# Patient Record
Sex: Female | Born: 1970
Health system: Southern US, Community
[De-identification: ages and names within clinical notes are randomized; demographics above are authoritative.]

## PROBLEM LIST (undated history)

## (undated) DIAGNOSIS — R0602 Shortness of breath: Secondary | ICD-10-CM

## (undated) DIAGNOSIS — Z8614 Personal history of Methicillin resistant Staphylococcus aureus infection: Secondary | ICD-10-CM

## (undated) DIAGNOSIS — T4145XA Adverse effect of unspecified anesthetic, initial encounter: Secondary | ICD-10-CM

## (undated) DIAGNOSIS — A6009 Herpesviral infection of other urogenital tract: Secondary | ICD-10-CM

## (undated) DIAGNOSIS — T8859XA Other complications of anesthesia, initial encounter: Secondary | ICD-10-CM

## (undated) DIAGNOSIS — R112 Nausea with vomiting, unspecified: Secondary | ICD-10-CM

## (undated) DIAGNOSIS — D869 Sarcoidosis, unspecified: Secondary | ICD-10-CM

## (undated) DIAGNOSIS — Z9889 Other specified postprocedural states: Secondary | ICD-10-CM

## (undated) DIAGNOSIS — R011 Cardiac murmur, unspecified: Secondary | ICD-10-CM

## (undated) DIAGNOSIS — M1990 Unspecified osteoarthritis, unspecified site: Secondary | ICD-10-CM

## (undated) DIAGNOSIS — E739 Lactose intolerance, unspecified: Secondary | ICD-10-CM

## (undated) DIAGNOSIS — K219 Gastro-esophageal reflux disease without esophagitis: Secondary | ICD-10-CM

## (undated) DIAGNOSIS — D649 Anemia, unspecified: Secondary | ICD-10-CM

## (undated) HISTORY — DX: Sarcoidosis, unspecified: D86.9

## (undated) HISTORY — PX: COLONOSCOPY: SHX174

## (undated) HISTORY — DX: Anemia, unspecified: D64.9

## (undated) HISTORY — DX: Shortness of breath: R06.02

## (undated) HISTORY — DX: Unspecified osteoarthritis, unspecified site: M19.90

## (undated) HISTORY — PX: TUBAL LIGATION: SHX77

## (undated) HISTORY — DX: Cardiac murmur, unspecified: R01.1

## (undated) HISTORY — DX: Herpesviral infection of other urogenital tract: A60.09

## (undated) HISTORY — DX: Personal history of Methicillin resistant Staphylococcus aureus infection: Z86.14

## (undated) HISTORY — DX: Lactose intolerance, unspecified: E73.9

## (undated) HISTORY — DX: Gastro-esophageal reflux disease without esophagitis: K21.9

---
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
## 1991-03-06 DIAGNOSIS — D869 Sarcoidosis, unspecified: Secondary | ICD-10-CM

## 1991-03-06 HISTORY — PX: LYMPH NODE BIOPSY: SHX201

## 1991-03-06 HISTORY — DX: Sarcoidosis, unspecified: D86.9

## 1997-06-10 ENCOUNTER — Encounter: Admission: RE | Admit: 1997-06-10 | Discharge: 1997-06-10 | Payer: Self-pay | Admitting: Hematology and Oncology

## 1997-07-12 ENCOUNTER — Encounter: Admission: RE | Admit: 1997-07-12 | Discharge: 1997-07-12 | Payer: Self-pay | Admitting: Internal Medicine

## 1997-07-12 ENCOUNTER — Other Ambulatory Visit: Admission: RE | Admit: 1997-07-12 | Discharge: 1997-07-12 | Payer: Self-pay

## 1997-08-30 ENCOUNTER — Encounter: Admission: RE | Admit: 1997-08-30 | Discharge: 1997-08-30 | Payer: Self-pay | Admitting: Internal Medicine

## 1997-11-05 ENCOUNTER — Encounter: Admission: RE | Admit: 1997-11-05 | Discharge: 1997-11-05 | Payer: Self-pay | Admitting: Internal Medicine

## 1998-01-18 ENCOUNTER — Encounter: Admission: RE | Admit: 1998-01-18 | Discharge: 1998-01-18 | Payer: Self-pay | Admitting: Hematology and Oncology

## 1998-04-08 ENCOUNTER — Encounter: Payer: Self-pay | Admitting: Emergency Medicine

## 1998-04-08 ENCOUNTER — Emergency Department (HOSPITAL_COMMUNITY): Admission: EM | Admit: 1998-04-08 | Discharge: 1998-04-08 | Payer: Self-pay | Admitting: Emergency Medicine

## 1998-04-10 ENCOUNTER — Emergency Department (HOSPITAL_COMMUNITY): Admission: EM | Admit: 1998-04-10 | Discharge: 1998-04-10 | Payer: Self-pay | Admitting: Emergency Medicine

## 1998-04-11 ENCOUNTER — Emergency Department (HOSPITAL_COMMUNITY): Admission: EM | Admit: 1998-04-11 | Discharge: 1998-04-11 | Payer: Self-pay | Admitting: Internal Medicine

## 1998-04-12 ENCOUNTER — Inpatient Hospital Stay (HOSPITAL_COMMUNITY): Admission: EM | Admit: 1998-04-12 | Discharge: 1998-04-14 | Payer: Self-pay | Admitting: Emergency Medicine

## 1998-04-15 ENCOUNTER — Encounter (HOSPITAL_COMMUNITY): Admission: RE | Admit: 1998-04-15 | Discharge: 1998-07-14 | Payer: Self-pay

## 1998-04-28 ENCOUNTER — Encounter: Admission: RE | Admit: 1998-04-28 | Discharge: 1998-04-28 | Payer: Self-pay | Admitting: Hematology and Oncology

## 1998-05-04 ENCOUNTER — Encounter: Admission: RE | Admit: 1998-05-04 | Discharge: 1998-05-04 | Payer: Self-pay | Admitting: Internal Medicine

## 1998-05-06 ENCOUNTER — Encounter: Admission: RE | Admit: 1998-05-06 | Discharge: 1998-05-06 | Payer: Self-pay | Admitting: Internal Medicine

## 1998-05-31 ENCOUNTER — Encounter: Admission: RE | Admit: 1998-05-31 | Discharge: 1998-05-31 | Payer: Self-pay | Admitting: Internal Medicine

## 1998-06-09 ENCOUNTER — Encounter: Admission: RE | Admit: 1998-06-09 | Discharge: 1998-06-09 | Payer: Self-pay | Admitting: Internal Medicine

## 1998-07-26 ENCOUNTER — Encounter: Admission: RE | Admit: 1998-07-26 | Discharge: 1998-07-26 | Payer: Self-pay | Admitting: Internal Medicine

## 1998-08-12 ENCOUNTER — Encounter: Admission: RE | Admit: 1998-08-12 | Discharge: 1998-08-12 | Payer: Self-pay | Admitting: Internal Medicine

## 1998-08-30 ENCOUNTER — Encounter: Admission: RE | Admit: 1998-08-30 | Discharge: 1998-11-28 | Payer: Self-pay

## 1998-09-15 ENCOUNTER — Encounter: Admission: RE | Admit: 1998-09-15 | Discharge: 1998-09-15 | Payer: Self-pay | Admitting: Internal Medicine

## 1998-10-06 ENCOUNTER — Encounter: Admission: RE | Admit: 1998-10-06 | Discharge: 1998-10-06 | Payer: Self-pay | Admitting: Hematology and Oncology

## 1998-10-06 ENCOUNTER — Other Ambulatory Visit: Admission: RE | Admit: 1998-10-06 | Discharge: 1998-10-06 | Payer: Self-pay | Admitting: *Deleted

## 1998-11-01 ENCOUNTER — Encounter: Admission: RE | Admit: 1998-11-01 | Discharge: 1998-11-01 | Payer: Self-pay | Admitting: Hematology and Oncology

## 1999-02-09 ENCOUNTER — Encounter: Admission: RE | Admit: 1999-02-09 | Discharge: 1999-02-09 | Payer: Self-pay | Admitting: Hematology and Oncology

## 1999-04-07 ENCOUNTER — Encounter: Admission: RE | Admit: 1999-04-07 | Discharge: 1999-04-07 | Payer: Self-pay | Admitting: Internal Medicine

## 1999-04-20 ENCOUNTER — Encounter: Admission: RE | Admit: 1999-04-20 | Discharge: 1999-04-20 | Payer: Self-pay | Admitting: Internal Medicine

## 1999-05-04 ENCOUNTER — Encounter: Admission: RE | Admit: 1999-05-04 | Discharge: 1999-05-04 | Payer: Self-pay | Admitting: Hematology and Oncology

## 1999-10-06 ENCOUNTER — Encounter: Admission: RE | Admit: 1999-10-06 | Discharge: 1999-10-06 | Payer: Self-pay | Admitting: Internal Medicine

## 1999-11-02 ENCOUNTER — Encounter: Admission: RE | Admit: 1999-11-02 | Discharge: 1999-11-02 | Payer: Self-pay | Admitting: Occupational Medicine

## 1999-11-02 ENCOUNTER — Encounter: Payer: Self-pay | Admitting: Occupational Medicine

## 1999-11-23 ENCOUNTER — Encounter: Admission: RE | Admit: 1999-11-23 | Discharge: 1999-11-23 | Payer: Self-pay | Admitting: Internal Medicine

## 2000-01-12 ENCOUNTER — Other Ambulatory Visit: Admission: RE | Admit: 2000-01-12 | Discharge: 2000-01-12 | Payer: Self-pay | Admitting: *Deleted

## 2000-01-12 ENCOUNTER — Encounter: Admission: RE | Admit: 2000-01-12 | Discharge: 2000-01-12 | Payer: Self-pay

## 2000-03-25 ENCOUNTER — Encounter: Admission: RE | Admit: 2000-03-25 | Discharge: 2000-03-25 | Payer: Self-pay | Admitting: Internal Medicine

## 2000-06-11 ENCOUNTER — Encounter: Admission: RE | Admit: 2000-06-11 | Discharge: 2000-06-11 | Payer: Self-pay | Admitting: Hematology and Oncology

## 2000-06-14 ENCOUNTER — Ambulatory Visit (HOSPITAL_COMMUNITY): Admission: RE | Admit: 2000-06-14 | Discharge: 2000-06-14 | Payer: Self-pay

## 2000-06-14 ENCOUNTER — Encounter: Admission: RE | Admit: 2000-06-14 | Discharge: 2000-06-14 | Payer: Self-pay

## 2000-09-07 ENCOUNTER — Emergency Department (HOSPITAL_COMMUNITY): Admission: EM | Admit: 2000-09-07 | Discharge: 2000-09-07 | Payer: Self-pay | Admitting: Emergency Medicine

## 2000-09-13 ENCOUNTER — Encounter: Admission: RE | Admit: 2000-09-13 | Discharge: 2000-09-13 | Payer: Self-pay | Admitting: Internal Medicine

## 2000-09-30 ENCOUNTER — Encounter: Admission: RE | Admit: 2000-09-30 | Discharge: 2000-09-30 | Payer: Self-pay | Admitting: Internal Medicine

## 2000-10-07 ENCOUNTER — Encounter: Admission: RE | Admit: 2000-10-07 | Discharge: 2000-10-07 | Payer: Self-pay | Admitting: Internal Medicine

## 2000-10-17 ENCOUNTER — Encounter: Admission: RE | Admit: 2000-10-17 | Discharge: 2000-10-17 | Payer: Self-pay | Admitting: Internal Medicine

## 2000-11-12 ENCOUNTER — Encounter: Admission: RE | Admit: 2000-11-12 | Discharge: 2000-11-12 | Payer: Self-pay | Admitting: Pediatrics

## 2001-01-23 ENCOUNTER — Encounter: Admission: RE | Admit: 2001-01-23 | Discharge: 2001-01-23 | Payer: Self-pay | Admitting: Internal Medicine

## 2001-01-23 ENCOUNTER — Other Ambulatory Visit: Admission: RE | Admit: 2001-01-23 | Discharge: 2001-01-23 | Payer: Self-pay

## 2001-02-10 ENCOUNTER — Encounter: Admission: RE | Admit: 2001-02-10 | Discharge: 2001-02-10 | Payer: Self-pay | Admitting: Internal Medicine

## 2001-02-13 ENCOUNTER — Encounter: Admission: RE | Admit: 2001-02-13 | Discharge: 2001-02-13 | Payer: Self-pay | Admitting: Internal Medicine

## 2001-03-19 ENCOUNTER — Encounter: Admission: RE | Admit: 2001-03-19 | Discharge: 2001-03-19 | Payer: Self-pay

## 2001-04-22 ENCOUNTER — Encounter: Admission: RE | Admit: 2001-04-22 | Discharge: 2001-04-22 | Payer: Self-pay | Admitting: Internal Medicine

## 2001-05-01 ENCOUNTER — Encounter: Payer: Self-pay | Admitting: Gastroenterology

## 2001-05-01 ENCOUNTER — Encounter: Admission: RE | Admit: 2001-05-01 | Discharge: 2001-05-01 | Payer: Self-pay | Admitting: Gastroenterology

## 2001-06-03 ENCOUNTER — Emergency Department (HOSPITAL_COMMUNITY): Admission: EM | Admit: 2001-06-03 | Discharge: 2001-06-03 | Payer: Self-pay | Admitting: Emergency Medicine

## 2001-06-03 ENCOUNTER — Encounter: Payer: Self-pay | Admitting: Emergency Medicine

## 2001-06-06 ENCOUNTER — Encounter: Admission: RE | Admit: 2001-06-06 | Discharge: 2001-06-06 | Payer: Self-pay | Admitting: Internal Medicine

## 2001-06-10 ENCOUNTER — Encounter: Admission: RE | Admit: 2001-06-10 | Discharge: 2001-06-10 | Payer: Self-pay

## 2001-07-02 ENCOUNTER — Encounter: Admission: RE | Admit: 2001-07-02 | Discharge: 2001-07-02 | Payer: Self-pay | Admitting: *Deleted

## 2001-07-22 ENCOUNTER — Encounter: Admission: RE | Admit: 2001-07-22 | Discharge: 2001-07-22 | Payer: Self-pay | Admitting: Family Medicine

## 2001-07-22 ENCOUNTER — Encounter: Payer: Self-pay | Admitting: Family Medicine

## 2001-07-24 ENCOUNTER — Encounter: Admission: RE | Admit: 2001-07-24 | Discharge: 2001-07-31 | Payer: Self-pay | Admitting: Occupational Medicine

## 2001-08-18 ENCOUNTER — Ambulatory Visit (HOSPITAL_COMMUNITY): Admission: RE | Admit: 2001-08-18 | Discharge: 2001-08-18 | Payer: Self-pay | Admitting: Gastroenterology

## 2001-08-18 ENCOUNTER — Encounter (INDEPENDENT_AMBULATORY_CARE_PROVIDER_SITE_OTHER): Payer: Self-pay | Admitting: *Deleted

## 2001-08-29 ENCOUNTER — Encounter: Admission: RE | Admit: 2001-08-29 | Discharge: 2001-08-29 | Payer: Self-pay | Admitting: Internal Medicine

## 2002-01-05 ENCOUNTER — Encounter: Admission: RE | Admit: 2002-01-05 | Discharge: 2002-01-05 | Payer: Self-pay | Admitting: Internal Medicine

## 2002-02-10 ENCOUNTER — Encounter: Admission: RE | Admit: 2002-02-10 | Discharge: 2002-02-10 | Payer: Self-pay | Admitting: Internal Medicine

## 2002-04-27 ENCOUNTER — Encounter: Admission: RE | Admit: 2002-04-27 | Discharge: 2002-04-27 | Payer: Self-pay | Admitting: Internal Medicine

## 2002-09-22 ENCOUNTER — Encounter: Admission: RE | Admit: 2002-09-22 | Discharge: 2002-09-22 | Payer: Self-pay | Admitting: Occupational Medicine

## 2002-09-22 ENCOUNTER — Encounter: Payer: Self-pay | Admitting: Occupational Medicine

## 2002-12-11 ENCOUNTER — Encounter: Admission: RE | Admit: 2002-12-11 | Discharge: 2002-12-11 | Payer: Self-pay | Admitting: Internal Medicine

## 2003-04-05 ENCOUNTER — Encounter: Admission: RE | Admit: 2003-04-05 | Discharge: 2003-04-05 | Payer: Self-pay | Admitting: Internal Medicine

## 2003-05-06 ENCOUNTER — Encounter: Admission: RE | Admit: 2003-05-06 | Discharge: 2003-05-06 | Payer: Self-pay | Admitting: Internal Medicine

## 2003-07-22 ENCOUNTER — Encounter: Admission: RE | Admit: 2003-07-22 | Discharge: 2003-07-22 | Payer: Self-pay | Admitting: Gastroenterology

## 2003-09-22 ENCOUNTER — Emergency Department (HOSPITAL_COMMUNITY): Admission: EM | Admit: 2003-09-22 | Discharge: 2003-09-22 | Payer: Self-pay | Admitting: Family Medicine

## 2003-10-12 ENCOUNTER — Ambulatory Visit (HOSPITAL_COMMUNITY): Admission: RE | Admit: 2003-10-12 | Discharge: 2003-10-12 | Payer: Self-pay | Admitting: Gastroenterology

## 2003-10-18 ENCOUNTER — Encounter: Admission: RE | Admit: 2003-10-18 | Discharge: 2003-10-18 | Payer: Self-pay | Admitting: Internal Medicine

## 2003-11-16 ENCOUNTER — Ambulatory Visit: Payer: Self-pay | Admitting: Internal Medicine

## 2003-12-28 ENCOUNTER — Ambulatory Visit: Payer: Self-pay | Admitting: Internal Medicine

## 2004-03-20 ENCOUNTER — Emergency Department (HOSPITAL_COMMUNITY): Admission: EM | Admit: 2004-03-20 | Discharge: 2004-03-20 | Payer: Self-pay | Admitting: Family Medicine

## 2004-04-13 ENCOUNTER — Ambulatory Visit: Payer: Self-pay | Admitting: Internal Medicine

## 2004-04-17 ENCOUNTER — Ambulatory Visit (HOSPITAL_COMMUNITY): Admission: RE | Admit: 2004-04-17 | Discharge: 2004-04-17 | Payer: Self-pay | Admitting: Orthopaedic Surgery

## 2004-08-03 ENCOUNTER — Ambulatory Visit: Payer: Self-pay | Admitting: Internal Medicine

## 2004-08-24 ENCOUNTER — Encounter: Admission: RE | Admit: 2004-08-24 | Discharge: 2004-08-24 | Payer: Self-pay | Admitting: Occupational Medicine

## 2004-09-20 ENCOUNTER — Ambulatory Visit: Payer: Self-pay | Admitting: Internal Medicine

## 2005-03-29 ENCOUNTER — Ambulatory Visit: Payer: Self-pay | Admitting: Internal Medicine

## 2005-03-30 ENCOUNTER — Encounter (INDEPENDENT_AMBULATORY_CARE_PROVIDER_SITE_OTHER): Payer: Self-pay | Admitting: Infectious Diseases

## 2005-04-02 ENCOUNTER — Ambulatory Visit (HOSPITAL_COMMUNITY): Admission: RE | Admit: 2005-04-02 | Discharge: 2005-04-02 | Payer: Self-pay | Admitting: Pulmonary Disease

## 2005-04-04 ENCOUNTER — Ambulatory Visit: Payer: Self-pay | Admitting: Internal Medicine

## 2005-08-15 ENCOUNTER — Ambulatory Visit: Payer: Self-pay | Admitting: Internal Medicine

## 2005-12-16 ENCOUNTER — Emergency Department (HOSPITAL_COMMUNITY): Admission: EM | Admit: 2005-12-16 | Discharge: 2005-12-16 | Payer: Self-pay | Admitting: Emergency Medicine

## 2006-01-11 ENCOUNTER — Encounter (INDEPENDENT_AMBULATORY_CARE_PROVIDER_SITE_OTHER): Payer: Self-pay | Admitting: Unknown Physician Specialty

## 2006-01-11 ENCOUNTER — Ambulatory Visit: Payer: Self-pay | Admitting: Internal Medicine

## 2006-01-11 LAB — CONVERTED CEMR LAB: Candida species: POSITIVE — AB

## 2006-01-20 ENCOUNTER — Emergency Department (HOSPITAL_COMMUNITY): Admission: EM | Admit: 2006-01-20 | Discharge: 2006-01-20 | Payer: Self-pay | Admitting: Emergency Medicine

## 2006-04-03 ENCOUNTER — Ambulatory Visit: Payer: Self-pay | Admitting: Internal Medicine

## 2006-04-03 ENCOUNTER — Encounter (INDEPENDENT_AMBULATORY_CARE_PROVIDER_SITE_OTHER): Payer: Self-pay | Admitting: Infectious Diseases

## 2006-04-03 DIAGNOSIS — M7710 Lateral epicondylitis, unspecified elbow: Secondary | ICD-10-CM | POA: Insufficient documentation

## 2006-04-03 DIAGNOSIS — F3289 Other specified depressive episodes: Secondary | ICD-10-CM | POA: Insufficient documentation

## 2006-04-03 DIAGNOSIS — G4700 Insomnia, unspecified: Secondary | ICD-10-CM | POA: Insufficient documentation

## 2006-04-03 DIAGNOSIS — Z8719 Personal history of other diseases of the digestive system: Secondary | ICD-10-CM | POA: Insufficient documentation

## 2006-04-03 DIAGNOSIS — F17200 Nicotine dependence, unspecified, uncomplicated: Secondary | ICD-10-CM | POA: Insufficient documentation

## 2006-04-03 DIAGNOSIS — D649 Anemia, unspecified: Secondary | ICD-10-CM | POA: Insufficient documentation

## 2006-04-03 DIAGNOSIS — G44209 Tension-type headache, unspecified, not intractable: Secondary | ICD-10-CM | POA: Insufficient documentation

## 2006-04-03 DIAGNOSIS — D869 Sarcoidosis, unspecified: Secondary | ICD-10-CM | POA: Insufficient documentation

## 2006-04-03 DIAGNOSIS — F329 Major depressive disorder, single episode, unspecified: Secondary | ICD-10-CM | POA: Insufficient documentation

## 2006-04-03 DIAGNOSIS — K449 Diaphragmatic hernia without obstruction or gangrene: Secondary | ICD-10-CM | POA: Insufficient documentation

## 2006-04-03 LAB — CONVERTED CEMR LAB
ALT: 13 units/L (ref 0–35)
AST: 26 units/L (ref 0–37)
Alkaline Phosphatase: 40 units/L (ref 39–117)
Creatinine, Ser: 0.89 mg/dL (ref 0.40–1.20)
HCT: 38.6 % (ref 36.0–46.0)
MCHC: 30.6 g/dL (ref 30.0–36.0)
MCV: 83 fL (ref 78.0–100.0)
Platelets: 439 10*3/uL — ABNORMAL HIGH (ref 150–400)
RDW: 15.4 % — ABNORMAL HIGH (ref 11.5–14.0)
Total Bilirubin: 0.3 mg/dL (ref 0.3–1.2)

## 2006-06-03 ENCOUNTER — Encounter: Payer: Self-pay | Admitting: Internal Medicine

## 2006-06-03 ENCOUNTER — Ambulatory Visit (HOSPITAL_COMMUNITY): Admission: RE | Admit: 2006-06-03 | Discharge: 2006-06-03 | Payer: Self-pay | Admitting: Family Medicine

## 2006-08-08 ENCOUNTER — Telehealth (INDEPENDENT_AMBULATORY_CARE_PROVIDER_SITE_OTHER): Payer: Self-pay | Admitting: Pharmacy Technician

## 2006-08-27 ENCOUNTER — Ambulatory Visit: Payer: Self-pay | Admitting: Internal Medicine

## 2006-08-27 DIAGNOSIS — J351 Hypertrophy of tonsils: Secondary | ICD-10-CM | POA: Insufficient documentation

## 2007-01-17 ENCOUNTER — Telehealth (INDEPENDENT_AMBULATORY_CARE_PROVIDER_SITE_OTHER): Payer: Self-pay | Admitting: Infectious Diseases

## 2007-03-06 DIAGNOSIS — Z8614 Personal history of Methicillin resistant Staphylococcus aureus infection: Secondary | ICD-10-CM

## 2007-03-06 HISTORY — DX: Personal history of Methicillin resistant Staphylococcus aureus infection: Z86.14

## 2007-04-10 ENCOUNTER — Telehealth (INDEPENDENT_AMBULATORY_CARE_PROVIDER_SITE_OTHER): Payer: Self-pay | Admitting: Infectious Diseases

## 2007-04-28 ENCOUNTER — Encounter: Payer: Self-pay | Admitting: Internal Medicine

## 2007-04-28 ENCOUNTER — Ambulatory Visit: Payer: Self-pay | Admitting: Internal Medicine

## 2007-04-28 DIAGNOSIS — E049 Nontoxic goiter, unspecified: Secondary | ICD-10-CM | POA: Insufficient documentation

## 2007-04-28 DIAGNOSIS — R42 Dizziness and giddiness: Secondary | ICD-10-CM | POA: Insufficient documentation

## 2007-04-28 LAB — CONVERTED CEMR LAB
Eosinophils Absolute: 0 10*3/uL (ref 0.0–0.7)
Eosinophils Relative: 1 % (ref 0–5)
HCT: 36 % (ref 36.0–46.0)
Lymphs Abs: 1.9 10*3/uL (ref 0.7–4.0)
MCV: 83.9 fL (ref 78.0–100.0)
Platelets: 491 10*3/uL — ABNORMAL HIGH (ref 150–400)
TSH: 1.163 microintl units/mL (ref 0.350–5.50)
WBC: 5.7 10*3/uL (ref 4.0–10.5)

## 2007-05-19 ENCOUNTER — Emergency Department (HOSPITAL_COMMUNITY): Admission: EM | Admit: 2007-05-19 | Discharge: 2007-05-19 | Payer: Self-pay | Admitting: Emergency Medicine

## 2007-05-20 ENCOUNTER — Other Ambulatory Visit: Admission: RE | Admit: 2007-05-20 | Discharge: 2007-05-20 | Payer: Self-pay | Admitting: Obstetrics and Gynecology

## 2007-06-02 ENCOUNTER — Telehealth (INDEPENDENT_AMBULATORY_CARE_PROVIDER_SITE_OTHER): Payer: Self-pay | Admitting: Infectious Diseases

## 2007-06-06 ENCOUNTER — Telehealth (INDEPENDENT_AMBULATORY_CARE_PROVIDER_SITE_OTHER): Payer: Self-pay | Admitting: Infectious Diseases

## 2007-08-12 ENCOUNTER — Telehealth (INDEPENDENT_AMBULATORY_CARE_PROVIDER_SITE_OTHER): Payer: Self-pay | Admitting: Infectious Diseases

## 2007-12-03 ENCOUNTER — Encounter: Payer: Self-pay | Admitting: Internal Medicine

## 2007-12-10 ENCOUNTER — Ambulatory Visit: Payer: Self-pay | Admitting: Infectious Disease

## 2008-01-15 ENCOUNTER — Other Ambulatory Visit: Admission: RE | Admit: 2008-01-15 | Discharge: 2008-01-15 | Payer: Self-pay | Admitting: Obstetrics and Gynecology

## 2008-02-24 ENCOUNTER — Telehealth: Payer: Self-pay | Admitting: Internal Medicine

## 2008-03-05 DIAGNOSIS — A6009 Herpesviral infection of other urogenital tract: Secondary | ICD-10-CM

## 2008-03-05 HISTORY — DX: Herpesviral infection of other urogenital tract: A60.09

## 2008-11-29 ENCOUNTER — Ambulatory Visit (HOSPITAL_COMMUNITY): Admission: RE | Admit: 2008-11-29 | Discharge: 2008-11-29 | Payer: Self-pay | Admitting: Sports Medicine

## 2009-01-28 IMAGING — CT CT ABD-PELV W/O CM
3 of 8 series · 12 of 42 positions shown, 18 images · IV contrast (CONTRAST)
Comparison: NONE

CLINICAL DATA: Right lower quadrant pain times 2 days. 

CT ABDOMEN AND PELVIS WITHOUT AND WITH INTRAVENOUS AND FOLLOWING 
ORAL  CONTRAST
TECHNIQUE: Multiple axial 5.0-mm-thick slices at 5.0-mm 
intervals were obtained from the lung base through the pelvis 
following the intravenous administration of 125 cc of Optiray 350 
at a rate of 3 cc per second.  Oral contrast was administered as 
well.  Arterial and venous phase imaging was obtained in the upper 
abdomen with delayed images obtained through the pelvis.

[Series 4: venous · axial · portal-venous · 0.67mm/px · z∈[+109,+384]mm · 5 of 83 slices shown, 10 images]
[im 14/83  soft-tissue]
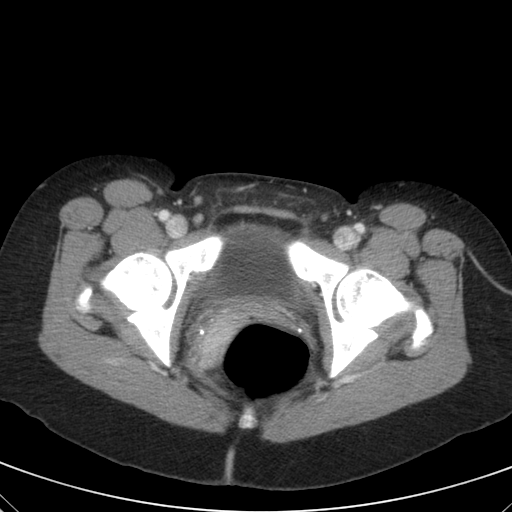
[im 14/83  bone]
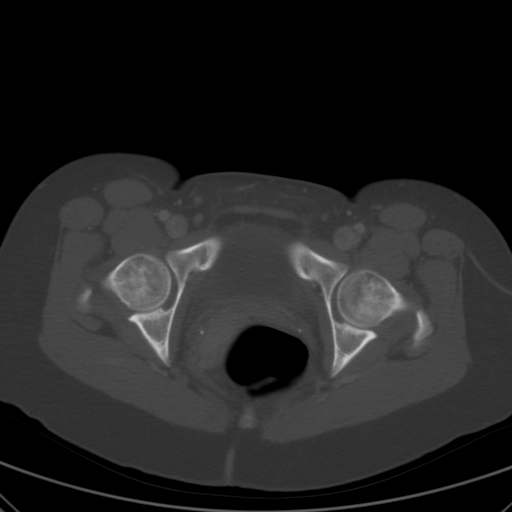
[im 28/83  soft-tissue]
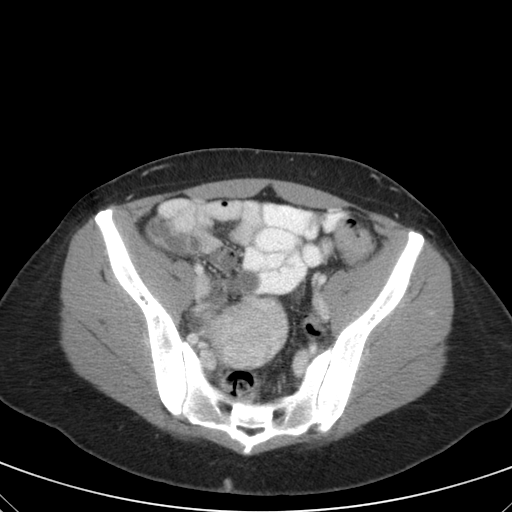
[im 28/83  lung]
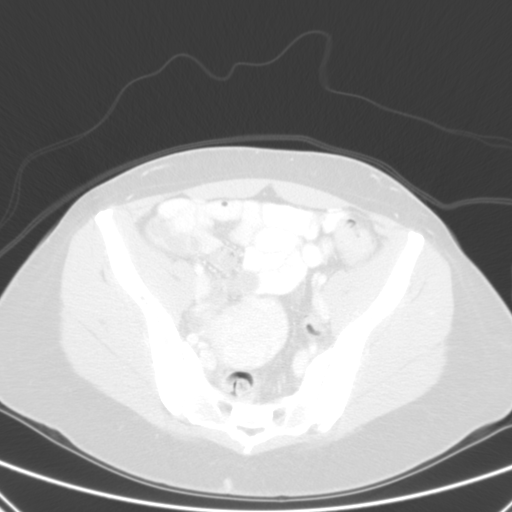
[im 42/83  soft-tissue]
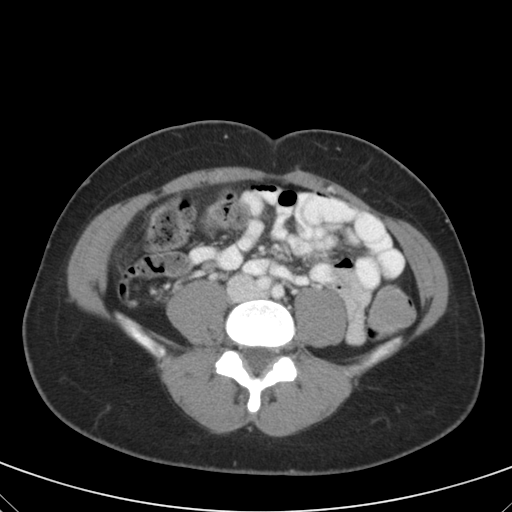
[im 42/83  lung]
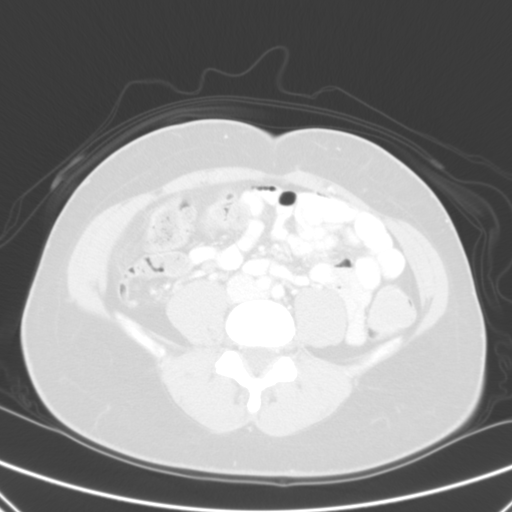
[im 55/83  soft-tissue]
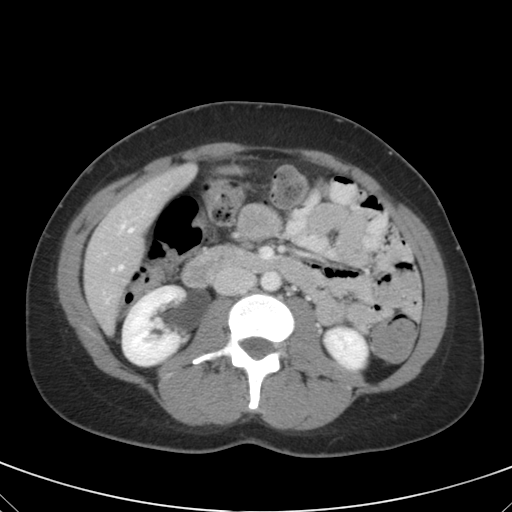
[im 55/83  lung]
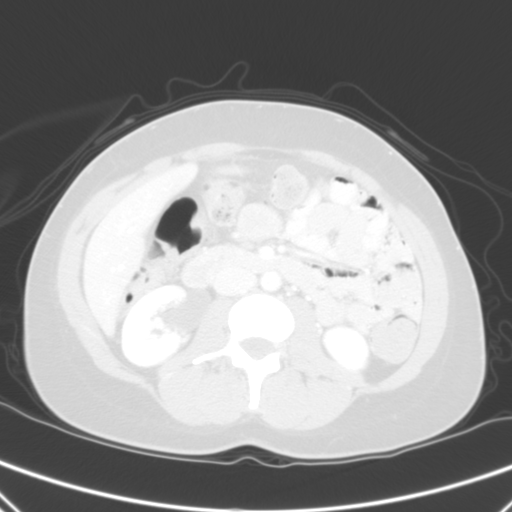
[im 69/83  soft-tissue]
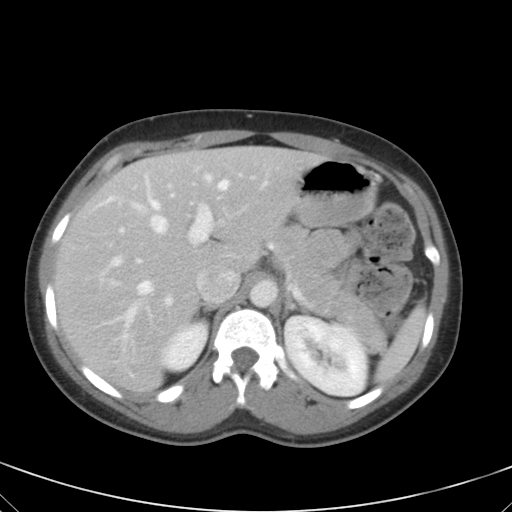
[im 69/83  lung]
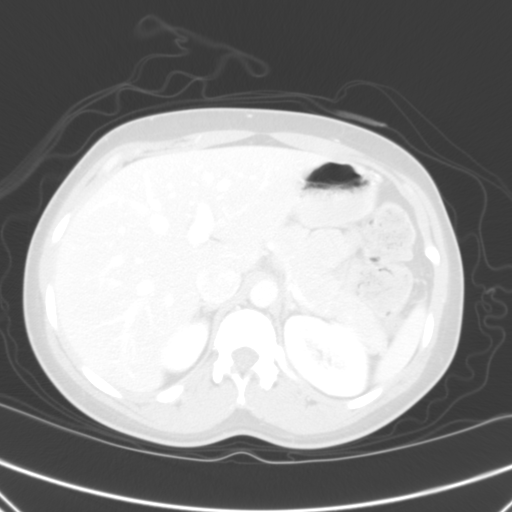

[Series 9: delays · axial · 0.65mm/px · z∈[+145,+340]mm · 4 of 67 slices shown]
[im 14/67  soft-tissue]
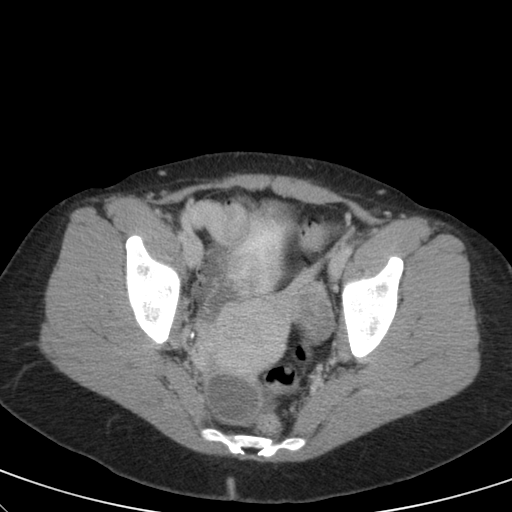
[im 27/67  soft-tissue]
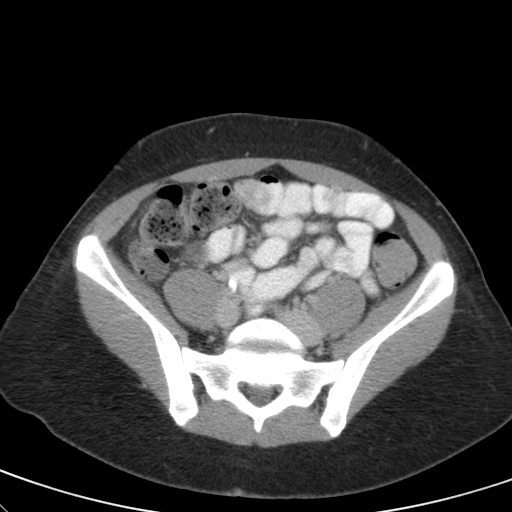
[im 40/67  soft-tissue]
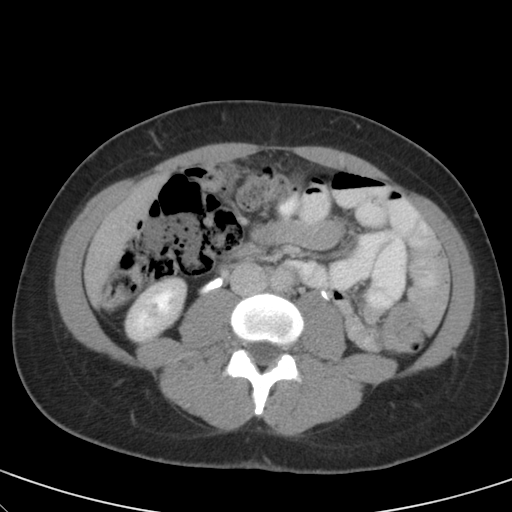
[im 53/67  soft-tissue]
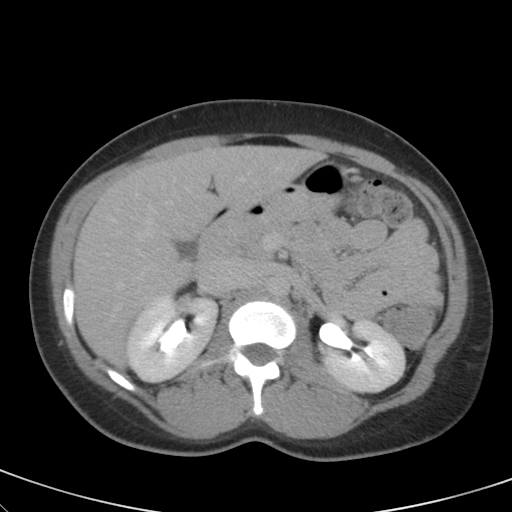

[coronals · coronal · 0.80mm/px · 3 of 70 slices shown, 4 images]
[im 24/70  soft-tissue]
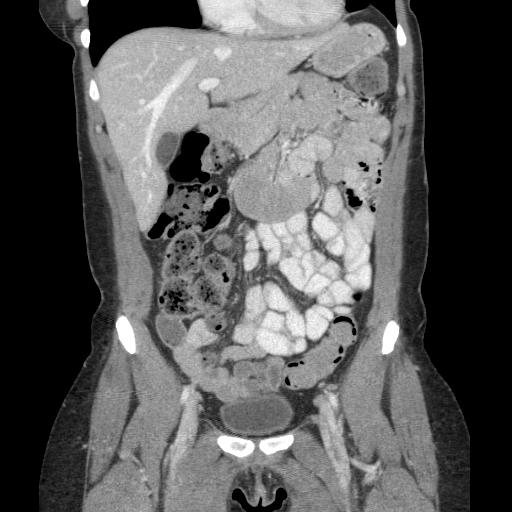
[im 31/70  soft-tissue]
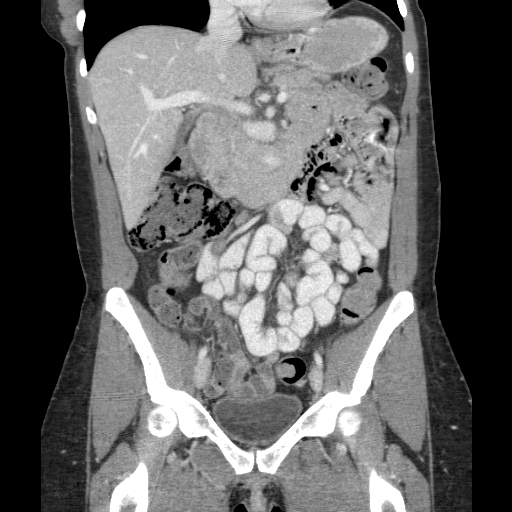
[im 31/70  bone]
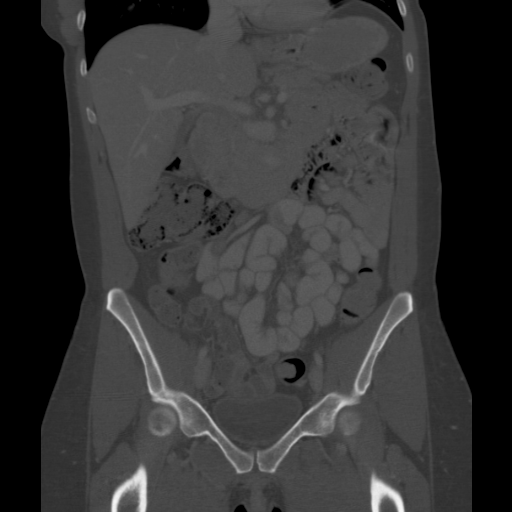
[im 39/70  soft-tissue]
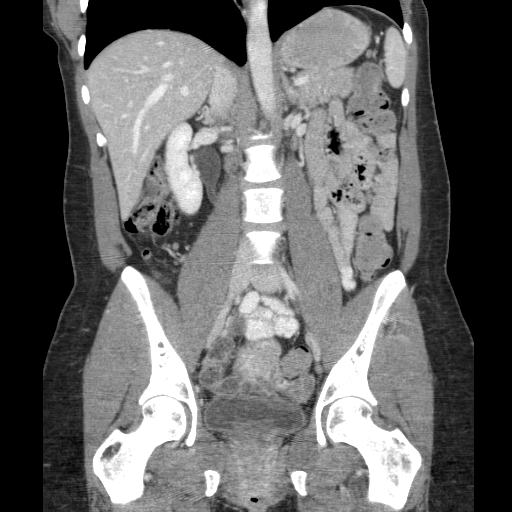

[12 of 42 positions shown; findings below may reference images not displayed]

FINDINGS: No gallstones or renal calculi.  No upper abdominal or 
retroperitoneal mass, adenopathy, or aneurysm.  No adrenal or 
renal mass or evidence of renal obstruction. The liver, pancreas, 
and spleen are unremarkable. No bowel, mesenteric, pelvic, or 
inguinal mass, adenopathy, or inflammatory process except for 
bilateral ovarian cysts; the largest on the right, 3.6 cm and the 
smaller on the left, approximately 7.6x7.4 cm.  No free fluid or 
cul-de-sac masses. No evidence of appendicitis, diverticulitis, 
hernia, or bowel obstruction.
IMPRESSION: Bilateral adnexal cysts, right greater than left.  No 
evidence of free fluid or inflammatory process. Nevison, Ackeno Date:  05/21/2007 DAS  JLM

## 2009-06-29 ENCOUNTER — Encounter (INDEPENDENT_AMBULATORY_CARE_PROVIDER_SITE_OTHER): Payer: Self-pay | Admitting: *Deleted

## 2009-06-29 ENCOUNTER — Telehealth (INDEPENDENT_AMBULATORY_CARE_PROVIDER_SITE_OTHER): Payer: Self-pay | Admitting: *Deleted

## 2010-02-20 ENCOUNTER — Encounter
Admission: RE | Admit: 2010-02-20 | Discharge: 2010-02-20 | Payer: Self-pay | Source: Home / Self Care | Attending: Obstetrics and Gynecology | Admitting: Obstetrics and Gynecology

## 2010-03-25 ENCOUNTER — Encounter: Payer: Self-pay | Admitting: Internal Medicine

## 2010-03-26 ENCOUNTER — Encounter: Payer: Self-pay | Admitting: Family Medicine

## 2010-03-27 ENCOUNTER — Encounter: Payer: Self-pay | Admitting: Infectious Diseases

## 2010-04-04 NOTE — Letter (Signed)
Summary: [facility] RECALL LETTER  All     ,     Phone:   Fax:     06/29/2009   Kathleen Vang [address] [place], [place]  [address]   Dear  [name],   You are due to follow-up with a doctor at the [facility].  We have been unable to contact you by phone.  If you would like to schedule a visit, please call [phone].  If you are receiving your health care somewhere else, please call us and we will take your name off our patient list.  Healthy regards,  Raynaldo Opitz, Director [facility]

## 2010-04-04 NOTE — Progress Notes (Signed)
  Phone Note Outgoing Call   Call placed by: Gentry Fitz,  June 29, 2009 1:57 PM Call placed to: Patient Summary of Call: We attempted to call patient in order to schedule a return appointment, since her last [facility] office visit was more than one year ago.  Since we were unable to reach patient by phone, a letter was sent asking her to contact us.  Initial call taken by: Gentry Fitz,  June 29, 2009 1:57 PM

## 2010-04-24 ENCOUNTER — Ambulatory Visit (HOSPITAL_COMMUNITY)
Admission: RE | Admit: 2010-04-24 | Discharge: 2010-04-24 | Disposition: A | Discharge status: Home / Self Care | Payer: 59 | Source: Ambulatory Visit | Attending: Obstetrics and Gynecology | Admitting: Obstetrics and Gynecology

## 2010-04-24 ENCOUNTER — Other Ambulatory Visit: Payer: Self-pay | Admitting: Obstetrics and Gynecology

## 2010-04-24 DIAGNOSIS — R1032 Left lower quadrant pain: Secondary | ICD-10-CM | POA: Insufficient documentation

## 2010-04-24 DIAGNOSIS — N83209 Unspecified ovarian cyst, unspecified side: Secondary | ICD-10-CM | POA: Insufficient documentation

## 2010-04-24 DIAGNOSIS — R102 Pelvic and perineal pain: Secondary | ICD-10-CM

## 2010-06-17 ENCOUNTER — Inpatient Hospital Stay (INDEPENDENT_AMBULATORY_CARE_PROVIDER_SITE_OTHER)
Admission: RE | Admit: 2010-06-17 | Discharge: 2010-06-17 | Disposition: A | Discharge status: Home / Self Care | Payer: Commercial Managed Care - PPO | Source: Ambulatory Visit | Attending: Family Medicine | Admitting: Family Medicine

## 2010-06-17 DIAGNOSIS — R51 Headache: Secondary | ICD-10-CM

## 2010-07-21 NOTE — Procedures (Signed)
[place]  Patient:    Kathleen Vang, Kathleen Vang Visit Number: [id number] MRN: [id number]          Service Type: END Location: ENDO Attending Physician:  Nelda Marseille Dictated by:   Petra Kuba, M.D. Proc. Date: 08/18/01 Admit Date:  08/18/2001   CC:         Harvel Quale, M.D., [facility]   Procedure Report  PROCEDURE:  Esophagogastroduodenoscopy with biopsy.  INDICATION:  Patient with long-standing reflux.  Want to rule out Barretts. Consent was signed after risks, benefits, methods, options thoroughly discussed in the office.  MEDICATIONS:  Demerol 60 mg, Versed 5 mg.  DESCRIPTION OF PROCEDURE:  The scope was inserted by direct vision.  The proximal and midesophagus was normal.  In the distal esophagus was a small hiatal hernia with minimal reflux changes but no signs of obvious Barretts. This area was biopsied at the end of the procedure.  The scope passed into the stomach and advanced through a normal antrum and normal pylorus into a normal duodenal bulb and around the C-loop to a normal second portion of the duodenum.  The scope was withdrawn back to the bulb, and a good look there ruled out ulcers in that location.  The scope was withdrawn back to the stomach and retroflexed.  The cardia, fundus, angularis, and lesser and greater curve were normal.  The scope was straightened.  Direct visualization of the stomach was normal.  The scope was slowly withdrawn back to 20 cm.  No additional findings were seen.  We then readvanced down to the distal esophagus.  Biopsies were obtained.  Air was suctioned, the scope slowly withdrawn.  Again a good look at the proximal and midesophagus was normal. The scope was removed.  The patient tolerated the procedure well.  There was no obvious immediate complication.  ENDOSCOPIC DIAGNOSES: 1. Small hiatal hernia with minimal reflux change, status post biopsy. 2. Otherwise normal  EGD.  PLAN:  Await pathology.  Continue pump inhibitors.  Happy to see back p.r.n. or in six months. Dictated by:   Petra Kuba, M.D. Attending Physician:  Nelda Marseille DD:  08/18/01 TD:  08/19/01 Job: [id number]

## 2010-07-21 NOTE — Op Note (Signed)
NAME:  Kathleen Vang, Kathleen Vang                           ACCOUNT NO.:  [id number]   MEDICAL RECORD NO.:  [id number]                   PATIENT TYPE:  AMB   LOCATION:  ENDO                                 FACILITY:  [facility]   PHYSICIAN:  Petra Kuba, M.D.                 DATE OF BIRTH:  [birth year]   DATE OF PROCEDURE:  10/12/2003  DATE OF DISCHARGE:  10/12/2003                                 OPERATIVE REPORT   PROCEDURE:  Manometry.   INDICATIONS:  Preoperative hiatal hernia, considering repair.   PROCEDURE:  Manometry was done in the customary fashion in the Endo Unit.  The computer-generated results were submitted to me for my interpretation.   ASSESSMENT:  1. Upper esophageal sphincter had normal pressures and normal residual     pressure; 84% relaxation.  There was a slight decrease in that duration,     with 379 msec versus a normal of 400 msec.  She had normal peak     pharyngeal pressures.  2. Esophageal body:     a. Upper with normal amplitude and duration.     b. Lower with normal amplitude and overall normal duration.  One of the        distal measurements was slightly prolonged minimally.  There was        normal velocity. She did have 70% peristalsis, with 10% simultaneous        waves and 20% retrogrades; but, adequate wave forms.  3. LAS with normal pressure and normal residual pressure, with 87%     relaxation.   IMPRESSION:  Essentially normal manometry with very minimal and without  significant abnormalities.   PLAN:  Okay for surgical options.  We will have her follow up with me p.r.n.  or if she would like to discuss it further, and otherwise with Dr.  Abbey Chatters for any other preop workup and plan.                                               Petra Kuba, M.D.    MEM/MEDQ  D:  10/25/2003  T:  10/25/2003  Job:  [id number]   cc:   Adolph Pollack, M.D.  [address]  [place]  [place] [address]  Fax: [phone]

## 2010-10-27 ENCOUNTER — Other Ambulatory Visit: Payer: Self-pay | Admitting: Obstetrics and Gynecology

## 2010-10-27 ENCOUNTER — Ambulatory Visit (HOSPITAL_COMMUNITY)
Admission: RE | Admit: 2010-10-27 | Discharge: 2010-10-27 | Disposition: A | Discharge status: Home / Self Care | Payer: 59 | Source: Ambulatory Visit | Attending: Obstetrics and Gynecology | Admitting: Obstetrics and Gynecology

## 2010-10-27 DIAGNOSIS — N831 Corpus luteum cyst of ovary, unspecified side: Secondary | ICD-10-CM | POA: Insufficient documentation

## 2010-10-27 DIAGNOSIS — R1031 Right lower quadrant pain: Secondary | ICD-10-CM

## 2010-11-27 LAB — POCT URINALYSIS DIP (DEVICE)
Bilirubin Urine: NEGATIVE
Glucose, UA: NEGATIVE
Specific Gravity, Urine: 1.015
Urobilinogen, UA: 0.2

## 2010-12-14 ENCOUNTER — Inpatient Hospital Stay (INDEPENDENT_AMBULATORY_CARE_PROVIDER_SITE_OTHER)
Admission: RE | Admit: 2010-12-14 | Discharge: 2010-12-14 | Disposition: A | Discharge status: Home / Self Care | Payer: Commercial Managed Care - PPO | Source: Ambulatory Visit | Attending: Emergency Medicine | Admitting: Emergency Medicine

## 2010-12-14 DIAGNOSIS — J019 Acute sinusitis, unspecified: Secondary | ICD-10-CM

## 2010-12-14 DIAGNOSIS — J310 Chronic rhinitis: Secondary | ICD-10-CM

## 2011-01-16 ENCOUNTER — Other Ambulatory Visit: Payer: Self-pay | Admitting: Certified Nurse Midwife

## 2011-01-16 DIAGNOSIS — Z1231 Encounter for screening mammogram for malignant neoplasm of breast: Secondary | ICD-10-CM

## 2011-01-18 ENCOUNTER — Ambulatory Visit (HOSPITAL_COMMUNITY): Payer: Commercial Managed Care - PPO

## 2011-02-22 ENCOUNTER — Ambulatory Visit (HOSPITAL_COMMUNITY): Payer: 59

## 2011-03-09 ENCOUNTER — Emergency Department (INDEPENDENT_AMBULATORY_CARE_PROVIDER_SITE_OTHER)
Admission: EM | Admit: 2011-03-09 | Discharge: 2011-03-09 | Disposition: A | Discharge status: Home / Self Care | Payer: 59 | Source: Home / Self Care | Attending: Family Medicine | Admitting: Family Medicine

## 2011-03-09 ENCOUNTER — Encounter: Payer: Self-pay | Admitting: *Deleted

## 2011-03-09 DIAGNOSIS — J029 Acute pharyngitis, unspecified: Secondary | ICD-10-CM

## 2011-03-09 MED ORDER — METHYLPREDNISOLONE SODIUM SUCC 125 MG IJ SOLR
INTRAMUSCULAR | Status: AC
  Filled 2011-03-09: qty 2

## 2011-03-09 MED ORDER — PREDNISONE 10 MG PO KIT: PACK | ORAL | Status: DC

## 2011-03-09 MED ORDER — METHYLPREDNISOLONE SODIUM SUCC 125 MG IJ SOLR
125.0000 mg | Freq: Once | INTRAMUSCULAR | Status: AC
Start: 2011-03-09 — End: 2011-03-09
  Administered 2011-03-09: 125 mg via INTRAMUSCULAR

## 2011-03-09 MED ORDER — DOXYCYCLINE HYCLATE 100 MG PO CAPS: 100.0000 mg | ORAL_CAPSULE | Freq: Two times a day (BID) | ORAL | Status: AC

## 2011-03-09 NOTE — ED Provider Notes (Addendum)
History     CSN: [id number]  Arrival date & time 03/09/11  0806   First MD Initiated Contact with Patient 03/09/11 [phone]      Chief Complaint  Patient presents with  . Sore Throat    (Consider location/radiation/quality/duration/timing/severity/associated sxs/prior treatment) Patient is a 41 y.o. female presenting with pharyngitis. The history is provided by the patient.  Sore Throat This is a new problem. The current episode started yesterday. The problem has been gradually worsening. Pertinent negatives include no chest pain, no abdominal pain, no headaches and no shortness of breath. The symptoms are aggravated by swallowing and smoking. The symptoms are relieved by nothing.    History reviewed. No pertinent past medical history.  History reviewed. No pertinent past surgical history.  No family history on file.  History  Substance Use Topics  . Smoking status: Current Everyday Smoker -- 0.5 packs/day    Types: Cigarettes  . Smokeless tobacco: Not on file  . Alcohol Use: No    OB History    Grav Para Term Preterm Abortions TAB SAB Ect Mult Living                  Review of Systems  Constitutional: Positive for fever and chills.  HENT: Positive for sore throat.   Respiratory: Negative for shortness of breath.   Cardiovascular: Negative for chest pain.  Gastrointestinal: Negative for abdominal pain.  Neurological: Negative for headaches.    Allergies  Amoxicillin and Penicillins  Home Medications   Current Outpatient Rx  Name Route Sig Dispense Refill  . DOXYCYCLINE HYCLATE 100 MG PO CAPS Oral Take 1 capsule (100 mg total) by mouth 2 (two) times daily. 20 capsule 0  . PREDNISONE 10 MG PO KIT  Take as directed on kit until finished start on sat . 48 kit 0    BP 154/93  Pulse 82  Temp(Src) 99.9 F (37.7 C) (Oral)  Resp 20  SpO2 100%  LMP 02/14/2011  Physical Exam  Nursing note and vitals reviewed. Constitutional: She appears well-developed and  well-nourished.  HENT:  Head: Normocephalic.  Right Ear: External ear normal.  Left Ear: External ear normal.  Mouth/Throat: Posterior oropharyngeal edema and posterior oropharyngeal erythema present.    Eyes: Pupils are equal, round, and reactive to light.  Neck: No tracheal deviation present. No thyromegaly present.  Lymphadenopathy:    She has cervical adenopathy.  Skin: Skin is warm and dry.    ED Course  Procedures (including critical care time)   Labs Reviewed  POCT RAPID STREP A ([facility] CARE ONLY)   No results found.   1. Pharyngitis, acute       MDM  Strep -- neg  Discussed with dr gore----plan as suggested.        Barkley Bruns, MD 03/09/11 1191  Barkley Bruns, MD 03/09/11 [phone]

## 2011-03-09 NOTE — ED Notes (Signed)
Pt c/o "really bad" sorethroat.  Pt crying with the pain.  Onset yesterday.  States she's not sure if she's had a fever, was "cold" all night.  Lymph nodes swollen.  Throat red and swollen, obvious distress with swallowing.

## 2011-11-01 ENCOUNTER — Other Ambulatory Visit: Payer: Self-pay | Admitting: Certified Nurse Midwife

## 2011-11-01 DIAGNOSIS — Z1231 Encounter for screening mammogram for malignant neoplasm of breast: Secondary | ICD-10-CM

## 2011-11-09 ENCOUNTER — Ambulatory Visit (HOSPITAL_COMMUNITY)
Admission: RE | Admit: 2011-11-09 | Discharge: 2011-11-09 | Disposition: A | Discharge status: Home / Self Care | Payer: 59 | Source: Ambulatory Visit | Attending: Certified Nurse Midwife | Admitting: Certified Nurse Midwife

## 2011-11-09 DIAGNOSIS — Z1231 Encounter for screening mammogram for malignant neoplasm of breast: Secondary | ICD-10-CM | POA: Insufficient documentation

## 2012-04-24 ENCOUNTER — Encounter: Payer: Self-pay | Admitting: Certified Nurse Midwife

## 2012-06-04 ENCOUNTER — Ambulatory Visit: Payer: Self-pay | Admitting: Certified Nurse Midwife

## 2012-08-06 ENCOUNTER — Emergency Department (HOSPITAL_COMMUNITY)
Admission: EM | Admit: 2012-08-06 | Discharge: 2012-08-06 | Disposition: A | Discharge status: Home / Self Care | Payer: 59 | Source: Home / Self Care | Attending: Family Medicine | Admitting: Family Medicine

## 2012-08-06 ENCOUNTER — Encounter (HOSPITAL_COMMUNITY): Payer: Self-pay | Admitting: Emergency Medicine

## 2012-08-06 DIAGNOSIS — M26629 Arthralgia of temporomandibular joint, unspecified side: Secondary | ICD-10-CM

## 2012-08-06 MED ORDER — DIAZEPAM 5 MG PO TABS: 5.0000 mg | ORAL_TABLET | Freq: Every evening | ORAL | Status: DC | PRN

## 2012-08-06 MED ORDER — METHYLPREDNISOLONE 4 MG PO KIT: PACK | ORAL | Status: DC

## 2012-08-06 NOTE — ED Notes (Signed)
Pt c/o left ear pain and sore throat since last night. Took ibuprofen and Claritin this morning with mild relief. Hurts to swallow. No fever.  Pt is alert and oriented.

## 2012-08-06 NOTE — ED Provider Notes (Signed)
History     CSN: [id number]  Arrival date & time 08/06/12  1709   First MD Initiated Contact with Patient 08/06/12 1800      Chief Complaint  Patient presents with  . Sore Throat  . Otalgia    (Consider location/radiation/quality/duration/timing/severity/associated sxs/prior treatment) HPI Comments: Pt presents c/o right sided ear and neck pain since yesterday evening.  She feels a pain or pressure that started in front of the ear and has spread back to just behind the ear.  She cannot recall ever having anything like this before.  She does not feel like she is getting sick and denies fever, chills, cough, SOB, sore throat, otorrhea.  Denies seasonal allergies.  She does admit to clenching her jaw and grinding her teeth at night.    Patient is a 42 y.o. female presenting with pharyngitis and ear pain.  Sore Throat Pertinent negatives include no chest pain, no abdominal pain and no shortness of breath.  Otalgia Associated symptoms: no abdominal pain, no cough, no ear discharge, no fever, no hearing loss, no neck pain, no rash, no rhinorrhea and no vomiting     Past Medical History  Diagnosis Date  . Herpes genitalis in women 2010  . MRSA infection greater than 3 months ago 2009  . Sarcoid 1993  . Anemia     Past Surgical History  Procedure Laterality Date  . Tubal ligation    . Lymph node biopsy  1993    Family History  Problem Relation Age of Onset  . Other Mother     History  Substance Use Topics  . Smoking status: Current Every Day Smoker -- 0.25 packs/day    Types: Cigarettes  . Smokeless tobacco: Not on file  . Alcohol Use: 4.2 oz/week    7 Cans of beer per week    OB History   Grav Para Term Preterm Abortions TAB SAB Ect Mult Living   2 2 2       2       Review of Systems  Constitutional: Negative for fever and chills.  HENT: Positive for ear pain and dental problem. Negative for hearing loss, rhinorrhea, neck pain, neck stiffness, postnasal drip, sinus  pressure and ear discharge.   Eyes: Negative for visual disturbance.  Respiratory: Negative for cough and shortness of breath.   Cardiovascular: Negative for chest pain, palpitations and leg swelling.  Gastrointestinal: Negative for nausea, vomiting and abdominal pain.  Endocrine: Negative for polydipsia and polyuria.  Genitourinary: Negative for dysuria, urgency and frequency.  Musculoskeletal: Negative for myalgias and arthralgias.  Skin: Negative for rash.  Neurological: Negative for dizziness, weakness and light-headedness.    Allergies  Amoxicillin and Penicillins  Home Medications   Current Outpatient Rx  Name  Route  Sig  Dispense  Refill  . diazepam (VALIUM) 5 MG tablet   Oral   Take 1 tablet (5 mg total) by mouth at bedtime as needed for anxiety.   14 tablet   0   . methylPREDNISolone (MEDROL DOSEPAK) 4 MG tablet      Use dose packet as directed   21 tablet   0   . PredniSONE 10 MG KIT      Take as directed on kit until finished start on sat .   48 kit   0     BP 129/91  Pulse 84  Temp(Src) 98.6 F (37 C) (Oral)  Resp 16  SpO2 100%  LMP 08/06/2012  Physical Exam  Nursing note  and vitals reviewed. Constitutional: She is oriented to person, place, and time. Vital signs are normal. She appears well-developed and well-nourished. No distress.  HENT:  Head: Atraumatic.  Left TMJ TTP   Eyes: EOM are normal. Pupils are equal, round, and reactive to light.  Cardiovascular: Normal rate, regular rhythm and normal heart sounds.  Exam reveals no gallop and no friction rub.   No murmur heard. Pulmonary/Chest: Effort normal and breath sounds normal. No respiratory distress. She has no wheezes. She has no rales.  Abdominal: Soft. There is no tenderness.  Neurological: She is alert and oriented to person, place, and time. She has normal strength.  Skin: Skin is warm and dry. She is not diaphoretic.  Psychiatric: She has a normal mood and affect. Her behavior is  normal. Judgment normal.    ED Course  Procedures (including critical care time)  Labs Reviewed  POCT RAPID STREP A ([facility] ONLY)   No results found.   1. TMJ arthralgia       MDM   This pt is really only tender at her TMJ.  Will treat for TMJ pain and have her f/u here if getting worse, otherwise f/u with her dentist.  She has a hiatal hernia and NSAIDs upset her stomach so will use medrol dose pack and valium QHS to hopefully help her to not clench her jaw at night    Meds ordered this encounter  Medications  . methylPREDNISolone (MEDROL DOSEPAK) 4 MG tablet    Sig: Use dose packet as directed    Dispense:  21 tablet    Refill:  0  . diazepam (VALIUM) 5 MG tablet    Sig: Take 1 tablet (5 mg total) by mouth at bedtime as needed for anxiety.    Dispense:  14 tablet    Refill:  0          Graylon Good, PA-C 08/06/12 2040

## 2012-08-07 NOTE — ED Provider Notes (Signed)
Medical screening examination/treatment/procedure(s) were performed by non-physician practitioner and as supervising physician I was immediately available for consultation/collaboration.  Raynald Blend, MD 08/07/12 [phone]

## 2012-11-28 ENCOUNTER — Other Ambulatory Visit: Payer: Self-pay | Admitting: Certified Nurse Midwife

## 2012-11-28 DIAGNOSIS — Z1231 Encounter for screening mammogram for malignant neoplasm of breast: Secondary | ICD-10-CM

## 2012-12-10 ENCOUNTER — Other Ambulatory Visit: Payer: Self-pay | Admitting: Certified Nurse Midwife

## 2012-12-10 ENCOUNTER — Ambulatory Visit (HOSPITAL_COMMUNITY)
Admission: RE | Admit: 2012-12-10 | Discharge: 2012-12-10 | Disposition: A | Discharge status: Home / Self Care | Payer: 59 | Source: Ambulatory Visit | Attending: Certified Nurse Midwife | Admitting: Certified Nurse Midwife

## 2012-12-10 DIAGNOSIS — Z1231 Encounter for screening mammogram for malignant neoplasm of breast: Secondary | ICD-10-CM | POA: Insufficient documentation

## 2012-12-12 ENCOUNTER — Ambulatory Visit: Payer: Self-pay

## 2012-12-12 ENCOUNTER — Other Ambulatory Visit: Payer: Self-pay | Admitting: Occupational Medicine

## 2012-12-12 DIAGNOSIS — R52 Pain, unspecified: Secondary | ICD-10-CM

## 2013-05-14 ENCOUNTER — Encounter (HOSPITAL_COMMUNITY): Payer: Self-pay | Admitting: Emergency Medicine

## 2013-05-14 ENCOUNTER — Emergency Department (HOSPITAL_COMMUNITY)
Admission: EM | Admit: 2013-05-14 | Discharge: 2013-05-14 | Disposition: A | Discharge status: Home / Self Care | Payer: 59 | Source: Home / Self Care | Attending: Family Medicine | Admitting: Family Medicine

## 2013-05-14 ENCOUNTER — Emergency Department (INDEPENDENT_AMBULATORY_CARE_PROVIDER_SITE_OTHER): Payer: 59

## 2013-05-14 DIAGNOSIS — M25569 Pain in unspecified knee: Secondary | ICD-10-CM

## 2013-05-14 MED ORDER — METHYLPREDNISOLONE ACETATE 40 MG/ML IJ SUSP
INTRAMUSCULAR | Status: AC
  Filled 2013-05-14: qty 5

## 2013-05-14 MED ORDER — METHYLPREDNISOLONE ACETATE 40 MG/ML IJ SUSP: 40.0000 mg | Freq: Once | INTRAMUSCULAR | Status: DC

## 2013-05-14 MED ORDER — DICLOFENAC SODIUM 50 MG PO TBEC: 50.0000 mg | DELAYED_RELEASE_TABLET | Freq: Two times a day (BID) | ORAL | Status: DC | PRN

## 2013-05-14 NOTE — ED Provider Notes (Signed)
Kathleen Vang is a 43 y.o. female who presents to Urgent Care today for right knee pain. Patient has pain for present for the last 2 days occurring without injury. Pain is located predominantly in the right posterior lateral corner. Pain is severe and worse with activity. She has difficulty bearing weight because of pain. She denies any radiating pain and swelling. She denies any fevers chills nausea vomiting or diarrhea. She feels well otherwise. She tried ibuprofen which is not very effective.   Past Medical History  Diagnosis Date  . Herpes genitalis in women 2010  . MRSA infection greater than 3 months ago 2009  . Sarcoid 1993  . Anemia    History  Substance Use Topics  . Smoking status: Current Every Day Smoker -- 0.25 packs/day    Types: Cigarettes  . Smokeless tobacco: Not on file  . Alcohol Use: 4.2 oz/week    7 Cans of beer per week   ROS as above Medications: Current Facility-Administered Medications  Medication Dose Route Frequency Provider Last Rate Last Dose  . methylPREDNISolone acetate (DEPO-MEDROL) injection 40 mg  40 mg Intra-articular Once Rodolph Bong, MD       Current Outpatient Prescriptions  Medication Sig Dispense Refill  . diclofenac (VOLTAREN) 50 MG EC tablet Take 1 tablet (50 mg total) by mouth 2 (two) times daily as needed.  60 tablet  0    Exam:  BP 130/69  Pulse 79  Temp(Src) 99.4 F (37.4 C) (Oral)  Resp 16  SpO2 100%  LMP 04/13/2013 Gen: Well NAD Right knee:  Normal-appearing no effusion apparent Range of motion 0-110 no significant retropatellar crepitations Tender palpation lateral joint line and posterior-lateral corner.  Stable ligamentous exam Negative medial McMurray's Positive lateral McMurray's test Strength is intact to hip flexion and extension.   Left knee:  Normal-appearing no effusion Range of motion 0-120 No retropatellar crepitations Stable ligamentous exam Negative McMurray's test  Full strength   Capillary refill  and sensation are intact bilateral lower extremities  Limited musculoskeletal ultrasound of the right knee Quadriceps tendon is normal-appearing. No significant effusion. Patella tendon is normal-appearing Medial meniscus is normal-appearing Lateral meniscus is slightly smaller in the posterior lateral corner but not dramatically abnormal. No significant Baker's cyst noted No pes anserine bursitis  Knee injection. right Consent obtained and timeout performed. Patient seated in a comfortable position with legs hanging off the table.  The medial Peri-patellar tendon space was palpated and marked. The skin was then cleaned with alcohol. Cold spray applied. A 25-gauge inch and a half needle was used to inject 40 mg of Depo-Medrol and 4 mL of marcaine. Patient tolerated procedure well no bleeding. Pain improved following injection .   No results found for this or any previous visit (from the past 24 hour(s)). Dg Knee Complete 4 Views Right  05/14/2013   CLINICAL DATA:  Pain.  EXAM: RIGHT KNEE - COMPLETE 4+ VIEW  COMPARISON:  None.  FINDINGS: There is no evidence of fracture, dislocation, or joint effusion. There is no evidence of arthropathy or other focal bone abnormality. Soft tissues are unremarkable.  IMPRESSION: Negative.   Electronically Signed   By: Maisie Fus  Register   On: 05/14/2013 09:01    Assessment and Plan: 43 y.o. female with right knee pain. Likely posterior lateral meniscus. The meniscus is not fully evaluated with ultrasound. I performed a corticosteroid injection in the hopes to control pain. If not improved would recommend following up with orthopedics. Additionally I have prescribed diclofenac  for pain control. Work note provided.  Discussed warning signs or symptoms. Please see discharge instructions. Patient expresses understanding.    Rodolph BongEvan S Lezlee Gills, MD 05/14/13 [phone]

## 2013-05-14 NOTE — ED Notes (Addendum)
Patient complains of right knee pain that started 2 days ago; states heat makes it worse; states unable to bend and weight bear without pain.

## 2013-05-14 NOTE — Discharge Instructions (Signed)
Thank you for coming in today. [facility] not improving Call or go to the ER if you develop a large red swollen joint with extreme pain or oozing puss.   Joint Injection Care After Refer to this sheet in the next few days. These instructions provide you with information on caring for yourself after you have had a joint injection. Your caregiver also may give you more specific instructions. Your treatment has been planned according to current medical practices, but problems sometimes occur. Call your caregiver if you have any problems or questions after your procedure. After any type of joint injection, it is not uncommon to experience:  Soreness, swelling, or bruising around the injection site.  Mild numbness, tingling, or weakness around the injection site caused by the numbing medicine used before or with the injection. It also is possible to experience the following effects associated with the specific agent after injection:  Iodine-based contrast agents:  Allergic reaction (itching, hives, widespread redness, and swelling beyond the injection site).  Corticosteroids (These effects are rare.):  Allergic reaction.  Increased blood sugar levels (If you have diabetes and you notice that your blood sugar levels have increased, notify your caregiver).  Increased blood pressure levels.  Mood swings.  Hyaluronic acid in the use of viscosupplementation.  Temporary heat or redness.  Temporary rash and itching.  Increased fluid accumulation in the injected joint. These effects all should resolve within a day after your procedure.  HOME CARE INSTRUCTIONS  Limit yourself to light activity the day of your procedure. Avoid lifting heavy objects, bending, stooping, or twisting.  Take prescription or over-the-counter pain medication as directed by your caregiver.  You may apply ice to your injection site to reduce pain and swelling the day of  your procedure. Ice may be applied 03-04 times:  Put ice in a plastic bag.  Place a towel between your skin and the bag.  Leave the ice on for no longer than 15-20 minutes each time. SEEK IMMEDIATE MEDICAL CARE IF:   Pain and swelling get worse rather than better or extend beyond the injection site.  Numbness does not go away.  Blood or fluid continues to leak from the injection site.  You have chest pain.  You have swelling of your face or tongue.  You have trouble breathing or you become dizzy.  You develop a fever, chills, or severe tenderness at the injection site that last longer than 1 day. MAKE SURE YOU:  Understand these instructions.  Watch your condition.  Get help right away if you are not doing well or if you get worse. Document Released: 11/02/2010 Document Revised: 05/14/2011 Document Reviewed: 11/02/2010 [facility] Patient Information 2014 [employer] [place].

## 2013-07-29 ENCOUNTER — Other Ambulatory Visit: Payer: Self-pay | Admitting: Occupational Medicine

## 2013-07-29 ENCOUNTER — Ambulatory Visit: Payer: Self-pay

## 2013-07-29 DIAGNOSIS — M25551 Pain in right hip: Secondary | ICD-10-CM

## 2013-08-10 ENCOUNTER — Other Ambulatory Visit: Payer: Self-pay

## 2013-09-14 ENCOUNTER — Emergency Department (HOSPITAL_COMMUNITY)
Admission: EM | Admit: 2013-09-14 | Discharge: 2013-09-14 | Disposition: A | Discharge status: Home / Self Care | Payer: 59 | Source: Home / Self Care

## 2013-09-14 ENCOUNTER — Encounter (HOSPITAL_COMMUNITY): Payer: Self-pay | Admitting: Emergency Medicine

## 2013-09-14 DIAGNOSIS — J029 Acute pharyngitis, unspecified: Secondary | ICD-10-CM

## 2013-09-14 LAB — POCT RAPID STREP A: Streptococcus, Group A Screen (Direct): POSITIVE — AB

## 2013-09-14 MED ORDER — AZITHROMYCIN 250 MG PO TABS: ORAL_TABLET | ORAL | Status: DC

## 2013-09-14 NOTE — Discharge Instructions (Signed)
Pharyngitis °Pharyngitis is redness, pain, and swelling (inflammation) of your pharynx.  °CAUSES  °Pharyngitis is usually caused by infection. Most of the time, these infections are from viruses (viral) and are part of a cold. However, sometimes pharyngitis is caused by bacteria (bacterial). Pharyngitis can also be caused by allergies. Viral pharyngitis may be spread from person to person by coughing, sneezing, and personal items or utensils (cups, forks, spoons, toothbrushes). Bacterial pharyngitis may be spread from person to person by more intimate contact, such as kissing.  °SIGNS AND SYMPTOMS  °Symptoms of pharyngitis include:   °· Sore throat.   °· Tiredness (fatigue).   °· Low-grade fever.   °· Headache. °· Joint pain and muscle aches. °· Skin rashes. °· Swollen lymph nodes. °· Plaque-like film on throat or tonsils (often seen with bacterial pharyngitis). °DIAGNOSIS  °Your health care provider will ask you questions about your illness and your symptoms. Your medical history, along with a physical exam, is often all that is needed to diagnose pharyngitis. Sometimes, a rapid strep test is done. Other lab tests may also be done, depending on the suspected cause.  °TREATMENT  °Viral pharyngitis will usually get better in 3-4 days without the use of medicine. Bacterial pharyngitis is treated with medicines that kill germs (antibiotics).  °HOME CARE INSTRUCTIONS  °· Drink enough water and fluids to keep your urine clear or pale yellow.   °· Only take over-the-counter or prescription medicines as directed by your health care provider:   °· If you are prescribed antibiotics, make sure you finish them even if you start to feel better.   °· Do not take aspirin.   °· Get lots of rest.   °· Gargle with 8 oz of salt water (½ tsp of salt per 1 qt of water) as often as every 1-2 hours to soothe your throat.   °· Throat lozenges (if you are not at risk for choking) or sprays may be used to soothe your throat. °SEEK MEDICAL  CARE IF:  °· You have large, tender lumps in your neck. °· You have a rash. °· You cough up green, yellow-brown, or bloody spit. °SEEK IMMEDIATE MEDICAL CARE IF:  °· Your neck becomes stiff. °· You drool or are unable to swallow liquids. °· You vomit or are unable to keep medicines or liquids down. °· You have severe pain that does not go away with the use of recommended medicines. °· You have trouble breathing (not caused by a stuffy nose). °MAKE SURE YOU:  °· Understand these instructions. °· Will watch your condition. °· Will get help right away if you are not doing well or get worse. °Document Released: 02/19/2005 Document Revised: 12/10/2012 Document Reviewed: 10/27/2012 °ExitCare® Patient Information ©2015 [employer]. This information is not intended to replace advice given to you by your health care provider. Make sure you discuss any questions you have with your health care provider. ° °Sore Throat °A sore throat is pain, burning, irritation, or scratchiness of the throat. There is often pain or tenderness when swallowing or talking. A sore throat may be accompanied by other symptoms, such as coughing, sneezing, fever, and swollen neck glands. A sore throat is often the first sign of another sickness, such as a cold, flu, strep throat, or mononucleosis (commonly known as mono). Most sore throats go away without medical treatment. °CAUSES  °The most common causes of a sore throat include: °· A viral infection, such as a cold, flu, or mono. °· A bacterial infection, such as strep throat, tonsillitis, or whooping cough. °·   Seasonal allergies.  Dryness in the air.  Irritants, such as smoke or pollution.  Gastroesophageal reflux disease (GERD). HOME CARE INSTRUCTIONS   Only take over-the-counter medicines as directed by your caregiver.  Drink enough fluids to keep your urine clear or pale yellow.  Rest as needed.  Try using throat sprays, lozenges, or sucking on hard candy to ease any pain (if  older than 4 years or as directed).  Sip warm liquids, such as broth, herbal tea, or warm water with honey to relieve pain temporarily. You may also eat or drink cold or frozen liquids such as frozen ice pops.  Gargle with salt water (mix 1 tsp salt with 8 oz of water).  Do not smoke and avoid secondhand smoke.  Put a cool-mist humidifier in your bedroom at night to moisten the air. You can also turn on a hot shower and sit in the bathroom with the door closed for 5-10 minutes. SEEK IMMEDIATE MEDICAL CARE IF:  You have difficulty breathing.  You are unable to swallow fluids, soft foods, or your saliva.  You have increased swelling in the throat.  Your sore throat does not get better in 7 days.  You have nausea and vomiting.  You have a fever or persistent symptoms for more than 2-3 days.  You have a fever and your symptoms suddenly get worse. MAKE SURE YOU:   Understand these instructions.  Will watch your condition.  Will get help right away if you are not doing well or get worse. Document Released: 03/29/2004 Document Revised: 02/06/2012 Document Reviewed: 10/28/2011 [facility] Patient Information 2015 [place], [place]. This information is not intended to replace advice given to you by your health care provider. Make sure you discuss any questions you have with your health care provider.  Strep Throat Strep throat is an infection of the throat. It is caused by a germ. Strep throat spreads from person to person by coughing, sneezing, or close contact. HOME CARE  Rinse your mouth (gargle) with warm salt water (1 teaspoon salt in 1 cup of water). Do this 3 to 4 times per day or as needed for comfort.  Family members with a sore throat or fever should see a doctor.  Make sure everyone in your house washes their hands well.  Do not share food, drinking cups, or personal items.  Eat soft foods until your sore throat gets better.  Drink enough water and fluids to keep your pee  (urine) clear or pale yellow.  Rest.  Stay home from school, daycare, or work until you have taken medicine for 24 hours.  Only take medicine as told by your doctor.  Take your medicine as told. Finish it even if you start to feel better. GET HELP RIGHT AWAY IF:   You have new problems, such as throwing up (vomiting) or bad headaches.  You have a stiff or painful neck, chest pain, trouble breathing, or trouble swallowing.  You have very bad throat pain, drooling, or changes in your voice.  Your neck puffs up (swells) or gets red and tender.  You have a fever.  You are very tired, your mouth is dry, or you are peeing less than normal.  You cannot wake up completely.  You get a rash, cough, or earache.  You have green, yellow-brown, or bloody spit.  Your pain does not get better with medicine. MAKE SURE YOU:   Understand these instructions.  Will watch your condition.  Will get help right away if you  are not doing well or get worse. Document Released: 08/08/2007 Document Revised: 05/14/2011 Document Reviewed: 04/20/2010 [facility] Patient Information 2015 [place], [place]. This information is not intended to replace advice given to you by your health care provider. Make sure you discuss any questions you have with your health care provider.

## 2013-09-14 NOTE — ED Provider Notes (Signed)
CSN: [id number]     Arrival date & time 09/14/13  0802 History   First MD Initiated Contact with Patient 09/14/13 [phone]     Chief Complaint  Patient presents with  . Sore Throat   (Consider location/radiation/quality/duration/timing/severity/associated sxs/prior Treatment) HPI Comments: Sore throat worsening over past 24 hours.   Past Medical History  Diagnosis Date  . Herpes genitalis in women 2010  . MRSA infection greater than 3 months ago 2009  . Sarcoid 1993  . Anemia    Past Surgical History  Procedure Laterality Date  . Tubal ligation    . Lymph node biopsy  1993   Family History  Problem Relation Age of Onset  . Other Mother    History  Substance Use Topics  . Smoking status: Current Every Day Smoker -- 0.25 packs/day    Types: Cigarettes  . Smokeless tobacco: Not on file  . Alcohol Use: 4.2 oz/week    7 Cans of beer per week   OB History   Grav Para Term Preterm Abortions TAB SAB Ect Mult Living   2 2 2       2      Review of Systems  Constitutional: Positive for fever and appetite change.  HENT: Positive for postnasal drip and sore throat.   Respiratory: Negative.   Gastrointestinal: Negative.   Skin: Negative for rash.  Neurological: Negative.     Allergies  Amoxicillin; Codeine; and Penicillins  Home Medications   Prior to Admission medications   Medication Sig Start Date End Date Taking? Authorizing Provider  azithromycin (ZITHROMAX) 250 MG tablet Take 2 tabs daily for 5 days 09/14/13   Hayden Rasmussenavid Renly Guedes, NP  diclofenac (VOLTAREN) 50 MG EC tablet Take 1 tablet (50 mg total) by mouth 2 (two) times daily as needed. 05/14/13   Rodolph BongEvan S Corey, MD   BP 133/86  Pulse 91  Temp(Src) 100 F (37.8 C) (Oral)  Resp 16  SpO2 100%  LMP 08/30/2013 Physical Exam  Nursing note and vitals reviewed. Constitutional: She is oriented to person, place, and time. She appears well-developed and well-nourished. No distress.  HENT:  Bilat TM's nl OP erythematous with few  streaky exudates and small red tonsils.  Eyes: Conjunctivae and EOM are normal.  Neck: Normal range of motion. Neck supple.  bialt anterior cervical nodes  Cardiovascular: Normal rate and regular rhythm.   Pulmonary/Chest: Effort normal and breath sounds normal. No respiratory distress.  Musculoskeletal: Normal range of motion. She exhibits no edema.  Lymphadenopathy:    She has cervical adenopathy.  Neurological: She is alert and oriented to person, place, and time.  Skin: Skin is warm and dry. No rash noted.  Psychiatric: She has a normal mood and affect.    ED Course  Procedures (including critical care time) Labs Review Labs Reviewed - No data to display  Imaging Review No results found.   MDM   1. Exudative pharyngitis     azithro 500 q d x 5 d OTC pain relievers/motrin fluids    Hayden Rasmussenavid Tam Delisle, NP 09/14/13 [phone]

## 2013-09-14 NOTE — ED Notes (Signed)
C/o sore throat  And pain with swallowing.  On set last night.  Unsure of fever, did not check.

## 2013-09-15 NOTE — ED Provider Notes (Signed)
Medical screening examination/treatment/procedure(s) were performed by non-physician practitioner and as supervising physician I was immediately available for consultation/collaboration.  Jaydin Jalomo, M.D.  Oletta Buehring C Vail Vuncannon, MD 09/15/13 1421 

## 2013-09-17 ENCOUNTER — Emergency Department (INDEPENDENT_AMBULATORY_CARE_PROVIDER_SITE_OTHER)
Admission: EM | Admit: 2013-09-17 | Discharge: 2013-09-17 | Disposition: A | Discharge status: Home / Self Care | Payer: 59 | Source: Home / Self Care | Attending: Emergency Medicine | Admitting: Emergency Medicine

## 2013-09-17 ENCOUNTER — Encounter (HOSPITAL_COMMUNITY): Payer: Self-pay | Admitting: Emergency Medicine

## 2013-09-17 ENCOUNTER — Emergency Department (HOSPITAL_COMMUNITY)
Admission: EM | Admit: 2013-09-17 | Discharge: 2013-09-17 | Disposition: A | Discharge status: Home / Self Care | Payer: Self-pay | Source: Home / Self Care

## 2013-09-17 DIAGNOSIS — J020 Streptococcal pharyngitis: Secondary | ICD-10-CM

## 2013-09-17 LAB — POCT INFECTIOUS MONO SCREEN: Mono Screen: NEGATIVE

## 2013-09-17 MED ORDER — METHYLPREDNISOLONE ACETATE 80 MG/ML IJ SUSP
INTRAMUSCULAR | Status: AC
  Filled 2013-09-17: qty 1

## 2013-09-17 MED ORDER — CLINDAMYCIN HCL 300 MG PO CAPS: 300.0000 mg | ORAL_CAPSULE | Freq: Four times a day (QID) | ORAL | Status: DC

## 2013-09-17 MED ORDER — PREDNISONE 20 MG PO TABS: 20.0000 mg | ORAL_TABLET | Freq: Two times a day (BID) | ORAL | Status: DC

## 2013-09-17 MED ORDER — DEXAMETHASONE SODIUM PHOSPHATE 10 MG/ML IJ SOLN: 10.0000 mg | Freq: Once | INTRAMUSCULAR | Status: DC

## 2013-09-17 MED ORDER — METHYLPREDNISOLONE ACETATE 80 MG/ML IJ SUSP
80.0000 mg | Freq: Once | INTRAMUSCULAR | Status: AC
Start: 2013-09-17 — End: 2013-09-17
  Administered 2013-09-17: 80 mg via INTRAMUSCULAR

## 2013-09-17 MED ORDER — TRAMADOL HCL 50 MG PO TABS: 100.0000 mg | ORAL_TABLET | Freq: Three times a day (TID) | ORAL | Status: DC | PRN

## 2013-09-17 NOTE — ED Notes (Signed)
Pyxis out of decadron, dr Lorenz Coasterkeller notified.  Called pharmacy and maggie-registration going to pharmacy to pick up patient dose

## 2013-09-17 NOTE — ED Notes (Signed)
Seen 7/13.  Patient reports antibiotic not helping.  Reports swelling and pain

## 2013-09-17 NOTE — ED Provider Notes (Signed)
Chief Complaint   Chief Complaint  Patient presents with  . Sore Throat    History of Present Illness   Kathleen Vang is a 43 year old female who has had a five-day history of severe sore throat, worse on the left side. It radiates towards her ear and hurts to swallow and she was here 4 days ago, when this first began. She had a rapid strep screen which was positive. Since she's allergic to penicillin she was placed on azithromycin 500 mg a day for 5 days. She feels no better since she was last seen. She denies feeling worse. She's had subjective fevers and slight headache. She denies any nasal congestion, rhinorrhea, stiff neck, cough, or GI symptoms. She has been exposed to strep.   Review of Systems   Other than as noted above, the patient denies any of the following symptoms. Systemic:  No fever, chills, sweats, myalgias, or headache. Eye:  No redness, pain or drainage. ENT:  No earache, nasal congestion, sneezing, rhinorrhea, sinus pressure, sinus pain, or post nasal drip. Lungs:  No cough, sputum production, wheezing, shortness of breath, or chest pain. GI:  No abdominal pain, nausea, vomiting, or diarrhea. Skin:  No rash.  PMFSH   Past medical history, family history, social history, meds, and allergies were reviewed.   Physical Exam     Vital signs:  BP 118/84  Pulse 70  Temp(Src) 98.3 F (36.8 C) (Oral)  Resp 16  SpO2 100%  LMP 08/30/2013 General:  Alert, in no distress. Phonation was normal, no drooling, and patient was able to handle secretions well.  Eye:  No conjunctival injection or drainage. Lids were normal. ENT:  TMs and canals were normal, without erythema or inflammation.  Nasal mucosa was clear and uncongested, without drainage.  Mucous membranes were moist.  Exam of pharynx both tonsils are enlarged and red, left more so than right, there is no exudate, no bulging of the tonsillar pillars, and the uvula is midline.  There were no oral ulcerations or lesions.  There was no bulging of the tonsillar pillars, and the uvula was midline. Neck:  Supple, no adenopathy, tenderness or mass. Lungs:  No respiratory distress.  Lungs were clear to auscultation, without wheezes, rales or rhonchi.  Breath sounds were clear and equal bilaterally.  Heart:  Regular rhythm, without gallops, murmers or rubs. Skin:  Clear, warm, and dry, without rash or lesions.  Labs   Results for orders placed during the hospital encounter of 09/17/13  POCT INFECTIOUS MONO SCREEN      Result Value Ref Range   Mono Screen NEGATIVE  NEGATIVE    Course in Urgent Care Center   The patient was given the following meds: Medications  methylPREDNISolone acetate (DEPO-MEDROL) injection 80 mg (80 mg Intramuscular Given 09/17/13 1036)   Assessment   The encounter diagnosis was Strep throat.  There is no evidence of a peritonsillar abscess, retropharyngeal abscess, or epiglottitis.    Plan     1.  Meds:  The following meds were prescribed:   New Prescriptions   CLINDAMYCIN (CLEOCIN) 300 MG CAPSULE    Take 1 capsule (300 mg total) by mouth 4 (four) times daily.   PREDNISONE (DELTASONE) 20 MG TABLET    Take 1 tablet (20 mg total) by mouth 2 (two) times daily.   TRAMADOL (ULTRAM) 50 MG TABLET    Take 2 tablets (100 mg total) by mouth every 8 (eight) hours as needed.    2.  Patient Education/Counseling:  The patient was given appropriate handouts, self care instructions, and instructed in symptomatic relief, including hot saline gargles, throat lozenges, infectious precautions, and need to trade out toothbrush.    3.  Follow up:  The patient was told to follow up here if no better in 2 or 3 days, or sooner if becoming worse in any way, and given some red flag symptoms such as difficulty swallowing or breathing which would prompt immediate return.       Reuben Likes, MD 09/17/13 1048

## 2013-09-17 NOTE — Discharge Instructions (Signed)
Strep Throat Strep throat is an infection of the throat caused by a bacteria named Streptococcus pyogenes. Your caregiver may call the infection streptococcal "tonsillitis" or "pharyngitis" depending on whether there are signs of inflammation in the tonsils or back of the throat. Strep throat is most common in children aged 45-15 years during the cold months of the year, but it can occur in people of any age during any season. This infection is spread from person to person (contagious) through coughing, sneezing, or other close contact. SYMPTOMS   Fever or chills.  Painful, swollen, red tonsils or throat.  Pain or difficulty when swallowing.  White or yellow spots on the tonsils or throat.  Swollen, tender lymph nodes or "glands" of the neck or under the jaw.  Red rash all over the body (rare). DIAGNOSIS  Many different infections can cause the same symptoms. A test must be done to confirm the diagnosis so the right treatment can be given. A "rapid strep test" can help your caregiver make the diagnosis in a few minutes. If this test is not available, a light swab of the infected area can be used for a throat culture test. If a throat culture test is done, results are usually available in a day or two. TREATMENT  Strep throat is treated with antibiotic medicine. HOME CARE INSTRUCTIONS   Gargle with 1 tsp of salt in 1 cup of warm water, 3-4 times per day or as needed for comfort.  Family members who also have a sore throat or fever should be tested for strep throat and treated with antibiotics if they have the strep infection.  Make sure everyone in your household washes their hands well.  Do not share food, drinking cups, or personal items that could cause the infection to spread to others.  You may need to eat a soft food diet until your sore throat gets better.  Drink enough water and fluids to keep your urine clear or pale yellow. This will help prevent dehydration.  Get plenty of  rest.  Stay home from school, daycare, or work until you have been on antibiotics for 24 hours.  Only take over-the-counter or prescription medicines for pain, discomfort, or fever as directed by your caregiver.  If antibiotics are prescribed, take them as directed. Finish them even if you start to feel better. SEEK MEDICAL CARE IF:   The glands in your neck continue to enlarge.  You develop a rash, cough, or earache.  You cough up green, yellow-brown, or bloody sputum.  You have pain or discomfort not controlled by medicines.  Your problems seem to be getting worse rather than better. SEEK IMMEDIATE MEDICAL CARE IF:   You develop any new symptoms such as vomiting, severe headache, stiff or painful neck, chest pain, shortness of breath, or trouble swallowing.  You develop severe throat pain, drooling, or changes in your voice.  You develop swelling of the neck, or the skin on the neck becomes red and tender.  You have a fever.  You develop signs of dehydration, such as fatigue, dry mouth, and decreased urination.  You become increasingly sleepy, or you cannot wake up completely. Document Released: 02/17/2000 Document Revised: 02/06/2012 Document Reviewed: 04/20/2010 [employer] Patient Information 2015 [employer]. This information is not intended to replace advice given to you by your health care provider. Make sure you discuss any questions you have with your health care provider.  

## 2013-09-17 NOTE — ED Notes (Signed)
Patient notified that injection coming from pharmacy

## 2013-09-17 NOTE — ED Notes (Signed)
Provided blankets 

## 2013-10-09 NOTE — ED Notes (Signed)
Patient called to report has developed a yeast infection , relates to having been on recent course of antibiotics. Discussed w Dr Lorenz CoasterKeller, who authorized pt to have diflucan 150 mg PO, 1 now and 1 in 3- 7 days if no improvement.

## 2013-10-09 NOTE — ED Notes (Signed)
[facility] [address] at patient request. spoke directly w techanician

## 2013-11-26 ENCOUNTER — Other Ambulatory Visit: Payer: Self-pay | Admitting: Obstetrics

## 2013-11-27 LAB — CYTOLOGY - PAP

## 2014-01-04 ENCOUNTER — Encounter (HOSPITAL_COMMUNITY): Payer: Self-pay | Admitting: Emergency Medicine

## 2014-02-10 ENCOUNTER — Ambulatory Visit: Payer: Self-pay

## 2014-02-10 ENCOUNTER — Other Ambulatory Visit: Payer: Self-pay | Admitting: Occupational Medicine

## 2014-02-10 DIAGNOSIS — R52 Pain, unspecified: Secondary | ICD-10-CM

## 2014-03-22 ENCOUNTER — Encounter (HOSPITAL_COMMUNITY): Payer: Self-pay

## 2014-03-22 ENCOUNTER — Emergency Department (HOSPITAL_COMMUNITY)
Admission: EM | Admit: 2014-03-22 | Discharge: 2014-03-22 | Disposition: A | Discharge status: Home / Self Care | Payer: 59 | Source: Home / Self Care | Attending: Emergency Medicine | Admitting: Emergency Medicine

## 2014-03-22 DIAGNOSIS — J0140 Acute pansinusitis, unspecified: Secondary | ICD-10-CM

## 2014-03-22 MED ORDER — AZITHROMYCIN 250 MG PO TABS: ORAL_TABLET | ORAL | Status: DC

## 2014-03-22 MED ORDER — FLUCONAZOLE 150 MG PO TABS: 150.0000 mg | ORAL_TABLET | Freq: Once | ORAL | Status: DC

## 2014-03-22 NOTE — ED Notes (Signed)
Reports 3 day duration of facial pan and pressure not relieved w OTC medications. NAD

## 2014-03-22 NOTE — Discharge Instructions (Signed)
You have a sinus infection. Take the Z-pac as prescribed. Continue the Allegra-D. Use nasal saline spray as often as you can. I also sent in a prescription for Diflucan - fill if you develop symptoms of a yeast infection after the antibiotics.  Follow up as needed.

## 2014-03-22 NOTE — ED Provider Notes (Signed)
CSN: [id number]     Arrival date & time 03/22/14  0803 History   First MD Initiated Contact with Patient 03/22/14 0813     Chief Complaint  Patient presents with  . Facial Pain   (Consider location/radiation/quality/duration/timing/severity/associated sxs/prior Treatment) HPI He is a 44 year old woman here for evaluation of sinus pain. She states 2 days ago she suddenly developed sinus pressure. This is associated with thick nasal discharge. She denies any preceding URI symptoms. No sore throat. She does have a mild cough that started today. No ear pain. No fevers or chills. No nausea or vomiting.  Past Medical History  Diagnosis Date  . Herpes genitalis in women 2010  . MRSA infection greater than 3 months ago 2009  . Sarcoid 1993  . Anemia    Past Surgical History  Procedure Laterality Date  . Tubal ligation    . Lymph node biopsy  1993   Family History  Problem Relation Age of Onset  . Other Mother    History  Substance Use Topics  . Smoking status: Current Every Day Smoker -- 0.25 packs/day    Types: Cigarettes  . Smokeless tobacco: Not on file  . Alcohol Use: 4.2 oz/week    7 Cans of beer per week   OB History    Gravida Para Term Preterm AB TAB SAB Ectopic Multiple Living   2 2 2       2      Review of Systems  Constitutional: Negative for fever, chills, activity change and appetite change.  HENT: Positive for congestion, rhinorrhea and sinus pressure. Negative for ear pain and sore throat.   Respiratory: Positive for cough. Negative for shortness of breath.   Gastrointestinal: Negative for nausea and vomiting.  Musculoskeletal: Negative for myalgias.  Neurological: Negative for headaches.    Allergies  Amoxicillin; Codeine; and Penicillins  Home Medications   Prior to Admission medications   Medication Sig Start Date End Date Taking? Authorizing Provider  azithromycin (ZITHROMAX Z-PAK) 250 MG tablet Take 2 pills today, then 1 pill daily until gone 03/22/14    Charm RingsErin J Eriel Doyon, MD  fluconazole (DIFLUCAN) 150 MG tablet Take 1 tablet (150 mg total) by mouth once. 03/22/14   Charm RingsErin J Natoshia Souter, MD  ibuprofen (ADVIL,MOTRIN) 200 MG tablet Take 200 mg by mouth every 6 (six) hours as needed.    Historical Provider, MD   BP 136/98 mmHg  Pulse 89  Temp(Src) 98.8 F (37.1 C) (Oral)  Resp 16  SpO2 100%  LMP 03/18/2014 (Exact Date) Physical Exam  Constitutional: She is oriented to person, place, and time. She appears well-developed and well-nourished. No distress.  HENT:  Head: Normocephalic and atraumatic.  Right Ear: Tympanic membrane normal.  Left Ear: Tympanic membrane normal.  Nose: Mucosal edema and rhinorrhea present. Right sinus exhibits maxillary sinus tenderness and frontal sinus tenderness. Left sinus exhibits maxillary sinus tenderness and frontal sinus tenderness.  Mouth/Throat: Oropharynx is clear and moist. No oropharyngeal exudate.  Neck: Neck supple.  Cardiovascular: Normal rate, regular rhythm and normal heart sounds.   No murmur heard. Pulmonary/Chest: Effort normal and breath sounds normal. No respiratory distress. She has no wheezes. She has no rales.  Lymphadenopathy:    She has no cervical adenopathy.  Neurological: She is alert and oriented to person, place, and time.    ED Course  Procedures (including critical care time) Labs Review Labs Reviewed - No data to display  Imaging Review No results found.   MDM   1. Acute  pansinusitis, recurrence not specified    We'll treat with a Z-Pak. Continue Allegra-D at home. Recommended use of nasal saline spray as often as tolerated. Follow-up as needed.    Charm Rings, MD 03/22/14 [phone]

## 2014-05-24 ENCOUNTER — Telehealth: Payer: Self-pay | Admitting: *Deleted

## 2014-05-24 NOTE — Telephone Encounter (Signed)
Error

## 2014-07-05 ENCOUNTER — Ambulatory Visit: Payer: Self-pay

## 2014-07-05 ENCOUNTER — Other Ambulatory Visit: Payer: Self-pay | Admitting: Occupational Medicine

## 2014-07-05 DIAGNOSIS — M546 Pain in thoracic spine: Secondary | ICD-10-CM

## 2015-04-04 MED FILL — ADAPALENE 0.1% GEL: 0.1 | 90 days supply | Qty: 45 | Fill #2

## 2015-04-22 DIAGNOSIS — L308 Other specified dermatitis: Secondary | ICD-10-CM | POA: Diagnosis not present

## 2015-04-27 DIAGNOSIS — Z6827 Body mass index (BMI) 27.0-27.9, adult: Secondary | ICD-10-CM | POA: Diagnosis not present

## 2015-04-27 DIAGNOSIS — Z01419 Encounter for gynecological examination (general) (routine) without abnormal findings: Secondary | ICD-10-CM | POA: Diagnosis not present

## 2015-04-29 MED FILL — DESONIDE 0.05% CREAM: 0.05 | 10 days supply | Qty: 45 | Fill #0

## 2015-04-29 MED FILL — LEVOCETIRIZINE 5 MG TABLET: 5 | 90 days supply | Qty: 90 | Fill #0

## 2015-05-10 DIAGNOSIS — Z1231 Encounter for screening mammogram for malignant neoplasm of breast: Secondary | ICD-10-CM | POA: Diagnosis not present

## 2015-05-10 DIAGNOSIS — L989 Disorder of the skin and subcutaneous tissue, unspecified: Secondary | ICD-10-CM | POA: Diagnosis not present

## 2015-07-27 MED FILL — ADAPALENE 0.1% GEL: 0.1 | 90 days supply | Qty: 45 | Fill #0

## 2015-08-03 MED FILL — LEVOCETIRIZINE 5 MG TABLET: 5 | 90 days supply | Qty: 90 | Fill #1

## 2015-10-31 MED FILL — ADAPALENE 0.1% GEL: 0.1 | 90 days supply | Qty: 45 | Fill #1

## 2015-12-27 DIAGNOSIS — H5213 Myopia, bilateral: Secondary | ICD-10-CM | POA: Diagnosis not present

## 2016-01-19 DIAGNOSIS — F4320 Adjustment disorder, unspecified: Secondary | ICD-10-CM | POA: Diagnosis not present

## 2016-01-31 MED FILL — ADAPALENE 0.1% GEL: 0.1 | 90 days supply | Qty: 45 | Fill #2

## 2016-02-24 DIAGNOSIS — L700 Acne vulgaris: Secondary | ICD-10-CM | POA: Diagnosis not present

## 2016-02-24 DIAGNOSIS — L308 Other specified dermatitis: Secondary | ICD-10-CM | POA: Diagnosis not present

## 2016-02-24 MED FILL — FLUOCINONIDE 0.1% CREAM: 0.1 | 65 days supply | Qty: 120 | Fill #0

## 2016-02-24 MED FILL — DESONIDE 0.05% CREAM: 0.05 | 30 days supply | Qty: 60 | Fill #0

## 2016-02-24 MED FILL — ADAPALENE 0.1% GEL: 0.1 | 30 days supply | Qty: 45 | Fill #0

## 2016-03-07 DIAGNOSIS — F419 Anxiety disorder, unspecified: Secondary | ICD-10-CM | POA: Diagnosis not present

## 2016-03-07 DIAGNOSIS — R51 Headache: Secondary | ICD-10-CM | POA: Diagnosis not present

## 2016-03-07 DIAGNOSIS — Z6828 Body mass index (BMI) 28.0-28.9, adult: Secondary | ICD-10-CM | POA: Diagnosis not present

## 2016-03-07 DIAGNOSIS — Z634 Disappearance and death of family member: Secondary | ICD-10-CM | POA: Diagnosis not present

## 2016-03-07 MED FILL — SERTRALINE HCL 50 MG TABLET: 50 | 30 days supply | Qty: 30 | Fill #0

## 2016-03-09 MED FILL — ALPRAZolam 0.5 MG TABS: 0.5 | 15 days supply | Qty: 30 | Fill #0

## 2016-03-13 DIAGNOSIS — J310 Chronic rhinitis: Secondary | ICD-10-CM | POA: Diagnosis not present

## 2016-03-13 DIAGNOSIS — J3501 Chronic tonsillitis: Secondary | ICD-10-CM | POA: Diagnosis not present

## 2016-04-04 ENCOUNTER — Ambulatory Visit (INDEPENDENT_AMBULATORY_CARE_PROVIDER_SITE_OTHER): Payer: 59

## 2016-04-04 ENCOUNTER — Ambulatory Visit: Payer: 59

## 2016-04-04 ENCOUNTER — Ambulatory Visit (INDEPENDENT_AMBULATORY_CARE_PROVIDER_SITE_OTHER): Payer: 59 | Admitting: Podiatry

## 2016-04-04 ENCOUNTER — Encounter: Payer: Self-pay | Admitting: Podiatry

## 2016-04-04 VITALS — HR 83 | Resp 16 | Ht 62.0 in | Wt 150.0 lb

## 2016-04-04 DIAGNOSIS — M79671 Pain in right foot: Secondary | ICD-10-CM

## 2016-04-04 DIAGNOSIS — M779 Enthesopathy, unspecified: Secondary | ICD-10-CM | POA: Diagnosis not present

## 2016-04-04 DIAGNOSIS — M76821 Posterior tibial tendinitis, right leg: Secondary | ICD-10-CM

## 2016-04-04 DIAGNOSIS — M7750 Other enthesopathy of unspecified foot: Secondary | ICD-10-CM | POA: Diagnosis not present

## 2016-04-04 DIAGNOSIS — M2141 Flat foot [pes planus] (acquired), right foot: Secondary | ICD-10-CM

## 2016-04-04 DIAGNOSIS — M79672 Pain in left foot: Secondary | ICD-10-CM

## 2016-04-04 DIAGNOSIS — M2142 Flat foot [pes planus] (acquired), left foot: Secondary | ICD-10-CM

## 2016-04-04 DIAGNOSIS — M76822 Posterior tibial tendinitis, left leg: Secondary | ICD-10-CM

## 2016-04-04 MED ORDER — DICLOFENAC SODIUM 75 MG PO TBEC: 75.0000 mg | DELAYED_RELEASE_TABLET | Freq: Two times a day (BID) | ORAL | 2 refills | Status: DC

## 2016-04-04 MED ORDER — TRIAMCINOLONE ACETONIDE 10 MG/ML IJ SUSP
10.0000 mg | Freq: Once | INTRAMUSCULAR | Status: AC
  Administered 2016-04-04: 10 mg

## 2016-04-04 MED FILL — DICLOFENAC SOD 75 MG TAB EC: 75 | 25 days supply | Qty: 50 | Fill #0

## 2016-04-04 NOTE — Progress Notes (Signed)
Subjective:     Patient ID: Kathleen Vang, female   DOB: [birth year], 46 y.o.   MRN: [id number]  HPI patient presents stating she's had a lot of pain inside the ankle right and left and that it's been getting worse over the last 3 months. States the left is worse than the right   Review of Systems  All other systems reviewed and are negative.      Objective:   Physical Exam  Constitutional: She is oriented to person, place, and time.  Cardiovascular: Intact distal pulses.   Musculoskeletal: Normal range of motion.  Neurological: She is oriented to person, place, and time.  Skin: Skin is warm.  Nursing note and vitals reviewed.  neurovascular status intact muscle strength was adequate range of motion within normal limits with patient found have inflammation and pain around the posterior tibial insertion left over right with moderate depression of the arch noted and excessive E version. Patient does have some prominence of the area and is noted to have good digital perfusion and is well oriented 3     Assessment:     Tendinitis of the posterior tibial tendon at its insertion into the navicular left over right with depression of the arch bilateral secondary to overall foot structure    Plan:     H&P conditions reviewed and at this point for the left I did a careful sheath injection. I then dispensed fascial brace bilateral discussed long-term orthotics and if symptoms persist we will need to consider MRI with possibility for reconstruction of the arch and possible posterior tibial tightening   X-ray indicated there is depression of the arch bilateral with prominent navicular

## 2016-04-04 NOTE — Progress Notes (Signed)
   Subjective:    Patient ID: Kathleen Vang, female    DOB: 1970-10-25, [age] y.o.   MRN: [id number]  HPI  Chief Complaint  Patient presents with  . Foot Pain    Left; Medial Side x 3 months. Pt states that "the area is swells and it is work after she works a 12 hour shift". Right; Medial Side x "Pain comes and goes"; Left foot is worse than right.       Review of Systems     Objective:   Physical Exam        Assessment & Plan:

## 2016-04-04 NOTE — Patient Instructions (Signed)

## 2016-04-10 DIAGNOSIS — Z6828 Body mass index (BMI) 28.0-28.9, adult: Secondary | ICD-10-CM | POA: Diagnosis not present

## 2016-04-10 DIAGNOSIS — F419 Anxiety disorder, unspecified: Secondary | ICD-10-CM | POA: Diagnosis not present

## 2016-04-10 DIAGNOSIS — G4700 Insomnia, unspecified: Secondary | ICD-10-CM | POA: Diagnosis not present

## 2016-04-10 DIAGNOSIS — Z634 Disappearance and death of family member: Secondary | ICD-10-CM | POA: Diagnosis not present

## 2016-04-23 ENCOUNTER — Ambulatory Visit: Payer: 59 | Admitting: Podiatry

## 2016-04-25 ENCOUNTER — Ambulatory Visit: Payer: 59 | Admitting: Podiatry

## 2016-05-10 ENCOUNTER — Other Ambulatory Visit: Payer: Self-pay | Admitting: Obstetrics

## 2016-05-10 DIAGNOSIS — Z124 Encounter for screening for malignant neoplasm of cervix: Secondary | ICD-10-CM | POA: Diagnosis not present

## 2016-05-10 DIAGNOSIS — Z01419 Encounter for gynecological examination (general) (routine) without abnormal findings: Secondary | ICD-10-CM | POA: Diagnosis not present

## 2016-05-10 DIAGNOSIS — Z6827 Body mass index (BMI) 27.0-27.9, adult: Secondary | ICD-10-CM | POA: Diagnosis not present

## 2016-05-10 DIAGNOSIS — L989 Disorder of the skin and subcutaneous tissue, unspecified: Secondary | ICD-10-CM | POA: Diagnosis not present

## 2016-05-10 DIAGNOSIS — Z1231 Encounter for screening mammogram for malignant neoplasm of breast: Secondary | ICD-10-CM | POA: Diagnosis not present

## 2016-05-16 ENCOUNTER — Other Ambulatory Visit: Payer: Self-pay | Admitting: Obstetrics

## 2016-05-16 DIAGNOSIS — Z124 Encounter for screening for malignant neoplasm of cervix: Secondary | ICD-10-CM | POA: Diagnosis not present

## 2016-05-16 DIAGNOSIS — L989 Disorder of the skin and subcutaneous tissue, unspecified: Secondary | ICD-10-CM | POA: Diagnosis not present

## 2016-05-16 DIAGNOSIS — L309 Dermatitis, unspecified: Secondary | ICD-10-CM | POA: Diagnosis not present

## 2016-05-16 LAB — CYTOLOGY - PAP

## 2016-05-18 MED FILL — VALACYCLOVIR HCL 500 MG TAB: 500 | 15 days supply | Qty: 30 | Fill #0

## 2016-05-29 DIAGNOSIS — G4700 Insomnia, unspecified: Secondary | ICD-10-CM | POA: Diagnosis not present

## 2016-05-29 DIAGNOSIS — M7740 Metatarsalgia, unspecified foot: Secondary | ICD-10-CM | POA: Diagnosis not present

## 2016-05-29 DIAGNOSIS — M2140 Flat foot [pes planus] (acquired), unspecified foot: Secondary | ICD-10-CM | POA: Diagnosis not present

## 2016-05-29 DIAGNOSIS — M25569 Pain in unspecified knee: Secondary | ICD-10-CM | POA: Diagnosis not present

## 2016-06-14 DIAGNOSIS — H5213 Myopia, bilateral: Secondary | ICD-10-CM | POA: Diagnosis not present

## 2016-06-14 DIAGNOSIS — H524 Presbyopia: Secondary | ICD-10-CM | POA: Diagnosis not present

## 2016-06-14 DIAGNOSIS — H40013 Open angle with borderline findings, low risk, bilateral: Secondary | ICD-10-CM | POA: Diagnosis not present

## 2016-07-10 DIAGNOSIS — H47233 Glaucomatous optic atrophy, bilateral: Secondary | ICD-10-CM | POA: Diagnosis not present

## 2016-07-10 DIAGNOSIS — H25813 Combined forms of age-related cataract, bilateral: Secondary | ICD-10-CM | POA: Diagnosis not present

## 2016-07-16 DIAGNOSIS — A609 Anogenital herpesviral infection, unspecified: Secondary | ICD-10-CM | POA: Diagnosis not present

## 2016-07-16 DIAGNOSIS — A6000 Herpesviral infection of urogenital system, unspecified: Secondary | ICD-10-CM | POA: Diagnosis not present

## 2016-07-16 MED FILL — VALACYCLOVIR HCL 500 MG TAB: 500 | 15 days supply | Qty: 30 | Fill #1

## 2016-07-25 DIAGNOSIS — M2550 Pain in unspecified joint: Secondary | ICD-10-CM | POA: Diagnosis not present

## 2016-07-25 DIAGNOSIS — M25569 Pain in unspecified knee: Secondary | ICD-10-CM | POA: Diagnosis not present

## 2016-07-27 DIAGNOSIS — Z6828 Body mass index (BMI) 28.0-28.9, adult: Secondary | ICD-10-CM | POA: Diagnosis not present

## 2016-07-27 DIAGNOSIS — G4700 Insomnia, unspecified: Secondary | ICD-10-CM | POA: Diagnosis not present

## 2016-07-27 DIAGNOSIS — M2140 Flat foot [pes planus] (acquired), unspecified foot: Secondary | ICD-10-CM | POA: Diagnosis not present

## 2016-07-27 DIAGNOSIS — M25569 Pain in unspecified knee: Secondary | ICD-10-CM | POA: Diagnosis not present

## 2016-08-20 MED FILL — DESONIDE 0.05 % CRM: 0.05 | 30 days supply | Qty: 60 | Fill #1

## 2016-09-10 MED FILL — CYCLOBENZAPRINE 5 MG TABLET: 5 | 5 days supply | Qty: 30 | Fill #0

## 2016-10-11 DIAGNOSIS — M791 Myalgia: Secondary | ICD-10-CM | POA: Diagnosis not present

## 2016-11-08 DIAGNOSIS — J019 Acute sinusitis, unspecified: Secondary | ICD-10-CM | POA: Diagnosis not present

## 2016-11-08 MED FILL — AZITHROMYCIN 250 MG TABLET: 250 | 5 days supply | Qty: 6 | Fill #0

## 2016-11-12 MED FILL — FLUCONAZOLE 150 MG TABLET: 150 | 1 days supply | Qty: 1 | Fill #0

## 2016-11-13 MED FILL — DESONIDE 0.05% CREAM: 0.05 | 30 days supply | Qty: 60 | Fill #2

## 2016-11-25 DIAGNOSIS — K5900 Constipation, unspecified: Secondary | ICD-10-CM | POA: Diagnosis not present

## 2017-01-31 DIAGNOSIS — J039 Acute tonsillitis, unspecified: Secondary | ICD-10-CM | POA: Diagnosis not present

## 2017-03-12 DIAGNOSIS — S29011A Strain of muscle and tendon of front wall of thorax, initial encounter: Secondary | ICD-10-CM | POA: Diagnosis not present

## 2017-03-12 DIAGNOSIS — Z6828 Body mass index (BMI) 28.0-28.9, adult: Secondary | ICD-10-CM | POA: Diagnosis not present

## 2017-03-12 DIAGNOSIS — N644 Mastodynia: Secondary | ICD-10-CM | POA: Diagnosis not present

## 2017-03-12 DIAGNOSIS — R0789 Other chest pain: Secondary | ICD-10-CM | POA: Diagnosis not present

## 2017-03-12 MED FILL — MELOXICAM 7.5 MG TABLET: 7.5 | 30 days supply | Qty: 30 | Fill #0

## 2017-03-25 DIAGNOSIS — L700 Acne vulgaris: Secondary | ICD-10-CM | POA: Diagnosis not present

## 2017-03-25 DIAGNOSIS — L309 Dermatitis, unspecified: Secondary | ICD-10-CM | POA: Diagnosis not present

## 2017-03-25 DIAGNOSIS — Z23 Encounter for immunization: Secondary | ICD-10-CM | POA: Diagnosis not present

## 2017-03-25 DIAGNOSIS — L816 Other disorders of diminished melanin formation: Secondary | ICD-10-CM | POA: Diagnosis not present

## 2017-03-25 DIAGNOSIS — L905 Scar conditions and fibrosis of skin: Secondary | ICD-10-CM | POA: Diagnosis not present

## 2017-03-25 MED FILL — DESONIDE 0.05% CREAM: 0.05 | 15 days supply | Qty: 30 | Fill #0

## 2017-03-29 DIAGNOSIS — N644 Mastodynia: Secondary | ICD-10-CM | POA: Diagnosis not present

## 2017-03-29 DIAGNOSIS — R0789 Other chest pain: Secondary | ICD-10-CM | POA: Diagnosis not present

## 2017-03-29 MED FILL — hydrOXYzine HCL 25 MG TABS: 25 | 30 days supply | Qty: 30 | Fill #0

## 2017-04-02 DIAGNOSIS — N760 Acute vaginitis: Secondary | ICD-10-CM | POA: Diagnosis not present

## 2017-04-02 DIAGNOSIS — A609 Anogenital herpesviral infection, unspecified: Secondary | ICD-10-CM | POA: Diagnosis not present

## 2017-04-02 MED FILL — VALACYCLOVIR HCL 500 MG TAB: 500 | 25 days supply | Qty: 30 | Fill #0

## 2017-04-02 MED FILL — metroNIDAZOLE 500 MG TABS: 500 | 7 days supply | Qty: 14 | Fill #0

## 2017-04-12 MED FILL — MELOXICAM 7.5 MG TABLET: 7.5 | 30 days supply | Qty: 30 | Fill #0

## 2017-04-26 DIAGNOSIS — J3501 Chronic tonsillitis: Secondary | ICD-10-CM | POA: Diagnosis not present

## 2017-05-28 DIAGNOSIS — Z0000 Encounter for general adult medical examination without abnormal findings: Secondary | ICD-10-CM | POA: Diagnosis not present

## 2017-05-29 DIAGNOSIS — Z124 Encounter for screening for malignant neoplasm of cervix: Secondary | ICD-10-CM | POA: Diagnosis not present

## 2017-05-29 DIAGNOSIS — Z01419 Encounter for gynecological examination (general) (routine) without abnormal findings: Secondary | ICD-10-CM | POA: Diagnosis not present

## 2017-05-29 DIAGNOSIS — Z1231 Encounter for screening mammogram for malignant neoplasm of breast: Secondary | ICD-10-CM | POA: Diagnosis not present

## 2017-05-30 DIAGNOSIS — Z0000 Encounter for general adult medical examination without abnormal findings: Secondary | ICD-10-CM | POA: Diagnosis not present

## 2017-05-30 MED FILL — VALACYCLOVIR HCL 500 MG TAB: 500 | 30 days supply | Qty: 30 | Fill #0

## 2017-05-30 MED FILL — MELOXICAM 7.5 MG TABLET: 7.5 | 90 days supply | Qty: 90 | Fill #0

## 2017-05-31 ENCOUNTER — Other Ambulatory Visit: Payer: Self-pay | Admitting: Obstetrics

## 2017-05-31 DIAGNOSIS — R928 Other abnormal and inconclusive findings on diagnostic imaging of breast: Secondary | ICD-10-CM

## 2017-06-04 ENCOUNTER — Ambulatory Visit: Payer: Self-pay

## 2017-06-04 ENCOUNTER — Ambulatory Visit
Admission: RE | Admit: 2017-06-04 | Discharge: 2017-06-04 | Disposition: A | Discharge status: Home / Self Care | Payer: 59 | Source: Ambulatory Visit | Attending: Obstetrics | Admitting: Obstetrics

## 2017-06-04 DIAGNOSIS — R928 Other abnormal and inconclusive findings on diagnostic imaging of breast: Secondary | ICD-10-CM

## 2017-06-04 DIAGNOSIS — R922 Inconclusive mammogram: Secondary | ICD-10-CM | POA: Diagnosis not present

## 2017-06-05 ENCOUNTER — Other Ambulatory Visit: Payer: Self-pay | Admitting: Obstetrics

## 2017-07-22 MED FILL — DESONIDE 0.05% CREAM: 0.05 | 15 days supply | Qty: 30 | Fill #1

## 2017-07-24 DIAGNOSIS — Z6829 Body mass index (BMI) 29.0-29.9, adult: Secondary | ICD-10-CM | POA: Diagnosis not present

## 2017-07-24 DIAGNOSIS — B359 Dermatophytosis, unspecified: Secondary | ICD-10-CM | POA: Diagnosis not present

## 2017-07-24 MED FILL — NYSTATIN 100,000 UNITS/GM O: 100000 | 15 days supply | Qty: 30 | Fill #0

## 2017-07-31 DIAGNOSIS — B354 Tinea corporis: Secondary | ICD-10-CM | POA: Diagnosis not present

## 2017-07-31 DIAGNOSIS — Z6828 Body mass index (BMI) 28.0-28.9, adult: Secondary | ICD-10-CM | POA: Diagnosis not present

## 2017-08-30 MED FILL — MELOXICAM 7.5 MG TABLET: 7.5 | 90 days supply | Qty: 90 | Fill #1

## 2017-09-12 MED FILL — VALACYCLOVIR HCL 500 MG TAB: 500 | 90 days supply | Qty: 90 | Fill #0

## 2017-10-31 DIAGNOSIS — H5213 Myopia, bilateral: Secondary | ICD-10-CM | POA: Diagnosis not present

## 2017-10-31 DIAGNOSIS — H524 Presbyopia: Secondary | ICD-10-CM | POA: Diagnosis not present

## 2017-10-31 DIAGNOSIS — H40013 Open angle with borderline findings, low risk, bilateral: Secondary | ICD-10-CM | POA: Diagnosis not present

## 2017-11-14 MED FILL — ACYCLOVIR 5 % CREA: 5 | 14 days supply | Qty: 5 | Fill #0

## 2017-12-03 DIAGNOSIS — Z6830 Body mass index (BMI) 30.0-30.9, adult: Secondary | ICD-10-CM | POA: Diagnosis not present

## 2017-12-03 DIAGNOSIS — B356 Tinea cruris: Secondary | ICD-10-CM | POA: Diagnosis not present

## 2017-12-03 MED FILL — NYSTATIN 100000 UNIT/GM POW: 100000 | 15 days supply | Qty: 15 | Fill #0

## 2017-12-04 MED FILL — MELOXICAM 7.5 MG TABLET: 7.5 | 90 days supply | Qty: 90 | Fill #2

## 2017-12-24 ENCOUNTER — Ambulatory Visit (INDEPENDENT_AMBULATORY_CARE_PROVIDER_SITE_OTHER): Payer: 59 | Admitting: Orthopaedic Surgery

## 2017-12-24 ENCOUNTER — Ambulatory Visit (INDEPENDENT_AMBULATORY_CARE_PROVIDER_SITE_OTHER): Payer: 59

## 2017-12-24 DIAGNOSIS — M25562 Pain in left knee: Secondary | ICD-10-CM

## 2017-12-24 MED ORDER — METHYLPREDNISOLONE ACETATE 40 MG/ML IJ SUSP
40.0000 mg | INTRAMUSCULAR | Status: AC | PRN
  Administered 2017-12-24: 40 mg via INTRA_ARTICULAR

## 2017-12-24 MED ORDER — LIDOCAINE HCL 1 % IJ SOLN
2.0000 mL | INTRAMUSCULAR | Status: AC | PRN
  Administered 2017-12-24: 2 mL

## 2017-12-24 MED ORDER — BUPIVACAINE HCL 0.5 % IJ SOLN
2.0000 mL | INTRAMUSCULAR | Status: AC | PRN
  Administered 2017-12-24: 2 mL via INTRA_ARTICULAR

## 2017-12-24 NOTE — Progress Notes (Signed)
Office Visit Note   Patient: Kathleen Vang           Date of Birth: 29-May-1970           MRN: [id number] Visit Date: 12/24/2017              Requested by: No referring provider defined for this encounter. PCP: Patient, No Pcp Per   Assessment & Plan: Visit Diagnoses:  1. Acute pain of left knee     Plan: Impression is left knee arthritis exacerbation.  We performed an aspiration and injection today in the office which she tolerated well.  Minimal effusion was aspirated, not enough for analysis.  RICE as needed.    Follow-Up Instructions: Return if symptoms worsen or fail to improve.   Orders:  Orders Placed This Encounter  Procedures  . XR Knee 1-2 Views Left   No orders of the defined types were placed in this encounter.     Procedures: Large Joint Inj: L knee on 12/24/2017 1:33 PM Details: 22 G needle Medications: 2 mL bupivacaine 0.5 %; 2 mL lidocaine 1 %; 40 mg methylPREDNISolone acetate 40 MG/ML Outcome: tolerated well, no immediate complications Patient was prepped and draped in the usual sterile fashion.       Clinical Data: No additional findings.   Subjective: Chief Complaint  Patient presents with  . Left Knee - Pain    Kathleen Vang is a 47 year old female who works in the preop holding area at [employer] comes in with acute left knee pain for a couple weeks.  She denies any injuries.  She does endorse swelling of the left knee that has improved slightly.  She denies any constitutional symptoms.  Denies a history of gout or pseudogout or inflammatory arthritis.  Denies any numbness and tingling.  Denies any instability or mechanical symptoms.  She has been taking meloxicam with partial relief.  She is walking with a slight limp.   Review of Systems  Constitutional: Negative.   HENT: Negative.   Eyes: Negative.   Respiratory: Negative.   Cardiovascular: Negative.   Endocrine: Negative.   Musculoskeletal: Negative.   Neurological: Negative.     Hematological: Negative.   Psychiatric/Behavioral: Negative.   All other systems reviewed and are negative.    Objective: Vital Signs: There were no vitals taken for this visit.  Physical Exam  Constitutional: She is oriented to person, place, and time. She appears well-developed and well-nourished.  HENT:  Head: Normocephalic and atraumatic.  Eyes: EOM are normal.  Neck: Neck supple.  Pulmonary/Chest: Effort normal.  Abdominal: Soft.  Neurological: She is alert and oriented to person, place, and time.  Skin: Skin is warm. Capillary refill takes less than 2 seconds.  Psychiatric: She has a normal mood and affect. Her behavior is normal. Judgment and thought content normal.  Nursing note and vitals reviewed.   Ortho Exam Antalgic gait.  Left knee shows a small joint effusion.  Collaterals and cruciates are stable.  No joint line tenderness. Specialty Comments:  No specialty comments available.  Imaging: Xr Knee 1-2 Views Left  Result Date: 12/24/2017 No significant degenerative joint disease or joint space narrowing.    PMFS History: Patient Active Problem List   Diagnosis Date Noted  . GOITER, UNSPECIFIED 04/28/2007  . DIZZINESS 04/28/2007  . TONSILLAR HYPERTROPHY, UNILATERAL 08/27/2006  . SARCOIDOSIS 04/03/2006  . ANEMIA NOS 04/03/2006  . TOBACCO ABUSE 04/03/2006  . HEADACHE, TENSION 04/03/2006  . DISORDER, DEPRESSIVE NEC 04/03/2006  . HIATAL  HERNIA WITH REFLUX 04/03/2006  . EPICONDYLITIS, LATERAL 04/03/2006  . INSOMNIA 04/03/2006  . HELICOBACTER PYLORI GASTRITIS, HX OF 04/03/2006   Past Medical History:  Diagnosis Date  . Anemia   . Herpes genitalis in women 2010  . MRSA infection greater than 3 months ago 2009  . Sarcoid 1993    Family History  Problem Relation Age of Onset  . Other Mother     Past Surgical History:  Procedure Laterality Date  . LYMPH NODE BIOPSY  1993  . TUBAL LIGATION     Social History   Occupational History  . Not on  file  Tobacco Use  . Smoking status: Current Every Day Smoker    Packs/day: 0.25    Types: Cigarettes  . Smokeless tobacco: Never Used  Substance and Sexual Activity  . Alcohol use: Yes    Alcohol/week: 7.0 standard drinks    Types: 7 Cans of beer per week  . Drug use: No  . Sexual activity: Yes    Birth control/protection: None, Surgical

## 2017-12-25 ENCOUNTER — Telehealth (INDEPENDENT_AMBULATORY_CARE_PROVIDER_SITE_OTHER): Payer: Self-pay | Admitting: Orthopaedic Surgery

## 2017-12-25 NOTE — Telephone Encounter (Signed)
See message.

## 2017-12-25 NOTE — Telephone Encounter (Signed)
Patient called this morning stating that her knee is aching, she would like to know if this is normal or not.  CB#[phone].  Thank you

## 2017-12-25 NOTE — Telephone Encounter (Signed)
Yes it's normal to have it ache after the procedure we did.

## 2017-12-25 NOTE — Telephone Encounter (Signed)
Called to advise on message below.   

## 2018-01-03 ENCOUNTER — Other Ambulatory Visit (INDEPENDENT_AMBULATORY_CARE_PROVIDER_SITE_OTHER): Payer: Self-pay | Admitting: Physician Assistant

## 2018-01-03 ENCOUNTER — Telehealth (INDEPENDENT_AMBULATORY_CARE_PROVIDER_SITE_OTHER): Payer: Self-pay | Admitting: Orthopaedic Surgery

## 2018-01-03 ENCOUNTER — Other Ambulatory Visit (INDEPENDENT_AMBULATORY_CARE_PROVIDER_SITE_OTHER): Payer: Self-pay

## 2018-01-03 MED ORDER — ACETAMINOPHEN-CODEINE #3 300-30 MG PO TABS: 1.0000 | ORAL_TABLET | Freq: Two times a day (BID) | ORAL | 0 refills | Status: DC | PRN

## 2018-01-03 MED FILL — ACETAMINOPHEN/COD #3 TABLET: 300-30 | 15 days supply | Qty: 30 | Fill #0

## 2018-01-03 NOTE — Telephone Encounter (Signed)
Rx ready for pick up at the front desk patient aware.  

## 2018-01-03 NOTE — Telephone Encounter (Signed)
Noted  

## 2018-01-03 NOTE — Telephone Encounter (Signed)
Kathleen Vang: Please Submit for Gel inj for left knee-Dr XU/Kathleen Vang.   Kathleen Vang: Patient states she Cannot take Tramadol makes her sick. What else would you be able to prescribe? States she has already tried OTC and doesn't seem to help.

## 2018-01-03 NOTE — Telephone Encounter (Signed)
Please advise,

## 2018-01-03 NOTE — Telephone Encounter (Signed)
We can go ahead and get approval for visco injection.  In the meantime, I am happy to call in tramadol if she would like as it appears she is already taking an nsaid.

## 2018-01-03 NOTE — Telephone Encounter (Signed)
Duplicate

## 2018-01-03 NOTE — Telephone Encounter (Signed)
Patient said her left knee is starting to hurt her again, so bad that she had to leave work. She received an injection the last time and wants to know what she can do from here? Please advise # [phone]

## 2018-01-03 NOTE — Telephone Encounter (Signed)
April: Please Submit for Gel inj for left knee-Dr XU/Lindsey.

## 2018-01-03 NOTE — Telephone Encounter (Signed)
Tylenol #3  or we can try a different nsaid.  Cannot write anything stronger than tylenol #3

## 2018-01-07 MED FILL — VALACYCLOVIR HCL 500 MG TAB: 500 | 90 days supply | Qty: 90 | Fill #1

## 2018-01-20 MED FILL — DESONIDE 0.05% CREAM: 0.05 | 15 days supply | Qty: 30 | Fill #0

## 2018-01-21 ENCOUNTER — Telehealth (INDEPENDENT_AMBULATORY_CARE_PROVIDER_SITE_OTHER): Payer: Self-pay

## 2018-01-21 NOTE — Telephone Encounter (Signed)
Submitted VOB for Monovisc, left knee. 

## 2018-02-11 DIAGNOSIS — L811 Chloasma: Secondary | ICD-10-CM | POA: Diagnosis not present

## 2018-02-13 ENCOUNTER — Telehealth (INDEPENDENT_AMBULATORY_CARE_PROVIDER_SITE_OTHER): Payer: Self-pay

## 2018-02-13 NOTE — Telephone Encounter (Signed)
Talked with patient and advised her that she is approved for gel injection.  Approved for Monovisc, left knee. Buy & Bill Patient will be responsible for 20% OOP. No Co-pay No PA required  Appt. 03/04/2018 with Dr. Roda ShuttersXu

## 2018-02-28 DIAGNOSIS — H1032 Unspecified acute conjunctivitis, left eye: Secondary | ICD-10-CM | POA: Diagnosis not present

## 2018-03-04 ENCOUNTER — Ambulatory Visit (INDEPENDENT_AMBULATORY_CARE_PROVIDER_SITE_OTHER): Payer: 59 | Admitting: Orthopaedic Surgery

## 2018-03-13 MED FILL — MELOXICAM 7.5 MG TABLET: 7.5 | 90 days supply | Qty: 90 | Fill #3

## 2018-03-19 ENCOUNTER — Ambulatory Visit (INDEPENDENT_AMBULATORY_CARE_PROVIDER_SITE_OTHER): Payer: 59 | Admitting: Orthopaedic Surgery

## 2018-04-07 MED FILL — VALACYCLOVIR HCL 500 MG TAB: 500 | 90 days supply | Qty: 90 | Fill #2

## 2018-05-27 MED FILL — VALACYCLOVIR HCL 500 MG TAB: 500 | 90 days supply | Qty: 90 | Fill #0

## 2018-06-27 DIAGNOSIS — Z719 Counseling, unspecified: Secondary | ICD-10-CM | POA: Diagnosis not present

## 2018-06-27 DIAGNOSIS — M25511 Pain in right shoulder: Secondary | ICD-10-CM | POA: Diagnosis not present

## 2018-06-27 MED FILL — MELOXICAM 7.5 MG TABLET: 7.5 | 90 days supply | Qty: 90 | Fill #0

## 2018-08-14 DIAGNOSIS — Z01419 Encounter for gynecological examination (general) (routine) without abnormal findings: Secondary | ICD-10-CM | POA: Diagnosis not present

## 2018-08-14 DIAGNOSIS — Z1231 Encounter for screening mammogram for malignant neoplasm of breast: Secondary | ICD-10-CM | POA: Diagnosis not present

## 2018-08-14 MED FILL — VALACYCLOVIR HCL 500 MG TAB: 500 | 90 days supply | Qty: 90 | Fill #0

## 2018-09-01 DIAGNOSIS — I781 Nevus, non-neoplastic: Secondary | ICD-10-CM | POA: Diagnosis not present

## 2018-09-01 DIAGNOSIS — L309 Dermatitis, unspecified: Secondary | ICD-10-CM | POA: Diagnosis not present

## 2018-09-01 MED FILL — FLUOCINONIDE 0.05% OINTMENT: 0.05 | 30 days supply | Qty: 30 | Fill #0

## 2018-09-01 MED FILL — DESONIDE 0.05% CREAM: 0.05 | 30 days supply | Qty: 15 | Fill #0

## 2018-09-05 DIAGNOSIS — S46811A Strain of other muscles, fascia and tendons at shoulder and upper arm level, right arm, initial encounter: Secondary | ICD-10-CM | POA: Diagnosis not present

## 2018-09-05 DIAGNOSIS — M25511 Pain in right shoulder: Secondary | ICD-10-CM | POA: Diagnosis not present

## 2018-09-05 DIAGNOSIS — M25811 Other specified joint disorders, right shoulder: Secondary | ICD-10-CM | POA: Diagnosis not present

## 2018-09-16 DIAGNOSIS — S46811D Strain of other muscles, fascia and tendons at shoulder and upper arm level, right arm, subsequent encounter: Secondary | ICD-10-CM | POA: Diagnosis not present

## 2018-09-16 MED FILL — predniSONE 20 MG TABS: 20 | 12 days supply | Qty: 21 | Fill #0

## 2018-09-16 MED FILL — tiZANidine HCL 4 MG TABS: 4 | 8 days supply | Qty: 30 | Fill #0

## 2018-10-02 ENCOUNTER — Encounter (HOSPITAL_COMMUNITY)
Admission: RE | Admit: 2018-10-02 | Discharge: 2018-10-02 | Disposition: A | Discharge status: Home / Self Care | Payer: 59 | Source: Ambulatory Visit | Attending: Family Medicine | Admitting: Family Medicine

## 2018-10-02 ENCOUNTER — Other Ambulatory Visit: Payer: Self-pay

## 2018-10-02 DIAGNOSIS — Z1159 Encounter for screening for other viral diseases: Secondary | ICD-10-CM | POA: Diagnosis not present

## 2018-10-02 DIAGNOSIS — Z01812 Encounter for preprocedural laboratory examination: Secondary | ICD-10-CM | POA: Diagnosis not present

## 2018-10-02 LAB — LIPID PANEL
Cholesterol: 189 mg/dL (ref 0–200)
HDL: 69 mg/dL (ref >40)
LDL Cholesterol: 96 mg/dL (ref 0–99)
Total CHOL/HDL Ratio: 2.7 RATIO
Triglycerides: 120 mg/dL (ref <150)
VLDL: 24 mg/dL (ref 0–40)

## 2018-10-02 LAB — COMPREHENSIVE METABOLIC PANEL
ALT: 22 U/L (ref 0–44)
AST: 29 U/L (ref 15–41)
Albumin: 3.8 g/dL (ref 3.5–5.0)
Alkaline Phosphatase: 42 U/L (ref 38–126)
Anion gap: 9 (ref 5–15)
BUN: 12 mg/dL (ref 6–20)
CO2: 21 mmol/L — ABNORMAL LOW (ref 22–32)
Calcium: 9 mg/dL (ref 8.9–10.3)
Chloride: 109 mmol/L (ref 98–111)
Creatinine, Ser: 0.84 mg/dL (ref 0.44–1.00)
GFR calc Af Amer: >60 mL/min (ref >60)
GFR calc non Af Amer: >60 mL/min (ref >60)
Glucose, Bld: 88 mg/dL (ref 70–99)
Potassium: 4.1 mmol/L (ref 3.5–5.1)
Sodium: 139 mmol/L (ref 135–145)
Total Bilirubin: 0.8 mg/dL (ref 0.3–1.2)
Total Protein: 6.8 g/dL (ref 6.5–8.1)

## 2018-10-02 LAB — CBC WITH DIFFERENTIAL/PLATELET
Abs Immature Granulocytes: 0.02 10*3/uL (ref 0.00–0.07)
Basophils Absolute: 0 10*3/uL (ref 0.0–0.1)
Basophils Relative: 1 %
Eosinophils Absolute: 0 10*3/uL (ref 0.0–0.5)
Eosinophils Relative: 1 %
HCT: 41.4 % (ref 36.0–46.0)
Hemoglobin: 13.7 g/dL (ref 12.0–15.0)
Immature Granulocytes: 0 %
Lymphocytes Relative: 32 %
Lymphs Abs: 1.8 10*3/uL (ref 0.7–4.0)
MCH: 32.8 pg (ref 26.0–34.0)
MCHC: 33.1 g/dL (ref 30.0–36.0)
MCV: 99 fL (ref 80.0–100.0)
Monocytes Absolute: 0.6 10*3/uL (ref 0.1–1.0)
Monocytes Relative: 10 %
Neutro Abs: 3.2 10*3/uL (ref 1.7–7.7)
Neutrophils Relative %: 56 %
Platelets: 331 10*3/uL (ref 150–400)
RBC: 4.18 MIL/uL (ref 3.87–5.11)
RDW: 12.4 % (ref 11.5–15.5)
WBC: 5.6 10*3/uL (ref 4.0–10.5)
nRBC: 0 % (ref 0.0–0.2)

## 2018-10-02 LAB — TSH: TSH: 0.958 u[IU]/mL (ref 0.350–4.500)

## 2018-10-03 LAB — HIV ANTIBODY (ROUTINE TESTING W REFLEX): HIV Screen 4th Generation wRfx: NONREACTIVE

## 2018-10-08 DIAGNOSIS — Z1211 Encounter for screening for malignant neoplasm of colon: Secondary | ICD-10-CM | POA: Diagnosis not present

## 2018-10-08 DIAGNOSIS — M25511 Pain in right shoulder: Secondary | ICD-10-CM | POA: Diagnosis not present

## 2018-10-08 DIAGNOSIS — Z0000 Encounter for general adult medical examination without abnormal findings: Secondary | ICD-10-CM | POA: Diagnosis not present

## 2018-10-08 DIAGNOSIS — Z6828 Body mass index (BMI) 28.0-28.9, adult: Secondary | ICD-10-CM | POA: Diagnosis not present

## 2018-10-10 ENCOUNTER — Encounter: Payer: Self-pay | Admitting: Gastroenterology

## 2018-10-10 MED FILL — OMRON 3 SERIES BP MONITOR D: 30 days supply | Qty: 1 | Fill #0

## 2018-10-22 ENCOUNTER — Other Ambulatory Visit: Payer: Self-pay

## 2018-10-22 ENCOUNTER — Ambulatory Visit (AMBULATORY_SURGERY_CENTER): Payer: Self-pay | Admitting: *Deleted

## 2018-10-22 VITALS — Temp 97.5°F | Ht 62.0 in | Wt 174.0 lb

## 2018-10-22 DIAGNOSIS — Z1211 Encounter for screening for malignant neoplasm of colon: Secondary | ICD-10-CM

## 2018-10-22 MED ORDER — NA SULFATE-K SULFATE-MG SULF 17.5-3.13-1.6 GM/177ML PO SOLN: ORAL | 0 refills | Status: DC

## 2018-10-22 MED FILL — SUPREP BOWEL PREP KIT: 17.5-3.13-1 | 1 days supply | Qty: 354 | Fill #0

## 2018-10-22 NOTE — Progress Notes (Signed)
Patient is here in person for PV today. Patient denies any allergies to eggs or soy. Patient denies any problems with anesthesia/sedation. Patient denies any oxygen use at home. Patient denies taking any diet/weight loss medications or blood thinners. EMMI education assisgned to patient on colonoscopy, this was explained and instructions given to patient.Pt is aware that care partner will wait in the car during procedure; if they feel like they will be too hot to wait in the car; they may wait in the lobby.  We want them to wear a mask (we do not have any that we can provide them), practice social distancing, and we will check their temperatures when they get here.  I did remind patient that their care partner needs to stay in the parking lot the entire time. Pt will wear mask into building. 

## 2018-10-29 ENCOUNTER — Encounter: Payer: Self-pay | Admitting: Gastroenterology

## 2018-11-04 ENCOUNTER — Telehealth: Payer: Self-pay

## 2018-11-04 NOTE — Telephone Encounter (Signed)
Covid-19 screening questions   Do you now or have you had a fever in the last 14 days?  Do you have any respiratory symptoms of shortness of breath or cough now or in the last 14 days?  Do you have any family members or close contacts with diagnosed or suspected Covid-19 in the past 14 days?  Have you been tested for Covid-19 and found to be positive?       

## 2018-11-04 NOTE — Telephone Encounter (Signed)
Pt returned call and answered “No” to all questions.  °  °Pt made aware of that care partner may come to the lobby during the procedure but will need to provide their own mask. ° ° °

## 2018-11-05 ENCOUNTER — Ambulatory Visit (AMBULATORY_SURGERY_CENTER): Payer: 59 | Admitting: Gastroenterology

## 2018-11-05 ENCOUNTER — Encounter: Payer: Self-pay | Admitting: Gastroenterology

## 2018-11-05 ENCOUNTER — Other Ambulatory Visit: Payer: Self-pay

## 2018-11-05 VITALS — BP 120/85 | HR 77 | Temp 98.7°F | Resp 12 | Ht 62.0 in | Wt 174.0 lb

## 2018-11-05 DIAGNOSIS — Z1211 Encounter for screening for malignant neoplasm of colon: Secondary | ICD-10-CM

## 2018-11-05 MED ORDER — SODIUM CHLORIDE 0.9 % IV SOLN: 500.0000 mL | Freq: Once | INTRAVENOUS | Status: DC

## 2018-11-05 NOTE — Progress Notes (Signed)
PT taken to PACU. Monitors in place. VSS. Report given to RN. 

## 2018-11-05 NOTE — Progress Notes (Signed)
Pt's states no medical or surgical changes since previsit or office visit.  June Bullock - temps Courtney Washington - vitals 

## 2018-11-05 NOTE — Op Note (Signed)
[facility] Patient Name: Kathleen Vang Procedure Date: 11/05/2018 8:00 AM MRN: [id number] Endoscopist: Napoleon FormKavitha V. Myshawn Chiriboga , MD Age: 48 Referring MD:  Date of Birth: 1970-04-30 Gender: Female Account #: [id number] Procedure:                Colonoscopy Indications:              Screening for colorectal malignant neoplasm Medicines:                Monitored Anesthesia Care Procedure:                Pre-Anesthesia Assessment:                           - Prior to the procedure, a History and Physical                            was performed, and patient medications and                            allergies were reviewed. The patient's tolerance of                            previous anesthesia was also reviewed. The risks                            and benefits of the procedure and the sedation                            options and risks were discussed with the patient.                            All questions were answered, and informed consent                            was obtained. Prior Anticoagulants: The patient has                            taken no previous anticoagulant or antiplatelet                            agents. ASA Grade Assessment: I - A normal, healthy                            patient. After reviewing the risks and benefits,                            the patient was deemed in satisfactory condition to                            undergo the procedure.                           After obtaining informed consent, the colonoscope  was passed under direct vision. Throughout the                            procedure, the patient's blood pressure, pulse, and                            oxygen saturations were monitored continuously. The                            Colonoscope was introduced through the anus and                            advanced to the the cecum, identified by                            appendiceal orifice and ileocecal  valve. The                            colonoscopy was performed without difficulty. The                            patient tolerated the procedure well. The quality                            of the bowel preparation was excellent. The                            ileocecal valve, appendiceal orifice, and rectum                            were photographed. Scope In: 8:07:20 AM Scope Out: 8:19:31 AM Scope Withdrawal Time: 0 hours 7 minutes 48 seconds  Total Procedure Duration: 0 hours 12 minutes 11 seconds  Findings:                 The perianal and digital rectal examinations were                            normal.                           Scattered small and large-mouthed diverticula were                            found in the sigmoid colon and descending colon.                           Non-bleeding internal hemorrhoids were found during                            retroflexion. The hemorrhoids were medium-sized.                           The exam was otherwise without abnormality. Complications:            No immediate complications. Estimated Blood Loss:  Estimated blood loss: none. Impression:               - Mild diverticulosis in the sigmoid colon and in                            the descending colon.                           - Non-bleeding internal hemorrhoids.                           - The examination was otherwise normal.                           - No specimens collected. Recommendation:           - Patient has a contact number available for                            emergencies. The signs and symptoms of potential                            delayed complications were discussed with the                            patient. Return to normal activities tomorrow.                            Written discharge instructions were provided to the                            patient.                           - Resume previous diet.                           - Continue present  medications.                           - Repeat colonoscopy in 10 years for screening                            purposes. Napoleon Form, MD 11/05/2018 8:24:15 AM This report has been signed electronically.

## 2018-11-05 NOTE — Patient Instructions (Signed)
Please read handouts provided. Continue present medications.     YOU HAD AN ENDOSCOPIC PROCEDURE TODAY AT THE [facility]:   Refer to the procedure report that was given to you for any specific questions about what was found during the examination.  If the procedure report does not answer your questions, please call your gastroenterologist to clarify.  If you requested that your care partner not be given the details of your procedure findings, then the procedure report has been included in a sealed envelope for you to review at your convenience later.  YOU SHOULD EXPECT: Some feelings of bloating in the abdomen. Passage of more gas than usual.  Walking can help get rid of the air that was put into your GI tract during the procedure and reduce the bloating. If you had a lower endoscopy (such as a colonoscopy or flexible sigmoidoscopy) you may notice spotting of blood in your stool or on the toilet paper. If you underwent a bowel prep for your procedure, you may not have a normal bowel movement for a few days.  Please Note:  You might notice some irritation and congestion in your nose or some drainage.  This is from the oxygen used during your procedure.  There is no need for concern and it should clear up in a day or so.  SYMPTOMS TO REPORT IMMEDIATELY:   Following lower endoscopy (colonoscopy or flexible sigmoidoscopy):  Excessive amounts of blood in the stool  Significant tenderness or worsening of abdominal pains  Swelling of the abdomen that is new, acute  Fever of 100F or higher   For urgent or emergent issues, a gastroenterologist can be reached at any hour by calling [phone].   DIET:  We do recommend a small meal at first, but then you may proceed to your regular diet.  Drink plenty of fluids but you should avoid alcoholic beverages for 24 hours.  ACTIVITY:  You should plan to take it easy for the rest of today and you should NOT DRIVE or use heavy machinery  until tomorrow (because of the sedation medicines used during the test).    FOLLOW UP: Our staff will call the number listed on your records 48-72 hours following your procedure to check on you and address any questions or concerns that you may have regarding the information given to you following your procedure. If we do not reach you, we will leave a message.  We will attempt to reach you two times.  During this call, we will ask if you have developed any symptoms of COVID 19. If you develop any symptoms (ie: fever, flu-like symptoms, shortness of breath, cough etc.) before then, please call [phone].  If you test positive for Covid 19 in the 2 weeks post procedure, please call and report this information to us.    If any biopsies were taken you will be contacted by phone or by letter within the next 1-3 weeks.  Please call us at [phone] if you have not heard about the biopsies in 3 weeks.    SIGNATURES/CONFIDENTIALITY: You and/or your care partner have signed paperwork which will be entered into your electronic medical record.  These signatures attest to the fact that that the information above on your After Visit Summary has been reviewed and is understood.  Full responsibility of the confidentiality of this discharge information lies with you and/or your care-partner. 

## 2018-11-06 DIAGNOSIS — L03032 Cellulitis of left toe: Secondary | ICD-10-CM | POA: Diagnosis not present

## 2018-11-06 DIAGNOSIS — M76822 Posterior tibial tendinitis, left leg: Secondary | ICD-10-CM | POA: Diagnosis not present

## 2018-11-07 ENCOUNTER — Telehealth: Payer: Self-pay

## 2018-11-07 NOTE — Telephone Encounter (Signed)
Attempted to reach pt. With follow-up call following endoscopic procedure 11/05/2018.  LM on pt. Ans. Machine.  Will try to call pt. Again later today.

## 2018-11-07 NOTE — Telephone Encounter (Signed)
  Follow up Call-  Call back number 11/05/2018  Post procedure Call Back phone  # [phone]  Permission to leave phone message Yes  Some recent data might be hidden     Patient questions:  Do you have a fever, pain , or abdominal swelling? No. Pain Score  0 *  Have you tolerated food without any problems? Yes.    Have you been able to return to your normal activities? Yes.    Do you have any questions about your discharge instructions: Diet   No. Medications  No. Follow up visit  No.  Do you have questions or concerns about your Care? No.  Actions: * If pain score is 4 or above: No action needed, pain <4. 1. Have you developed a fever since your procedure? no  2.   Have you had an respiratory symptoms (SOB or cough) since your procedure? no  3.   Have you tested positive for COVID 19 since your procedure no  4.   Have you had any family members/close contacts diagnosed with the COVID 19 since your procedure?  no   If yes to any of these questions please route to Joylene John, RN and Alphonsa Gin, Therapist, sports.

## 2018-11-11 DIAGNOSIS — R030 Elevated blood-pressure reading, without diagnosis of hypertension: Secondary | ICD-10-CM | POA: Diagnosis not present

## 2018-11-11 DIAGNOSIS — I10 Essential (primary) hypertension: Secondary | ICD-10-CM | POA: Diagnosis not present

## 2018-11-11 MED FILL — LOSARTAN-HCTZ 50-12.5 MG TA: 50-12.5 | 90 days supply | Qty: 90 | Fill #0

## 2018-12-23 MED FILL — VALACYCLOVIR HCL 500 MG TAB: 500 | 90 days supply | Qty: 90 | Fill #0

## 2019-01-02 DIAGNOSIS — I10 Essential (primary) hypertension: Secondary | ICD-10-CM | POA: Diagnosis not present

## 2019-01-02 MED FILL — LOSARTAN-HCTZ 100-12.5 MG T: 100-12.5 | 90 days supply | Qty: 90 | Fill #0

## 2019-02-13 DIAGNOSIS — I10 Essential (primary) hypertension: Secondary | ICD-10-CM | POA: Diagnosis not present

## 2019-02-13 DIAGNOSIS — Z6830 Body mass index (BMI) 30.0-30.9, adult: Secondary | ICD-10-CM | POA: Diagnosis not present

## 2019-02-13 DIAGNOSIS — R519 Headache, unspecified: Secondary | ICD-10-CM | POA: Diagnosis not present

## 2019-02-13 IMAGING — MG DIGITAL DIAGNOSTIC UNILATERAL LEFT MAMMOGRAM WITH TOMO AND CAD
4 series · 4 of 12 positions shown · non-contrast
Comparison: Previous exam(s).

CLINICAL DATA: Screening recall for asymmetry seen in the left
breast on the MLO view only.

EXAM:
DIGITAL DIAGNOSTIC UNILATERAL LEFT MAMMOGRAM WITH CAD AND TOMO

[L ML synth-2D]
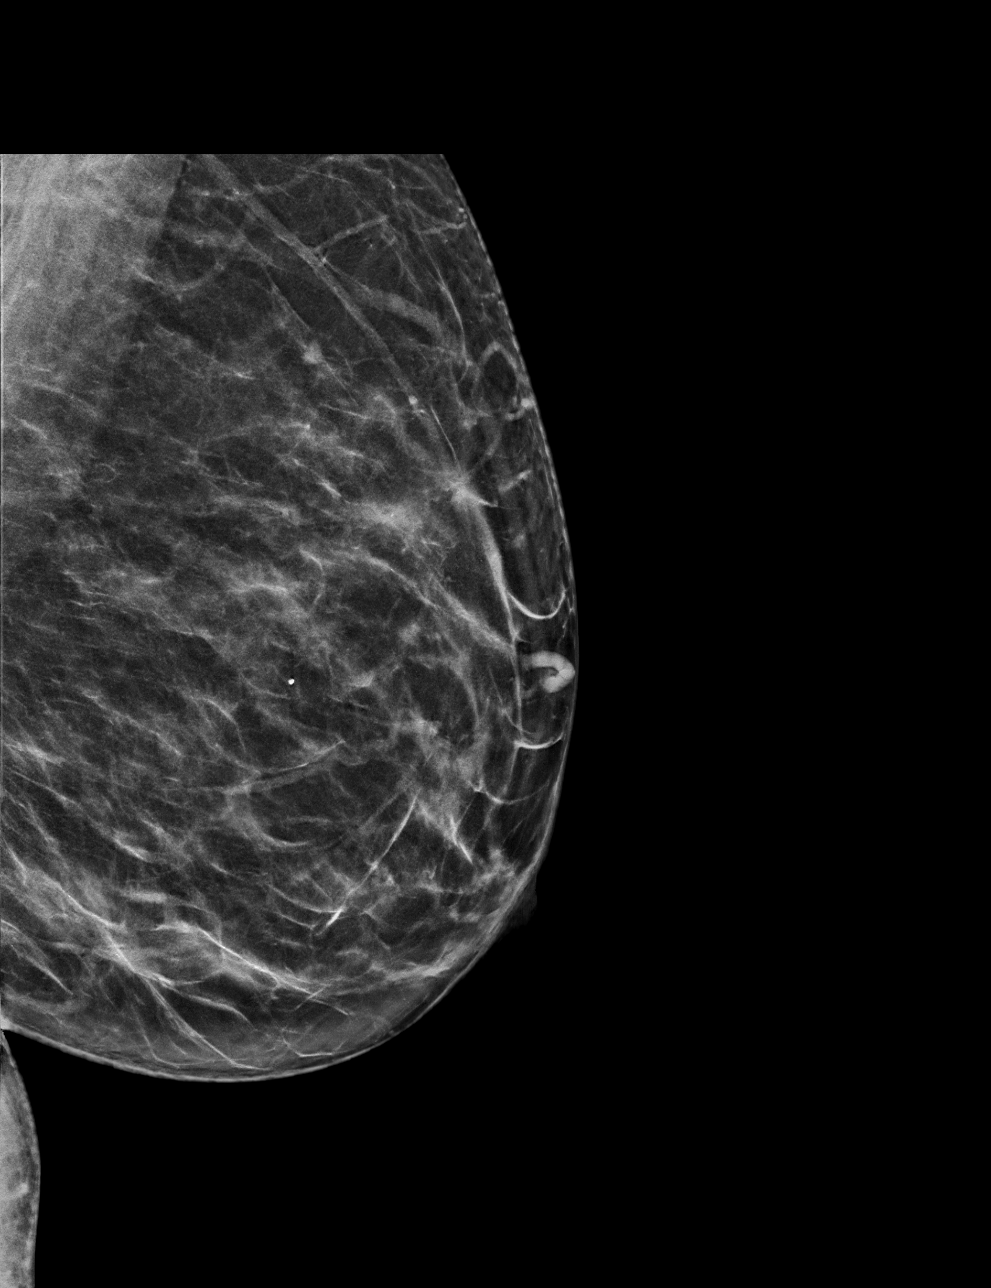

[L MLO synth-2D]
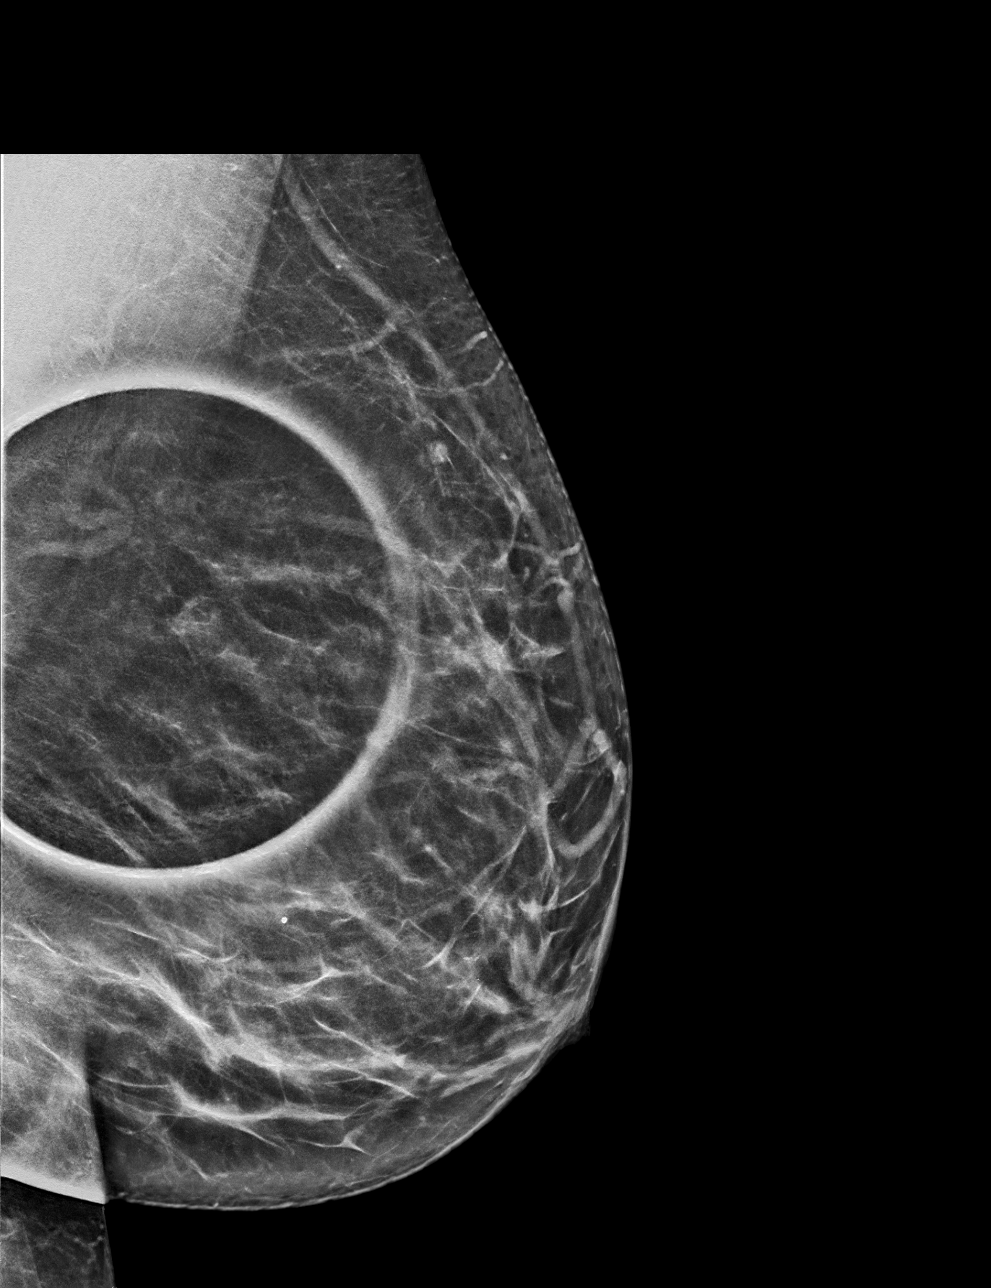

[L MLO tomo · tomo slice 34/67.0]
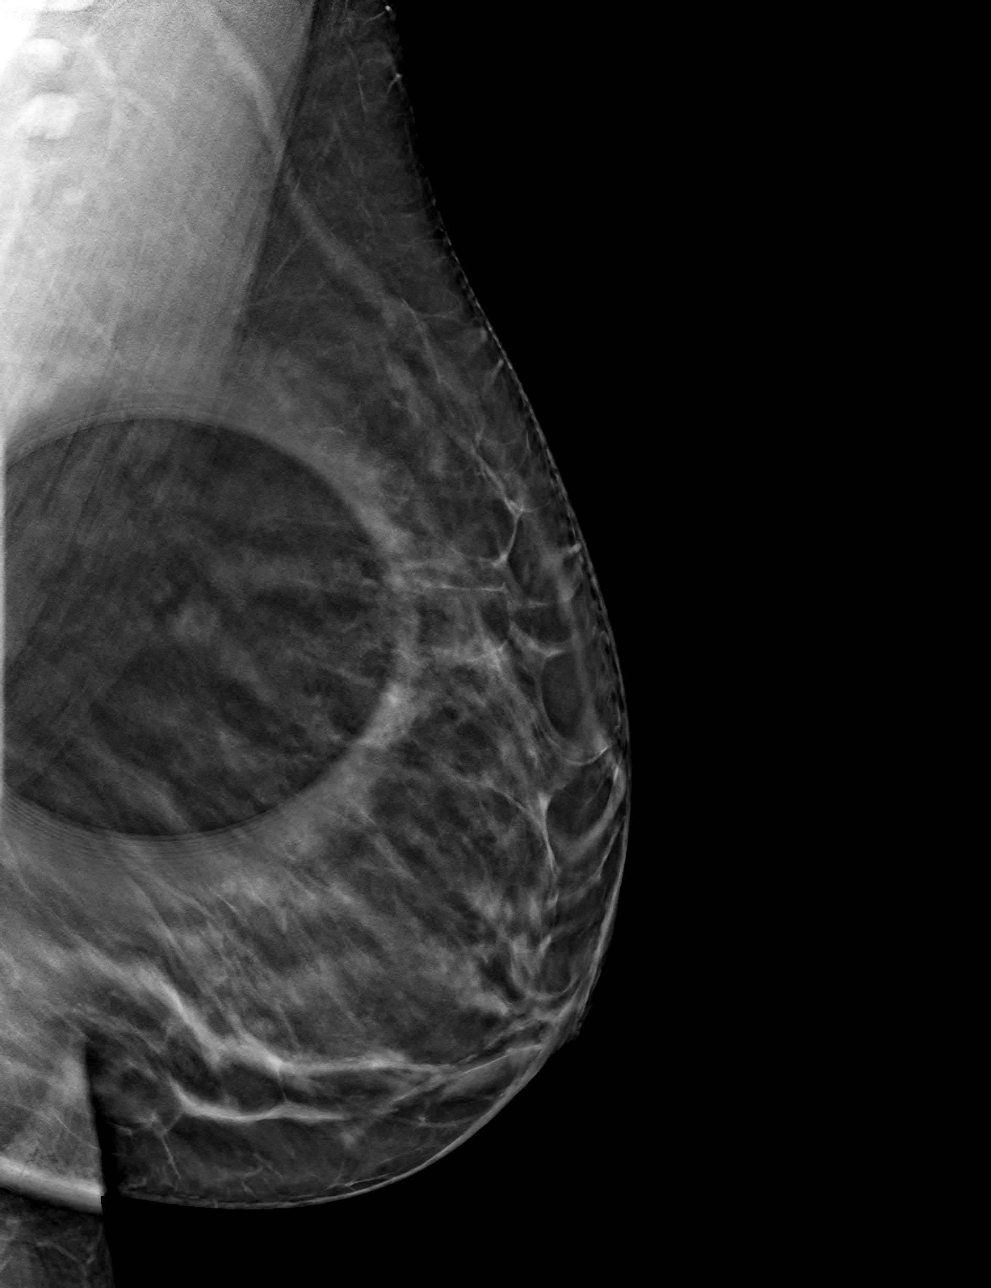

[L ML tomo · tomo slice 33/64.0]
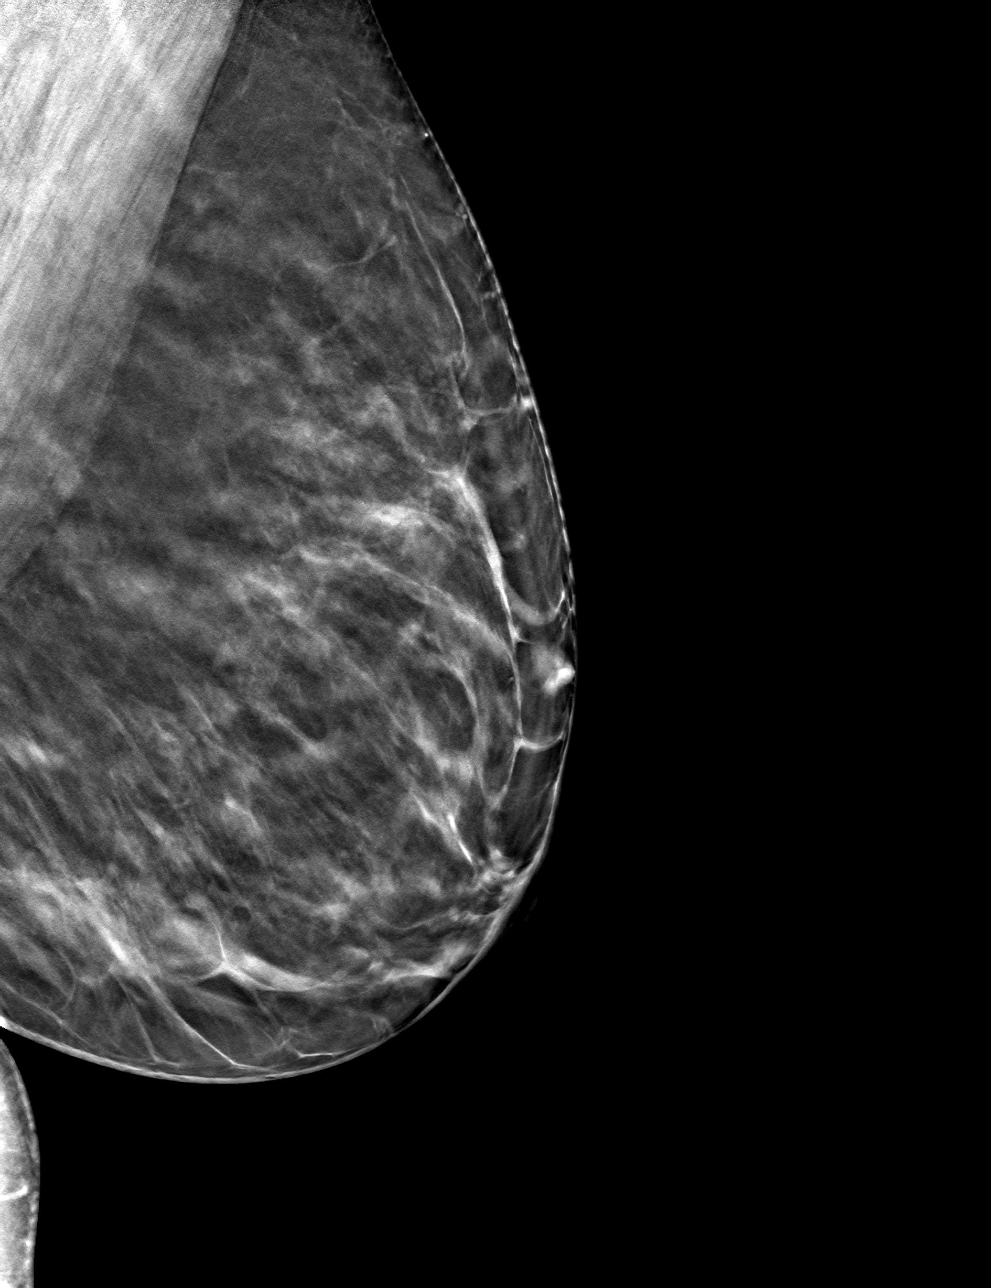

[4 of 12 positions shown; findings below may reference images not displayed]

ACR Breast Density Category c: The breast tissue is heterogeneously
dense, which may obscure small masses.
FINDINGS: Additional views of the left breast were performed. The initially
questioned possible left breast asymmetry resolves on the additional
imaging with findings compatible with overlapping fibroglandular
tissue. There is no mammographic evidence of malignancy in the left
breast.

Mammographic images were processed with CAD.
IMPRESSION: No mammographic evidence of malignancy in the left breast.

RECOMMENDATION:
Recommend annual routine screening mammography.

I have discussed the findings and recommendations with the patient.
Results were also provided in writing at the conclusion of the
visit. If applicable, a reminder letter will be sent to the patient
regarding the next appointment.

BI-RADS CATEGORY  1: Negative.

## 2019-03-11 MED FILL — HYDROCHLOROTHIAZIDE 12.5 MG: 12.5 | 90 days supply | Qty: 90 | Fill #0

## 2019-03-23 MED FILL — VALACYCLOVIR HCL 500 MG TAB: 500 | 90 days supply | Qty: 90 | Fill #1

## 2019-05-14 ENCOUNTER — Other Ambulatory Visit: Payer: Self-pay

## 2019-05-14 ENCOUNTER — Ambulatory Visit: Payer: No Typology Code available for payment source | Attending: Student | Admitting: Physical Therapy

## 2019-05-14 DIAGNOSIS — M25572 Pain in left ankle and joints of left foot: Secondary | ICD-10-CM | POA: Diagnosis not present

## 2019-05-14 DIAGNOSIS — R262 Difficulty in walking, not elsewhere classified: Secondary | ICD-10-CM | POA: Diagnosis present

## 2019-05-14 DIAGNOSIS — M6281 Muscle weakness (generalized): Secondary | ICD-10-CM | POA: Diagnosis present

## 2019-05-14 NOTE — Patient Instructions (Signed)

## 2019-05-14 NOTE — Therapy (Signed)
[place], [place], [address] Phone: [phone]   Fax:  [phone]  Physical Therapy Evaluation  Patient Details  Name: Kathleen Vang MRN: [id number] Date of Birth: April 19, 1970 Referring Provider (PT): Mechele Claude [place]   Encounter Date: 05/14/2019  PT End of Session - 05/14/19 1531    Visit Number  1    Number of Visits  16    Date for PT Re-Evaluation  07/09/19    Authorization Type  [facility] Focus Plan    PT Start Time  1500    PT Stop Time  1546    PT Time Calculation (min)  46 min    Activity Tolerance  Patient limited by pain    Behavior During Therapy  [facility] for tasks assessed/performed       Past Medical History:  Diagnosis Date  . Anemia   . Herpes genitalis in women 2010  . MRSA infection greater than 3 months ago 2009  . Sarcoid 1993   in remission per pt    Past Surgical History:  Procedure Laterality Date  . LYMPH NODE BIOPSY  1993  . TUBAL LIGATION      There were no vitals filed for this visit.   Subjective Assessment - 05/14/19 1505    Subjective  I have worked at [employer] for 18 yrs.  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 I ice it 20 minutes a day. my pain is 9/10 and I sometimes can get out of a chair and stand on it and it feels like I broke it.  It is so painful now    Pertinent History  Sarcoidosis, 5 year LT ankle/ tibialis posterior tendon dysfunction    Limitations  Walking;Standing;House hold activities   sleeping with pain ,hard to calm down   How long can you sit comfortably?  unlimited    How long can you stand comfortably?  10-15 min    How long can you walk comfortably?  15 min on tradmill with brace on    Diagnostic tests  x ray    Patient Stated Goals  Be able to stand and walk on my foot without braces or pain    Currently in Pain?  Yes    Pain Score  9     Pain Location  Foot   posterior tib/navicular    Pain Orientation  Left    Pain Descriptors / Indicators  Sharp;Aching;Sore    Pain Type  Chronic pain    Pain Onset  More than a month ago    Pain Frequency  Constant    Aggravating Factors   standing and walking for longer than 15 min. , cant walk for exericise,  cant play with grand daughter/have her ride bike,    Pain Relieving Factors  ice , meloxicam         [facility] PT Assessment - 05/14/19 0001      Assessment   Medical Diagnosis  disorder of posterior tibial mx tendon, LT ankle pain, .LT navicular bone    Referring Provider (PT)  Mechele Claude PA    Onset Date/Surgical Date  05/14/18   5 years history.   Hand Dominance  Right    Next MD Visit  3 weeks    Prior Therapy  none for LT foot      Precautions   Required Braces or Orthoses  Other Brace/Splint  Other Brace/Splint  ankle brace for arch      Restrictions   Weight Bearing Restrictions  No    Other Position/Activity Restrictions  looks like WBAT due to pain      Balance Screen   Has the patient fallen in the past 6 months  No    Has the patient had a decrease in activity level because of a fear of falling?   No    Is the patient reluctant to leave their home because of a fear of falling?   No      Home Environment   Living Environment  Private residence    Living Arrangements  Spouse/significant other    Type of Home  House    Home Access  Stairs to enter    Entrance Stairs-Number of Steps  7    Entrance Stairs-Rails  Can reach both    Home Layout  One level      Prior Function   Vocation  Full time employment    Vocation Requirements  8 hour shifts on feet all the time      Cognition   Overall Cognitive Status  Within Functional Limits for tasks assessed      Observation/Other Assessments   Focus on Therapeutic Outcomes (FOTO)   FOTO intake 51% limitation 49% predicted 33%      Functional Tests   Functional tests  Squat      Squat   Comments  Pt squats 1/2 way with wt bear to RT      Lunges    Comments  unable to lunge on LT ankle      Single Leg Stance   Comments  LT side 0 sec RT 30 sec      Posture/Postural Control   Posture/Postural Control  Postural limitations    Postural Limitations  Anterior pelvic tilt;Forward head;Rounded Shoulders;Weight shift right    Posture Comments  Pt unable to drop 1st ray of LT foot, flat feet bil LT > RT      ROM / Strength   AROM / PROM / Strength  AROM;Strength      AROM   Overall AROM   Deficits    Right Ankle Dorsiflexion  12   PROM 15   Right Ankle Plantar Flexion  60    Right Ankle Inversion  43    Right Ankle Eversion  32    Left Ankle Dorsiflexion  4   7 pROM painful   Left Ankle Plantar Flexion  44   pain   Left Ankle Inversion  28   PROM 36 painful   Left Ankle Eversion  19   PROM 29 painful     Strength   Overall Strength  Deficits    Right Hip Flexion  5/5    Right Hip ABduction  5/5    Left Hip Flexion  5/5    Left Hip ABduction  4/5    Right Knee Flexion  5/5    Right Knee Extension  5/5    Left Knee Flexion  4/5    Left Knee Extension  4+/5    Right Ankle Plantar Flexion  4+/5    Right Ankle Inversion  4+/5    Left Ankle Plantar Flexion  2/5   painful with one attempt to heel raise   Left Ankle Inversion  3-/5      Palpation   Palpation comment  pt with swelling over LT navicular and dropped, marked tenderness with light touching,  Pt with decreased strength with inversion      Ambulation/Gait   Gait Pattern  Decreased stance time - left;Decreased dorsiflexion - left;Antalgic    Ambulation Surface  Level                Objective measurements completed on examination: See above findings.      [facility] Adult PT Treatment/Exercise - 05/14/19 0001      Iontophoresis   Type of Iontophoresis  Dexamethasone    Location  LT navicular    Dose  1cc    Time  4-6 hour patch             PT Education - 05/14/19 1744    Education Details  POC Explanation of findings intiial HEP,  Iontophoresis patch to LT navicular    Person(s) Educated  Patient    Methods  Explanation;Demonstration;Tactile cues;Verbal cues;Handout    Comprehension  Verbalized understanding;Returned demonstration       PT Short Term Goals - 05/14/19 1722      PT SHORT TERM GOAL #1   Title  Pt will be independent with initial HEP    Time  4    Period  Weeks    Target Date  06/11/19      PT SHORT TERM GOAL #2   Title  Pt will be able to stand on foot for 20  minutes to wash dishes and do minor housework    Time  4    Period  Weeks    Status  New    Target Date  06/11/19      PT SHORT TERM GOAL #3   Title  Pt will be able to perform 10 heel raises    Baseline  unable to perform    Period  Weeks    Status  New    Target Date  06/11/19      PT SHORT TERM GOAL #4   Title  Pt will be fitted for good orthotic and supportive shoe to enhance comfort in standing and walking for work    Time  4    Period  Weeks    Status  New        PT Long Term Goals - 05/14/19 1547      PT LONG TERM GOAL #1   Title  Pt will be independent with advanced HEP    Time  8    Period  Weeks    Status  New    Target Date  07/09/19      PT LONG TERM GOAL #2   Title  Pt will be able to stand for one hour with pain 3/10 or less in LT foot with good shoe/orthotic    Time  8    Period  Weeks    Status  New    Target Date  07/09/19      PT LONG TERM GOAL #3   Title  Pt will be able to ascend /descend 10 steps alternating steps without dependence on UE support    Time  8    Period  Weeks    Status  New    Target Date  07/09/19      PT LONG TERM GOAL #4   Title  FOTO will improve from 49% limitation    to 33%    indicating improved functional mobility.    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 Pt has difficulty standing and walking due to 9/10 pain and has ankle brace to support foot but pain is not relieved.  Pt with weakness of inversion of foot and cannot heel raise due to pain , Pt will benefit from skilled PT to address deficits and limitation that keep her from fully walking, standing for longer than 15 minutes due to pain.  and to eventurally return to walking for exercise and play with 63 year old grand daughter    Personal Factors and Comorbidities  Comorbidity 1    Comorbidities  Sarcoidosis See medical chart    Examination-Activity Limitations  Squat;Stand;Stairs    Examination-Participation Restrictions  Cleaning    Stability/Clinical Decision Making  Evolving/Moderate complexity    Clinical Decision Making  Moderate    Rehab Potential  Good    PT Frequency  2x / week    PT Duration  8 weeks    PT Treatment/Interventions  Cryotherapy;Electrical Stimulation;Ultrasound;Moist Heat;Iontophoresis 4mg /ml Dexamethasone;Gait training;Stair training;Functional mobility training;Therapeutic activities;Therapeutic exercise;Patient/family education;Neuromuscular re-education;Manual techniques;Passive range of motion;Taping;Dry needling;Joint Manipulations    PT Next Visit Plan  Pt to see April Wilson for orthotics  check Iontophoresis patch from last time.  modalities and manual, progress exericise    PT Home Exercise Plan  May    Consulted and Agree with Plan of Care  Patient       Patient will benefit from skilled therapeutic intervention in order to improve the following deficits and impairments:  Abnormal gait, Pain, Improper body  mechanics, Postural dysfunction, Decreased strength, Decreased range of motion, Decreased activity tolerance, Decreased mobility  Visit Diagnosis: Pain in left ankle and joints of left foot  Difficulty in walking, not elsewhere classified  Muscle weakness (generalized)     Problem List Patient Active Problem List   Diagnosis Date Noted  . GOITER, UNSPECIFIED 04/28/2007  . DIZZINESS 04/28/2007  . TONSILLAR HYPERTROPHY, UNILATERAL 08/27/2006  . SARCOIDOSIS 04/03/2006  . ANEMIA NOS 04/03/2006  . TOBACCO ABUSE 04/03/2006  . HEADACHE, TENSION 04/03/2006  . DISORDER, DEPRESSIVE NEC 04/03/2006  . HIATAL HERNIA WITH REFLUX 04/03/2006  . EPICONDYLITIS, LATERAL 04/03/2006  . INSOMNIA 04/03/2006  . HELICOBACTER PYLORI GASTRITIS, HX OF 04/03/2006   04/05/2006, PT Certified Exercise Expert for the Aging Adult  05/14/19 5:58 PM Phone: [phone] Fax: [phone]  [facility] [address] [place], [place], [place] Phone: [phone]   Fax:  [phone]  Name: Kathleen Vang MRN: Lelon Mast Date of Birth: [birth year]

## 2019-05-26 MED FILL — FLUOCINONIDE 0.05% OINTMENT: 0.05 | 30 days supply | Qty: 30 | Fill #1

## 2019-05-29 ENCOUNTER — Ambulatory Visit: Payer: No Typology Code available for payment source | Admitting: Physical Therapy

## 2019-06-01 ENCOUNTER — Ambulatory Visit: Payer: No Typology Code available for payment source | Admitting: Physical Therapy

## 2019-06-01 ENCOUNTER — Other Ambulatory Visit: Payer: Self-pay

## 2019-06-01 ENCOUNTER — Encounter: Payer: Self-pay | Admitting: Physical Therapy

## 2019-06-01 DIAGNOSIS — M6281 Muscle weakness (generalized): Secondary | ICD-10-CM

## 2019-06-01 DIAGNOSIS — M25572 Pain in left ankle and joints of left foot: Secondary | ICD-10-CM

## 2019-06-01 DIAGNOSIS — R262 Difficulty in walking, not elsewhere classified: Secondary | ICD-10-CM

## 2019-06-01 NOTE — Therapy (Signed)
[facility] [address] [place], [place], [address] Phone: [phone]   Fax:  [phone]  Physical Therapy Treatment  Patient Details  Name: Kathleen Vang MRN: [id number] Date of Birth: 23-Feb-1971 Referring Provider (PT): Alfredo Martinez [place]   Encounter Date: 06/01/2019  PT End of Session - 06/01/19 1559    Visit Number  2    Number of Visits  16    Date for PT Re-Evaluation  07/09/19    Authorization Type  [facility] Focus Plan    PT Start Time  1502    PT Stop Time  1545    PT Time Calculation (min)  43 min    Activity Tolerance  Patient limited by pain    Behavior During Therapy  [facility] for tasks assessed/performed       Past Medical History:  Diagnosis Date  . Anemia   . Herpes genitalis in women 2010  . MRSA infection greater than 3 months ago 2009  . Sarcoid 1993   in remission per pt    Past Surgical History:  Procedure Laterality Date  . LYMPH NODE BIOPSY  1993  . TUBAL LIGATION      There were no vitals filed for this visit.  Subjective Assessment - 06/01/19 1551    Subjective  Patient reports her pain was better for a few days after treatment but her pain is back today. She hads been on her feet. She has had pain in standing.    Pertinent History  Sarcoidosis, 5 year LT ankle/ tibialis posterior tendon dysfunction    Limitations  Walking;Standing;House hold activities    How long can you sit comfortably?  unlimited    How long can you stand comfortably?  10-15 min    How long can you walk comfortably?  15 min on tradmill with brace on    Diagnostic tests  x ray    Patient Stated Goals  Be able to stand and walk on my foot without braces or pain    Currently in Pain?  Yes    Pain Score  8     Pain Location  Foot    Pain Orientation  Left    Pain Descriptors / Indicators  Aching    Pain Type  Chronic pain    Pain Onset  More than a month ago    Pain Frequency  Constant    Aggravating Factors   standing and  walking    Pain Relieving Factors  ice and meloxicam                       [facility] Adult PT Treatment/Exercise - 06/01/19 0001      Modalities   Modalities  Ultrasound      Ultrasound   Ultrasound Location  to posterior medial maleolus on medial foot    Ultrasound Parameters  1.5 w/cm2 1 cm continuos     Ultrasound Goals  Pain      Iontophoresis   Type of Iontophoresis  Dexamethasone    Location  LT navicular    Dose  1cc    Time  4-6 hour patch      Manual Therapy   Manual Therapy  Soft tissue mobilization;Passive ROM;Joint mobilization    Joint Mobilization  talor glies to improve DF     Soft tissue mobilization  to posterior tib tendon     Passive ROM  gentle PROM       Ankle Exercises: Seated  Other Seated Ankle Exercises  seated arch raise 2x10;       Ankle Exercises: Supine   Other Supine Ankle Exercises  ankle PF 2x10; ankle inversion 2x10              PT Education - 06/01/19 1558    Education Details  HEP and symptom management    Person(s) Educated  Patient    Methods  Explanation;Demonstration;Tactile cues;Verbal cues    Comprehension  Verbalized understanding;Returned demonstration;Verbal cues required;Tactile cues required       PT Short Term Goals - 06/01/19 1611      PT SHORT TERM GOAL #1   Title  Pt will be independent with initial HEP    Time  4    Period  Weeks    Status  On-going    Target Date  06/11/19      PT SHORT TERM GOAL #2   Title  Pt will be able to stand on foot for 20  minutes to wash dishes and do minor housework    Time  4    Period  Weeks    Status  On-going    Target Date  06/11/19      PT SHORT TERM GOAL #3   Title  Pt will be able to perform 10 heel raises    Baseline  unable to perform    Period  Weeks    Status  On-going    Target Date  06/11/19      PT SHORT TERM GOAL #4   Title  Pt will be fitted for good orthotic and supportive shoe to enhance comfort in standing and walking for work     Baseline  getting fit today    Time  4    Period  Weeks    Status  On-going    Target Date  06/22/19        PT Long Term Goals - 05/14/19 1547      PT LONG TERM GOAL #1   Title  Pt will be independent with advanced HEP    Time  8    Period  Weeks    Status  New    Target Date  07/09/19      PT LONG TERM GOAL #2   Title  Pt will be able to stand for one hour with pain 3/10 or less in LT foot with good shoe/orthotic    Time  8    Period  Weeks    Status  New    Target Date  07/09/19      PT LONG TERM GOAL #3   Title  Pt will be able to ascend /descend 10 steps alternating steps without dependence on UE support    Time  8    Period  Weeks    Status  New    Target Date  07/09/19      PT LONG TERM GOAL #4   Title  FOTO will improve from 49% limitation    to 33%    indicating improved functional mobility.    Baseline  Eval limation 49% 05-14-19    Time  8    Period  Weeks    Status  New    Target Date  07/09/19      PT LONG TERM GOAL #5   Title  Pt will be to squat in order to pick up groceries from floor    Baseline  Pt in too much pain to squat without  compensation    Time  8    Period  Weeks    Status  New    Target Date  07/09/19            Plan - 06/01/19 1604    Clinical Impression Statement  Therapy added ultrasound today. She tolerated well. Therapy focused on manual therapy. She is tender to palpation along the posterior tib tendon. She had increased pain with DF. Therapy added ankle PF and inversion active movement for strengthening. Therapy will continue to progress as tolerated.    Personal Factors and Comorbidities  Comorbidity 1    Comorbidities  Sarcoidosis See medical chart    Examination-Activity Limitations  Squat;Stand;Stairs    Examination-Participation Restrictions  Cleaning    Stability/Clinical Decision Making  Evolving/Moderate complexity    Clinical Decision Making  Moderate    Rehab Potential  Good    PT Frequency  2x / week    PT  Duration  8 weeks    PT Treatment/Interventions  Cryotherapy;Electrical Stimulation;Ultrasound;Moist Heat;Iontophoresis 4mg /ml Dexamethasone;Gait training;Stair training;Functional mobility training;Therapeutic activities;Therapeutic exercise;Patient/family education;Neuromuscular re-education;Manual techniques;Passive range of motion;Taping;Dry needling;Joint Manipulations    PT Next Visit Plan  Pt to see April Wilson for orthotics  check Iontophoresis patch from last time.  modalities and manual, progress exericise    PT Home Exercise Plan  May    Consulted and Agree with Plan of Care  Patient       Patient will benefit from skilled therapeutic intervention in order to improve the following deficits and impairments:  Abnormal gait, Pain, Improper body mechanics, Postural dysfunction, Decreased strength, Decreased range of motion, Decreased activity tolerance, Decreased mobility  Visit Diagnosis: Pain in left ankle and joints of left foot  Difficulty in walking, not elsewhere classified  Muscle weakness (generalized)     Problem List Patient Active Problem List   Diagnosis Date Noted  . GOITER, UNSPECIFIED 04/28/2007  . DIZZINESS 04/28/2007  . TONSILLAR HYPERTROPHY, UNILATERAL 08/27/2006  . SARCOIDOSIS 04/03/2006  . ANEMIA NOS 04/03/2006  . TOBACCO ABUSE 04/03/2006  . HEADACHE, TENSION 04/03/2006  . DISORDER, DEPRESSIVE NEC 04/03/2006  . HIATAL HERNIA WITH REFLUX 04/03/2006  . EPICONDYLITIS, LATERAL 04/03/2006  . INSOMNIA 04/03/2006  . HELICOBACTER PYLORI GASTRITIS, HX OF 04/03/2006    04/05/2006 PT DPT  06/01/2019, 4:16 PM  [facility] [address] [place], [place], [place] Phone: [phone]   Fax:  [phone]  Name: Kathleen Vang MRN: Lelon Mast Date of Birth: 11/20/1970

## 2019-06-03 ENCOUNTER — Other Ambulatory Visit: Payer: Self-pay

## 2019-06-03 ENCOUNTER — Ambulatory Visit: Payer: No Typology Code available for payment source | Admitting: Physical Therapy

## 2019-06-03 DIAGNOSIS — R262 Difficulty in walking, not elsewhere classified: Secondary | ICD-10-CM

## 2019-06-03 DIAGNOSIS — M25572 Pain in left ankle and joints of left foot: Secondary | ICD-10-CM

## 2019-06-03 DIAGNOSIS — M6281 Muscle weakness (generalized): Secondary | ICD-10-CM

## 2019-06-03 NOTE — Therapy (Signed)
[facility] [address] [place], [place], [address] Phone: [phone]   Fax:  [phone]  Physical Therapy Treatment  Patient Details  Name: Kathleen Vang MRN: [id number] Date of Birth: 1970-03-24 Referring Provider (PT): Alfredo Martinez [place]   Encounter Date: 06/03/2019  PT End of Session - 06/03/19 1512    Visit Number  3    Number of Visits  16    Date for PT Re-Evaluation  07/09/19    Authorization Type  [facility] Focus Plan    PT Start Time  1415    PT Stop Time  1456    PT Time Calculation (min)  41 min    Activity Tolerance  Patient limited by pain    Behavior During Therapy  [facility] for tasks assessed/performed       Past Medical History:  Diagnosis Date  . Anemia   . Herpes genitalis in women 2010  . MRSA infection greater than 3 months ago 2009  . Sarcoid 1993   in remission per pt    Past Surgical History:  Procedure Laterality Date  . LYMPH NODE BIOPSY  1993  . TUBAL LIGATION      There were no vitals filed for this visit.  Subjective Assessment - 06/03/19 1513    Subjective  Patient has her shoes and inserts. yesterday she was in a lot of pain but today she is much better. She just got off work.    Pertinent History  Sarcoidosis, 5 year LT ankle/ tibialis posterior tendon dysfunction    Limitations  Walking;Standing;House hold activities    How long can you stand comfortably?  10-15 min    How long can you walk comfortably?  15 min on tradmill with brace on    Diagnostic tests  x ray    Patient Stated Goals  Be able to stand and walk on my foot without braces or pain    Currently in Pain?  Yes    Pain Score  6     Pain Location  Foot    Pain Orientation  Left    Pain Descriptors / Indicators  Aching    Pain Type  Chronic pain    Pain Onset  More than a month ago    Pain Frequency  Constant    Aggravating Factors   standing and walking    Pain Relieving Factors  ice with meloxicam                        [facility] Adult PT Treatment/Exercise - 06/03/19 1433      Modalities   Modalities  Ultrasound      Ultrasound   Ultrasound Location  to medial foot and ankle     Ultrasound Parameters  1.5w/cm2 pulsed 1 mhz     Ultrasound Goals  Pain      Iontophoresis   Type of Iontophoresis  Dexamethasone    Location  LT navicular    Dose  1cc    Time  4-6 hour patch      Manual Therapy   Manual Therapy  Soft tissue mobilization;Passive ROM;Joint mobilization    Joint Mobilization  talor glies to improve DF     Soft tissue mobilization  to posterior tib tendon     Passive ROM  gentle PROM       Ankle Exercises: Seated   Other Seated Ankle Exercises  seated arch raise 2x10;       Ankle  Exercises: Supine   Other Supine Ankle Exercises  ankle PF 2x10; ankle inversion 2x10              PT Education - 06/03/19 1501    Education Details  reviewed HEP, symptom mangement, wear time with the    Person(s) Educated  Patient    Methods  Explanation;Demonstration;Tactile cues;Verbal cues    Comprehension  Verbalized understanding;Returned demonstration;Verbal cues required;Tactile cues required       PT Short Term Goals - 06/03/19 1530      PT SHORT TERM GOAL #1   Title  Pt will be independent with initial HEP    Time  4    Period  Weeks    Status  On-going    Target Date  06/11/19      PT SHORT TERM GOAL #2   Title  Pt will be able to stand on foot for 20  minutes to wash dishes and do minor housework    Time  4    Period  Weeks    Status  On-going    Target Date  06/11/19      PT SHORT TERM GOAL #3   Title  Pt will be able to perform 10 heel raises    Baseline  unable to perform    Period  Weeks    Status  On-going    Target Date  06/11/19      PT SHORT TERM GOAL #4   Title  Pt will be fitted for good orthotic and supportive shoe to enhance comfort in standing and walking for work    Baseline  fit and working on wearing his orthotics     Time  4    Period  Weeks    Status  On-going    Target Date  06/22/19        PT Long Term Goals - 05/14/19 1547      PT LONG TERM GOAL #1   Title  Pt will be independent with advanced HEP    Time  8    Period  Weeks    Status  New    Target Date  07/09/19      PT LONG TERM GOAL #2   Title  Pt will be able to stand for one hour with pain 3/10 or less in LT foot with good shoe/orthotic    Time  8    Period  Weeks    Status  New    Target Date  07/09/19      PT LONG TERM GOAL #3   Title  Pt will be able to ascend /descend 10 steps alternating steps without dependence on UE support    Time  8    Period  Weeks    Status  New    Target Date  07/09/19      PT LONG TERM GOAL #4   Title  FOTO will improve from 49% limitation    to 33%    indicating improved functional mobility.    Baseline  Eval limation 49% 05-14-19    Time  8    Period  Weeks    Status  New    Target Date  07/09/19      PT LONG TERM GOAL #5   Title  Pt will be to squat in order to pick up groceries from floor    Baseline  Pt in too much pain to squat without compensation    Time  8  Period  Weeks    Status  New    Target Date  07/09/19            Plan - 06/03/19 1519    Clinical Impression Statement  Therapy adeed standing weight shfit forward. She reported some pulling but was able to tolerate. Her DF was better today per visual ispection and active ankle movements seem to have caused less pain. She may beneit from needling of the posterior tib.    Personal Factors and Comorbidities  Comorbidity 1    Comorbidities  Sarcoidosis See medical chart    Examination-Activity Limitations  Squat;Stand;Stairs    Stability/Clinical Decision Making  Evolving/Moderate complexity    Clinical Decision Making  Moderate    Rehab Potential  Good    PT Duration  8 weeks    PT Treatment/Interventions  Cryotherapy;Electrical Stimulation;Ultrasound;Moist Heat;Iontophoresis 4mg /ml Dexamethasone;Gait training;Stair  training;Functional mobility training;Therapeutic activities;Therapeutic exercise;Patient/family education;Neuromuscular re-education;Manual techniques;Passive range of motion;Taping;Dry needling;Joint Manipulations    PT Next Visit Plan  Pt to see April Wilson for orthotics  check Iontophoresis patch from last time.  modalities and manual, progress exericise    Consulted and Agree with Plan of Care  Patient       Patient will benefit from skilled therapeutic intervention in order to improve the following deficits and impairments:  Abnormal gait, Pain, Improper body mechanics, Postural dysfunction, Decreased strength, Decreased range of motion, Decreased activity tolerance, Decreased mobility  Visit Diagnosis: Pain in left ankle and joints of left foot  Difficulty in walking, not elsewhere classified  Muscle weakness (generalized)     Problem List Patient Active Problem List   Diagnosis Date Noted  . GOITER, UNSPECIFIED 04/28/2007  . DIZZINESS 04/28/2007  . TONSILLAR HYPERTROPHY, UNILATERAL 08/27/2006  . SARCOIDOSIS 04/03/2006  . ANEMIA NOS 04/03/2006  . TOBACCO ABUSE 04/03/2006  . HEADACHE, TENSION 04/03/2006  . DISORDER, DEPRESSIVE NEC 04/03/2006  . HIATAL HERNIA WITH REFLUX 04/03/2006  . EPICONDYLITIS, LATERAL 04/03/2006  . INSOMNIA 04/03/2006  . HELICOBACTER PYLORI GASTRITIS, HX OF 04/03/2006    04/05/2006 PT DPT  06/03/2019, 3:32 PM  [facility] [address] [place], [place], [place] Phone: [phone]   Fax:  [phone]  Name: Kathleen Vang MRN: Lelon Mast Date of Birth: Sep 29, 1970

## 2019-06-08 ENCOUNTER — Other Ambulatory Visit: Payer: Self-pay

## 2019-06-08 ENCOUNTER — Encounter: Payer: Self-pay | Admitting: Physical Therapy

## 2019-06-08 ENCOUNTER — Ambulatory Visit: Payer: No Typology Code available for payment source | Attending: Student | Admitting: Physical Therapy

## 2019-06-08 DIAGNOSIS — M6281 Muscle weakness (generalized): Secondary | ICD-10-CM | POA: Diagnosis present

## 2019-06-08 DIAGNOSIS — M25572 Pain in left ankle and joints of left foot: Secondary | ICD-10-CM | POA: Insufficient documentation

## 2019-06-08 DIAGNOSIS — R262 Difficulty in walking, not elsewhere classified: Secondary | ICD-10-CM | POA: Insufficient documentation

## 2019-06-08 NOTE — Therapy (Addendum)
[place], [place], [address] Phone: [phone]   Fax:  [phone]  Physical Therapy Treatment/Discharge   Patient Details  Name: Kathleen Vang MRN: [id number] Date of Birth: [birth year] Referring Provider (PT): Mechele Claude [place]   Encounter Date: 06/08/2019  PT End of Session - 06/08/19 1457    Visit Number  4    Number of Visits  16    Date for PT Re-Evaluation  07/09/19    Authorization Type  [facility] Focus Plan    PT Start Time  0215    PT Stop Time  7342   limited treatment 2nd to elevated levels of pain and MD appointment   PT Time Calculation (min)  30 min    Activity Tolerance  Patient limited by pain    Behavior During Therapy  [facility] for tasks assessed/performed       Past Medical History:  Diagnosis Date  . Anemia   . Herpes genitalis in women 2010  . MRSA infection greater than 3 months ago 2009  . Sarcoid 1993   in remission per pt    Past Surgical History:  Procedure Laterality Date  . LYMPH NODE BIOPSY  1993  . TUBAL LIGATION      There were no vitals filed for this visit.  Subjective Assessment - 06/08/19 1455    Subjective  Patient continues to report significantly high levels of pain. Over the weekend her pain had improved some but today after being back at work it is back up to around an 8/10.    Limitations  Walking;Standing;House hold activities    How long can you sit comfortably?  unlimited    How long can you stand comfortably?  10-15 min    How long can you walk comfortably?  15 min on tradmill with brace on    Diagnostic tests  x ray    Patient Stated Goals  Be able to stand and walk on my foot without braces or pain    Currently in Pain?  Yes    Pain Score  8     Pain Location  Ankle    Pain Orientation  Left    Pain Descriptors / Indicators  Aching;Moaning;Grimacing    Pain Type  Chronic pain    Pain Onset  More than a month ago    Pain Frequency  Constant     Aggravating Factors   standing and walking    Pain Relieving Factors  ice, meloxicam                       [facility] Adult PT Treatment/Exercise - 06/08/19 0001      Ultrasound   Ultrasound Location  to medial foot     Ultrasound Parameters  1.5w/cm2 cont 3.3 mhz     Ultrasound Goals  Pain      Iontophoresis   Type of Iontophoresis  --   held 2nd to MD appointment      Manual Therapy   Manual Therapy  Soft tissue mobilization;Passive ROM;Joint mobilization    Joint Mobilization  talor gldies to improve DF gentle TFM     Soft tissue mobilization  to posterior tib tendon     Passive ROM  gentle PROM              PT Education - 06/08/19 1456    Education Details  reviewed use of orthotic and wear schedule    Person(s) Educated  Patient    Methods  Explanation    Comprehension  Verbalized understanding       PT Short Term Goals - 06/08/19 1508      PT SHORT TERM GOAL #1   Title  Pt will be independent with initial HEP    Time  4    Period  Weeks    Status  On-going    Target Date  06/11/19      PT SHORT TERM GOAL #2   Title  Pt will be able to stand on foot for 20  minutes to wash dishes and do minor housework    Time  4    Period  Weeks    Status  On-going    Target Date  06/11/19      PT SHORT TERM GOAL #3   Title  Pt will be able to perform 10 heel raises    Baseline  unable to perform    Period  Weeks    Status  On-going    Target Date  06/11/19      PT SHORT TERM GOAL #4   Title  Pt will be fitted for good orthotic and supportive shoe to enhance comfort in standing and walking for work    Baseline  fit and working on wearing his orthotics    Time  4    Period  Weeks    Status  On-going        PT Long Term Goals - 05/14/19 1547      PT LONG TERM GOAL #1   Title  Pt will be independent with advanced HEP    Time  8    Period  Weeks    Status  New    Target Date  07/09/19      PT LONG TERM GOAL #2   Title  Pt will be able to  stand for one hour with pain 3/10 or less in LT foot with good shoe/orthotic    Time  8    Period  Weeks    Status  New    Target Date  07/09/19      PT LONG TERM GOAL #3   Title  Pt will be able to ascend /descend 10 steps alternating steps without dependence on UE support    Time  8    Period  Weeks    Status  New    Target Date  07/09/19      PT LONG TERM GOAL #4   Title  FOTO will improve from 49% limitation    to 33%    indicating improved functional mobility.    Baseline  Eval limation 49% 05-14-19    Time  8    Period  Weeks    Status  New    Target Date  07/09/19      PT LONG TERM GOAL #5   Title  Pt will be to squat in order to pick up groceries from floor    Baseline  Pt in too much pain to squat without compensation    Time  8    Period  Weeks    Status  New    Target Date  07/09/19            Plan - 06/08/19 1459    Clinical Impression Statement  Therapy to this point has focused on getting patient into a supportive shoe with an orthotic, light strengthening, and anti-inlfammatory modalities. Although she has had some improvement  in motion and some days that are better he pain levels are still significant. patient exeressed intrest in trigger point dry needling today but it was held 2nd to non-verbal signs of high pain levels such as moaning and grimicing before any treatment was performed. Patient also had " shooting electric pain" with ultrasound. Patient will return to MD today. Therapy will proceed per MD reccoemndations.    Personal Factors and Comorbidities  Comorbidity 1    Comorbidities  Sarcoidosis See medical chart    Examination-Participation Restrictions  Cleaning    Stability/Clinical Decision Making  Evolving/Moderate complexity    Clinical Decision Making  Moderate    Rehab Potential  Good    PT Frequency  2x / week    PT Duration  8 weeks    PT Treatment/Interventions  Cryotherapy;Electrical Stimulation;Ultrasound;Moist Heat;Iontophoresis  30m/ml Dexamethasone;Gait training;Stair training;Functional mobility training;Therapeutic activities;Therapeutic exercise;Patient/family education;Neuromuscular re-education;Manual techniques;Passive range of motion;Taping;Dry needling;Joint Manipulations    PT Next Visit Plan  Pt to see April Wilson for orthotics  check Iontophoresis patch from last time.  modalities and manual, progress exericise    PT Home Exercise Plan  [id number]   Consulted and Agree with Plan of Care  Patient       Patient will benefit from skilled therapeutic intervention in order to improve the following deficits and impairments:  Abnormal gait, Pain, Improper body mechanics, Postural dysfunction, Decreased strength, Decreased range of motion, Decreased activity tolerance, Decreased mobility  Visit Diagnosis: Pain in left ankle and joints of left foot  Difficulty in walking, not elsewhere classified  Muscle weakness (generalized)     Problem List Patient Active Problem List   Diagnosis Date Noted  . GOITER, UNSPECIFIED 04/28/2007  . DIZZINESS 04/28/2007  . TONSILLAR HYPERTROPHY, UNILATERAL 08/27/2006  . SARCOIDOSIS 04/03/2006  . ANEMIA NOS 04/03/2006  . TOBACCO ABUSE 04/03/2006  . HEADACHE, TENSION 04/03/2006  . DISORDER, DEPRESSIVE NEC 04/03/2006  . HIATAL HERNIA WITH REFLUX 04/03/2006  . EPICONDYLITIS, LATERAL 04/03/2006  . INSOMNIA 04/03/2006  . HELICOBACTER PYLORI GASTRITIS, HX OF 04/03/2006   PHYSICAL THERAPY DISCHARGE SUMMARY  Visits from Start of Care: 4  Current functional level related to goals / functional outcomes: Continued high levels of pain. Patient sent back to the MD    Remaining deficits: Significant pain    Education / Equipment: HEP   Plan: Patient agrees to discharge.  Patient goals were not met. Patient is being discharged due to a change in medical status.  ?????      DCarney Living PT DPT 06/08/2019, 3:10 PM  [facility] [address]Phone: [phone]  Fax:  [phone] Name: Kathleen ShandsMRN: [id number] of Birth: [birth year]

## 2019-06-10 ENCOUNTER — Ambulatory Visit: Payer: No Typology Code available for payment source | Admitting: Physical Therapy

## 2019-06-15 ENCOUNTER — Encounter: Payer: Self-pay | Admitting: Physical Therapy

## 2019-06-17 ENCOUNTER — Ambulatory Visit: Payer: No Typology Code available for payment source | Admitting: Physical Therapy

## 2019-06-22 ENCOUNTER — Ambulatory Visit: Payer: No Typology Code available for payment source | Admitting: Physical Therapy

## 2019-06-29 ENCOUNTER — Other Ambulatory Visit (HOSPITAL_COMMUNITY): Payer: Self-pay | Admitting: Orthopedic Surgery

## 2019-07-01 MED FILL — VALACYCLOVIR HCL 500 MG TAB: 500 | 90 days supply | Qty: 90 | Fill #2

## 2019-07-06 ENCOUNTER — Other Ambulatory Visit: Payer: Self-pay

## 2019-07-07 ENCOUNTER — Encounter: Payer: Self-pay | Admitting: Family Medicine

## 2019-07-07 ENCOUNTER — Ambulatory Visit (INDEPENDENT_AMBULATORY_CARE_PROVIDER_SITE_OTHER): Payer: No Typology Code available for payment source | Admitting: Family Medicine

## 2019-07-07 VITALS — BP 118/76 | HR 76 | Temp 97.6°F | Resp 12 | Ht 62.0 in | Wt 169.4 lb

## 2019-07-07 DIAGNOSIS — E669 Obesity, unspecified: Secondary | ICD-10-CM | POA: Diagnosis not present

## 2019-07-07 DIAGNOSIS — R232 Flushing: Secondary | ICD-10-CM

## 2019-07-07 DIAGNOSIS — Z6830 Body mass index (BMI) 30.0-30.9, adult: Secondary | ICD-10-CM

## 2019-07-07 DIAGNOSIS — F17200 Nicotine dependence, unspecified, uncomplicated: Secondary | ICD-10-CM

## 2019-07-07 DIAGNOSIS — I10 Essential (primary) hypertension: Secondary | ICD-10-CM

## 2019-07-07 MED ORDER — VENLAFAXINE HCL ER 37.5 MG PO CP24: 37.5000 mg | ORAL_CAPSULE | Freq: Every day | ORAL | 1 refills | Status: DC

## 2019-07-07 MED FILL — VENLAFAXINE HCL ER 37.5 MG: 37.5 | 30 days supply | Qty: 30 | Fill #0

## 2019-07-07 NOTE — Progress Notes (Addendum)
HPI:  Kathleen Vang is a 49 y.o. female, who is here today to establish care.  Former PCP: Dr Duanne Guess. Last preventive routine visit: 10/02/18.  Chronic medical problems: Tobacco use disorder, HTN,sarcoidosis (30+ years remission),and recurrent labial herpes (managed by her gyn). Negative for history of DM, hypertension, hyperlipidemia, or CAD. She has drinks a couple beers daily.  She does not exercise regularly but she walks a lot at work.   She was walking the treadmill but stopped due to foot pain.  She is following with podiatrist, planning on having surgery later this month.  In general she thinks she follows a healthful diet. She is smokes about 4 cigarettes daily, she is trying to quit, using nicotine vapor.  HTN: She is on nonpharmacologic treatment. Negative for severe/frequent headache, visual changes, chest pain, dyspnea, palpitation,focal weakness, or edema.  Concerns today: Hot flashes She has had problem for about a year, day and night It does interfere with his sleep. She has not identified exacerbating or alleviating factors. She has not tried OTC medication. LMP about 3 weeks ago.  Review of Systems  Constitutional: Negative for activity change, appetite change and fever.  HENT: Negative for mouth sores, nosebleeds and sore throat.   Eyes: Negative for redness and visual disturbance.  Respiratory: Negative for cough, shortness of breath and wheezing.   Cardiovascular: Negative for chest pain, palpitations and leg swelling.  Gastrointestinal: Negative for abdominal pain, nausea and vomiting.       Negative for changes in bowel habits.  Endocrine: Negative for cold intolerance.  Genitourinary: Negative for decreased urine volume and hematuria.  Neurological: Negative for syncope, weakness and headaches.  Psychiatric/Behavioral: Positive for sleep disturbance. Negative for confusion.  Rest see pertinent positives and negatives per HPI.   Current  Outpatient Medications on File Prior to Visit  Medication Sig Dispense Refill  . Acetaminophen-Codeine 300-30 MG tablet acetaminophen 300 mg-codeine 30 mg tablet    . diphenhydrAMINE HCl, Sleep, (UNISOM SLEEPGELS PO) Take by mouth.    . fluocinonide ointment (LIDEX) 0.05 % fluocinonide 0.05 % topical ointment  APPLY ON THE SKIN DAILY AS NEEDED    . Multiple Vitamins-Minerals (MULTIVITAMIN WITH MINERALS) tablet Take by mouth.    Marland Kitchen tiZANidine (ZANAFLEX) 4 MG tablet tizanidine 4 mg tablet    . valACYclovir (VALTREX) 500 MG tablet valacyclovir 500 mg tablet  TAKE 1 TABLET BY MOUTH ONCE DAILY     No current facility-administered medications on file prior to visit.   Past Medical History:  Diagnosis Date  . Anemia   . Arthritis   . Herpes genitalis in women 2010  . MRSA infection greater than 3 months ago 2009  . Sarcoid 1993   in remission per pt   Allergies  Allergen Reactions  . Latex Hives and Itching  . Amoxicillin Hives  . Codeine Nausea And Vomiting  . Penicillins Hives    REACTION: hives    Family History  Problem Relation Age of Onset  . Other Mother   . Colon cancer Neg Hx   . Colon polyps Neg Hx   . Esophageal cancer Neg Hx   . Rectal cancer Neg Hx   . Stomach cancer Neg Hx     Social History   Socioeconomic History  . Marital status: Single    Spouse name: Not on file  . Number of children: 2  . Years of education: Not on file  . Highest education level: GED or equivalent  Occupational History  .  Not on file  Tobacco Use  . Smoking status: Former Smoker    Packs/day: 0.25    Types: Cigarettes    Quit date: 08/04/2018    Years since quitting: 0.9  . Smokeless tobacco: Never Used  Substance and Sexual Activity  . Alcohol use: Yes    Alcohol/week: 6.0 standard drinks    Types: 6 Cans of beer per week  . Drug use: No  . Sexual activity: Yes    Birth control/protection: None, Surgical  Other Topics Concern  . Not on file  Social History Narrative  .  Not on file   Social Determinants of Health   Financial Resource Strain:   . Difficulty of Paying Living Expenses:   Food Insecurity:   . Worried About Charity fundraiser in the Last Year:   . Arboriculturist in the Last Year:   Transportation Needs:   . Film/video editor (Medical):   Marland Kitchen Lack of Transportation (Non-Medical):   Physical Activity:   . Days of Exercise per Week:   . Minutes of Exercise per Session:   Stress:   . Feeling of Stress :   Social Connections:   . Frequency of Communication with Friends and Family:   . Frequency of Social Gatherings with Friends and Family:   . Attends Religious Services:   . Active Member of Clubs or Organizations:   . Attends Archivist Meetings:   Marland Kitchen Marital Status:     Vitals:   07/07/19 0727  BP: 118/76  Pulse: 76  Resp: 12  Temp: 97.6 F (36.4 C)  SpO2: 98%   Body mass index is 30.98 kg/m.  Physical Exam  Nursing note and vitals reviewed. Constitutional: She is oriented to person, place, and time. She appears well-developed. No distress.  HENT:  Head: Normocephalic and atraumatic.  Mouth/Throat: Oropharynx is clear and moist and mucous membranes are normal.  Eyes: Pupils are equal, round, and reactive to light. Conjunctivae are normal.  Cardiovascular: Normal rate and regular rhythm.  Murmur (Soft SEM RUSB) heard. Pulses:      Dorsalis pedis pulses are 2+ on the right side and 2+ on the left side.  Respiratory: Effort normal and breath sounds normal. No respiratory distress.  GI: Soft. She exhibits no mass. There is no hepatomegaly. There is no abdominal tenderness.  Musculoskeletal:        General: No edema.  Lymphadenopathy:    She has no cervical adenopathy.  Neurological: She is alert and oriented to person, place, and time. She has normal strength. No cranial nerve deficit. Gait normal.  Skin: Skin is warm. No rash noted. No erythema.  Psychiatric: She has a normal mood and affect.  Well  groomed, good eye contact.   ASSESSMENT AND PLAN:  Kathleen Vang was seen today for establish care.  Diagnoses and all orders for this visit:  Hot flashes We discussed pharmacologic treatment options. She agrees with trying Effexor XR. Instructed to let me know if Effexor is helping with symptoms. Also recommend trying to identified foods that may aggravate problem.  Lab Results  Component Value Date   TSH 0.958 10/02/2018   -     venlafaxine XR (EFFEXOR XR) 37.5 MG 24 hr capsule; Take 1 capsule (37.5 mg total) by mouth daily with breakfast.  TOBACCO ABUSE Encouraged to continue working on smoking cessation.  Class 1 obesity without serious comorbidity with body mass index (BMI) of 30.0 to 30.9 in adult, unspecified obesity type  Consistency with regular physical activity and a healthy diet,10,000 steps daily. If she decreases his beer intake, this may help with weight loss.  Hypertension, essential, benign Per records reviewed. Today BP adequately controlled. Continue nonpharmacologic treatment.  Return in about 3 months (around 09/30/2019) for cpe.   Arvle Grabe G. [place], MD  [facility]. [place] office.     A few things to remember from today's visit:  Try to decrease beer intake. 10,000 steps daily is equivalent to exercise.  Perimenopause  Perimenopause is the normal time of life before and after menstrual periods stop completely (menopause). Perimenopause can begin 2-8 years before menopause, and it usually lasts for 1 year after menopause. During perimenopause, the ovaries may or may not produce an egg. What are the causes? This condition is caused by a natural change in hormone levels that happens as you get older. What increases the risk? This condition is more likely to start at an earlier age if you have certain medical conditions or treatments, including:  A tumor of the pituitary gland in the brain.  A disease that affects the ovaries and hormone  production.  Radiation treatment for cancer.  Certain cancer treatments, such as chemotherapy or hormone (anti-estrogen) therapy.  Heavy smoking and excessive alcohol use.  Family history of early menopause. What are the signs or symptoms? Perimenopausal changes affect each woman differently. Symptoms of this condition may include:  Hot flashes.  Night sweats.  Irregular menstrual periods.  Decreased sex drive.  Vaginal dryness.  Headaches.  Mood swings.  Depression.  Memory problems or trouble concentrating.  Irritability.  Tiredness.  Weight gain.  Anxiety.  Trouble getting pregnant. How is this diagnosed? This condition is diagnosed based on your medical history, a physical exam, your age, your menstrual history, and your symptoms. Hormone tests may also be done. How is this treated? In some cases, no treatment is needed. You and your health care provider should make a decision together about whether treatment is necessary. Treatment will be based on your individual condition and preferences. Various treatments are available, such as:  Menopausal hormone therapy (MHT).  Medicines to treat specific symptoms.  Acupuncture.  Vitamin or herbal supplements. Before starting treatment, make sure to let your health care provider know if you have a personal or family history of:  Heart disease.  Breast cancer.  Blood clots.  Diabetes.  Osteoporosis. Follow these instructions at home: Lifestyle  Do not use any products that contain nicotine or tobacco, such as cigarettes and e-cigarettes. If you need help quitting, ask your health care provider.  Eat a balanced diet that includes fresh fruits and vegetables, whole grains, soybeans, eggs, lean meat, and low-fat dairy.  Get at least 30 minutes of physical activity on 5 or more days each week.  Avoid alcoholic and caffeinated beverages, as well as spicy foods. This may help prevent hot flashes.  Get 7-8  hours of sleep each night.  Dress in layers that can be removed to help you manage hot flashes.  Find ways to manage stress, such as deep breathing, meditation, or journaling. General instructions  Keep track of your menstrual periods, including: ? When they occur. ? How heavy they are and how long they last. ? How much time passes between periods.  Keep track of your symptoms, noting when they start, how often you have them, and how long they last.  Take over-the-counter and prescription medicines only as told by your health care provider.  Take vitamin supplements  only as told by your health care provider. These may include calcium, vitamin E, and vitamin D.  Use vaginal lubricants or moisturizers to help with vaginal dryness and improve comfort during sex.  Talk with your health care provider before starting any herbal supplements.  Keep all follow-up visits as told by your health care provider. This is important. This includes any group therapy or counseling. Contact a health care provider if:  You have heavy vaginal bleeding or pass blood clots.  Your period lasts more than 2 days longer than normal.  Your periods are recurring sooner than 21 days.  You bleed after having sex. Get help right away if:  You have chest pain, trouble breathing, or trouble talking.  You have severe depression.  You have pain when you urinate.  You have severe headaches.  You have vision problems. Summary  Perimenopause is the time when a woman's body begins to move into menopause. This may happen naturally or as a result of other health problems or medical treatments.  Perimenopause can begin 2-8 years before menopause, and it usually lasts for 1 year after menopause.  Perimenopausal symptoms can be managed through medicines, lifestyle changes, and complementary therapies such as acupuncture. This information is not intended to replace advice given to you by your health care  provider. Make sure you discuss any questions you have with your health care provider. Document Revised: 02/01/2017 Document Reviewed: 03/27/2016 Elsevier Patient Education  [employer].  If you need refills please call your pharmacy. Do not use My Chart to request refills or for acute issues that need immediate attention.    Please be sure medication list is accurate. If a new problem present, please set up appointment sooner than planned today.

## 2019-07-07 NOTE — Patient Instructions (Addendum)
A few things to remember from today's visit:  Try to decrease beer intake. 10,000 steps daily is equivalent to exercise.  Perimenopause  Perimenopause is the normal time of life before and after menstrual periods stop completely (menopause). Perimenopause can begin 2-8 years before menopause, and it usually lasts for 1 year after menopause. During perimenopause, the ovaries may or may not produce an egg. What are the causes? This condition is caused by a natural change in hormone levels that happens as you get older. What increases the risk? This condition is more likely to start at an earlier age if you have certain medical conditions or treatments, including:  A tumor of the pituitary gland in the brain.  A disease that affects the ovaries and hormone production.  Radiation treatment for cancer.  Certain cancer treatments, such as chemotherapy or hormone (anti-estrogen) therapy.  Heavy smoking and excessive alcohol use.  Family history of early menopause. What are the signs or symptoms? Perimenopausal changes affect each woman differently. Symptoms of this condition may include:  Hot flashes.  Night sweats.  Irregular menstrual periods.  Decreased sex drive.  Vaginal dryness.  Headaches.  Mood swings.  Depression.  Memory problems or trouble concentrating.  Irritability.  Tiredness.  Weight gain.  Anxiety.  Trouble getting pregnant. How is this diagnosed? This condition is diagnosed based on your medical history, a physical exam, your age, your menstrual history, and your symptoms. Hormone tests may also be done. How is this treated? In some cases, no treatment is needed. You and your health care provider should make a decision together about whether treatment is necessary. Treatment will be based on your individual condition and preferences. Various treatments are available, such as:  Menopausal hormone therapy (MHT).  Medicines to treat specific  symptoms.  Acupuncture.  Vitamin or herbal supplements. Before starting treatment, make sure to let your health care provider know if you have a personal or family history of:  Heart disease.  Breast cancer.  Blood clots.  Diabetes.  Osteoporosis. Follow these instructions at home: Lifestyle  Do not use any products that contain nicotine or tobacco, such as cigarettes and e-cigarettes. If you need help quitting, ask your health care provider.  Eat a balanced diet that includes fresh fruits and vegetables, whole grains, soybeans, eggs, lean meat, and low-fat dairy.  Get at least 30 minutes of physical activity on 5 or more days each week.  Avoid alcoholic and caffeinated beverages, as well as spicy foods. This may help prevent hot flashes.  Get 7-8 hours of sleep each night.  Dress in layers that can be removed to help you manage hot flashes.  Find ways to manage stress, such as deep breathing, meditation, or journaling. General instructions  Keep track of your menstrual periods, including: ? When they occur. ? How heavy they are and how long they last. ? How much time passes between periods.  Keep track of your symptoms, noting when they start, how often you have them, and how long they last.  Take over-the-counter and prescription medicines only as told by your health care provider.  Take vitamin supplements only as told by your health care provider. These may include calcium, vitamin E, and vitamin D.  Use vaginal lubricants or moisturizers to help with vaginal dryness and improve comfort during sex.  Talk with your health care provider before starting any herbal supplements.  Keep all follow-up visits as told by your health care provider. This is important. This includes any group  therapy or counseling. Contact a health care provider if:  You have heavy vaginal bleeding or pass blood clots.  Your period lasts more than 2 days longer than normal.  Your  periods are recurring sooner than 21 days.  You bleed after having sex. Get help right away if:  You have chest pain, trouble breathing, or trouble talking.  You have severe depression.  You have pain when you urinate.  You have severe headaches.  You have vision problems. Summary  Perimenopause is the time when a woman's body begins to move into menopause. This may happen naturally or as a result of other health problems or medical treatments.  Perimenopause can begin 2-8 years before menopause, and it usually lasts for 1 year after menopause.  Perimenopausal symptoms can be managed through medicines, lifestyle changes, and complementary therapies such as acupuncture. This information is not intended to replace advice given to you by your health care provider. Make sure you discuss any questions you have with your health care provider. Document Revised: 02/01/2017 Document Reviewed: 03/27/2016 Elsevier Patient Education  [employer].  If you need refills please call your pharmacy. Do not use My Chart to request refills or for acute issues that need immediate attention.    Please be sure medication list is accurate. If a new problem present, please set up appointment sooner than planned today.

## 2019-07-22 ENCOUNTER — Other Ambulatory Visit: Payer: Self-pay

## 2019-07-22 ENCOUNTER — Encounter (HOSPITAL_BASED_OUTPATIENT_CLINIC_OR_DEPARTMENT_OTHER): Payer: Self-pay | Admitting: Orthopedic Surgery

## 2019-07-27 ENCOUNTER — Other Ambulatory Visit (HOSPITAL_COMMUNITY): Payer: No Typology Code available for payment source

## 2019-07-29 ENCOUNTER — Other Ambulatory Visit (HOSPITAL_COMMUNITY)
Admission: RE | Admit: 2019-07-29 | Discharge: 2019-07-29 | Disposition: A | Discharge status: Home / Self Care | Payer: No Typology Code available for payment source | Source: Ambulatory Visit | Attending: Orthopedic Surgery | Admitting: Orthopedic Surgery

## 2019-07-29 DIAGNOSIS — Z01812 Encounter for preprocedural laboratory examination: Secondary | ICD-10-CM | POA: Insufficient documentation

## 2019-07-29 DIAGNOSIS — Z20822 Contact with and (suspected) exposure to covid-19: Secondary | ICD-10-CM | POA: Insufficient documentation

## 2019-07-29 LAB — SARS CORONAVIRUS 2 (TAT 6-24 HRS): SARS Coronavirus 2: NEGATIVE

## 2019-07-29 NOTE — Progress Notes (Signed)
Spoke with patient. States she will be going for covid test at 2:30 PM today. Patient informed of risk that surgery will need rescheduled if covid test result is not back in time for surgery. Patient verbalized understanding.

## 2019-07-30 ENCOUNTER — Encounter (HOSPITAL_BASED_OUTPATIENT_CLINIC_OR_DEPARTMENT_OTHER)
Admission: RE | Disposition: A | Discharge status: Home / Self Care | Payer: Self-pay | Source: Home / Self Care | Attending: Orthopedic Surgery

## 2019-07-30 ENCOUNTER — Ambulatory Visit (HOSPITAL_BASED_OUTPATIENT_CLINIC_OR_DEPARTMENT_OTHER)
Admission: RE | Admit: 2019-07-30 | Discharge: 2019-07-30 | Disposition: A | Discharge status: Home / Self Care | Payer: No Typology Code available for payment source | Attending: Orthopedic Surgery | Admitting: Orthopedic Surgery

## 2019-07-30 ENCOUNTER — Encounter (HOSPITAL_BASED_OUTPATIENT_CLINIC_OR_DEPARTMENT_OTHER): Payer: Self-pay | Admitting: Orthopedic Surgery

## 2019-07-30 ENCOUNTER — Other Ambulatory Visit: Payer: Self-pay

## 2019-07-30 ENCOUNTER — Ambulatory Visit (HOSPITAL_BASED_OUTPATIENT_CLINIC_OR_DEPARTMENT_OTHER): Payer: No Typology Code available for payment source | Admitting: Certified Registered Nurse Anesthetist

## 2019-07-30 DIAGNOSIS — M67962 Unspecified disorder of synovium and tendon, left lower leg: Secondary | ICD-10-CM | POA: Insufficient documentation

## 2019-07-30 DIAGNOSIS — Z880 Allergy status to penicillin: Secondary | ICD-10-CM | POA: Insufficient documentation

## 2019-07-30 DIAGNOSIS — Z888 Allergy status to other drugs, medicaments and biological substances status: Secondary | ICD-10-CM | POA: Diagnosis not present

## 2019-07-30 DIAGNOSIS — M6702 Short Achilles tendon (acquired), left ankle: Secondary | ICD-10-CM | POA: Diagnosis not present

## 2019-07-30 DIAGNOSIS — F329 Major depressive disorder, single episode, unspecified: Secondary | ICD-10-CM | POA: Diagnosis not present

## 2019-07-30 DIAGNOSIS — Z87891 Personal history of nicotine dependence: Secondary | ICD-10-CM | POA: Insufficient documentation

## 2019-07-30 DIAGNOSIS — Z885 Allergy status to narcotic agent status: Secondary | ICD-10-CM | POA: Diagnosis not present

## 2019-07-30 DIAGNOSIS — Z79899 Other long term (current) drug therapy: Secondary | ICD-10-CM | POA: Insufficient documentation

## 2019-07-30 DIAGNOSIS — Q6689 Other  specified congenital deformities of feet: Secondary | ICD-10-CM | POA: Insufficient documentation

## 2019-07-30 HISTORY — DX: Other complications of anesthesia, initial encounter: T88.59XA

## 2019-07-30 HISTORY — PX: CALCANEAL OSTEOTOMY: SHX1281

## 2019-07-30 HISTORY — DX: Other specified postprocedural states: Z98.890

## 2019-07-30 HISTORY — PX: GASTROC RECESSION EXTREMITY: SHX6262

## 2019-07-30 HISTORY — DX: Other specified postprocedural states: R11.2

## 2019-07-30 HISTORY — PX: POSTERIOR TIBIAL TENDON REPAIR: SHX6039

## 2019-07-30 SURGERY — RECESSION, TENDON, GASTROCNEMIUS
Anesthesia: General | Site: Foot | Laterality: Left

## 2019-07-30 MED ORDER — HYDROMORPHONE HCL 1 MG/ML IJ SOLN
INTRAMUSCULAR | Status: AC
  Filled 2019-07-30: qty 0.5

## 2019-07-30 MED ORDER — MIDAZOLAM HCL 2 MG/2ML IJ SOLN
INTRAMUSCULAR | Status: AC
  Filled 2019-07-30: qty 2

## 2019-07-30 MED ORDER — HYDROMORPHONE HCL 1 MG/ML IJ SOLN
0.2500 mg | INTRAMUSCULAR | Status: DC | PRN
  Administered 2019-07-30 (×2): 0.5 mg via INTRAVENOUS

## 2019-07-30 MED ORDER — MIDAZOLAM HCL 2 MG/2ML IJ SOLN
1.0000 mg | INTRAMUSCULAR | Status: DC | PRN
  Administered 2019-07-30: 2 mg via INTRAVENOUS

## 2019-07-30 MED ORDER — MEPERIDINE HCL 25 MG/ML IJ SOLN: 6.2500 mg | INTRAMUSCULAR | Status: DC | PRN

## 2019-07-30 MED ORDER — LACTATED RINGERS IV SOLN: INTRAVENOUS | Status: DC

## 2019-07-30 MED ORDER — FENTANYL CITRATE (PF) 100 MCG/2ML IJ SOLN
INTRAMUSCULAR | Status: AC
  Filled 2019-07-30: qty 2

## 2019-07-30 MED ORDER — DOCUSATE SODIUM 100 MG PO CAPS
100.0000 mg | ORAL_CAPSULE | Freq: Every day | ORAL | 2 refills | Status: DC | PRN
Start: 2019-07-30 — End: 2019-12-30

## 2019-07-30 MED ORDER — PROMETHAZINE HCL 25 MG/ML IJ SOLN: 6.2500 mg | INTRAMUSCULAR | Status: DC | PRN

## 2019-07-30 MED ORDER — OXYCODONE HCL 5 MG PO TABS: 5.0000 mg | ORAL_TABLET | ORAL | 0 refills | Status: AC | PRN

## 2019-07-30 MED ORDER — VANCOMYCIN HCL 500 MG IV SOLR
INTRAVENOUS | Status: AC
  Filled 2019-07-30: qty 500

## 2019-07-30 MED ORDER — CEFAZOLIN SODIUM-DEXTROSE 2-3 GM-%(50ML) IV SOLR
INTRAVENOUS | Status: DC | PRN
Start: 2019-07-30 — End: 2019-07-30
  Administered 2019-07-30: 2 g via INTRAVENOUS

## 2019-07-30 MED ORDER — SCOPOLAMINE 1 MG/3DAYS TD PT72
MEDICATED_PATCH | TRANSDERMAL | Status: AC
  Filled 2019-07-30: qty 1

## 2019-07-30 MED ORDER — 0.9 % SODIUM CHLORIDE (POUR BTL) OPTIME
TOPICAL | Status: DC | PRN
  Administered 2019-07-30: 200 mL

## 2019-07-30 MED ORDER — DEXAMETHASONE SODIUM PHOSPHATE 10 MG/ML IJ SOLN
INTRAMUSCULAR | Status: DC | PRN
  Administered 2019-07-30: 4 mg via INTRAVENOUS

## 2019-07-30 MED ORDER — FENTANYL CITRATE (PF) 100 MCG/2ML IJ SOLN
50.0000 ug | INTRAMUSCULAR | Status: DC | PRN
  Administered 2019-07-30: 100 ug via INTRAVENOUS

## 2019-07-30 MED ORDER — SCOPOLAMINE 1 MG/3DAYS TD PT72
1.0000 | MEDICATED_PATCH | TRANSDERMAL | Status: DC
  Administered 2019-07-30: 1.5 mg via TRANSDERMAL

## 2019-07-30 MED ORDER — ONDANSETRON HCL 4 MG/2ML IJ SOLN
INTRAMUSCULAR | Status: DC | PRN
  Administered 2019-07-30: 4 mg via INTRAVENOUS

## 2019-07-30 MED ORDER — SENNA 8.6 MG PO TABS
2.0000 | ORAL_TABLET | Freq: Two times a day (BID) | ORAL | 0 refills | Status: DC
Start: 2019-07-30 — End: 2019-12-30

## 2019-07-30 MED ORDER — ONDANSETRON HCL 4 MG PO TABS: 4.0000 mg | ORAL_TABLET | Freq: Every day | ORAL | 0 refills | Status: DC | PRN

## 2019-07-30 MED ORDER — ROPIVACAINE HCL 7.5 MG/ML IJ SOLN
INTRAMUSCULAR | Status: DC | PRN
Start: 2019-07-30 — End: 2019-07-30
  Administered 2019-07-30: 20 mL via PERINEURAL

## 2019-07-30 MED ORDER — BUPIVACAINE HCL (PF) 0.5 % IJ SOLN
INTRAMUSCULAR | Status: AC
  Filled 2019-07-30: qty 30

## 2019-07-30 MED ORDER — ACETAMINOPHEN 500 MG PO TABS
1000.0000 mg | ORAL_TABLET | Freq: Once | ORAL | Status: AC
  Administered 2019-07-30: 1000 mg via ORAL

## 2019-07-30 MED ORDER — ACETAMINOPHEN 500 MG PO TABS
ORAL_TABLET | ORAL | Status: AC
  Filled 2019-07-30: qty 2

## 2019-07-30 MED ORDER — SODIUM CHLORIDE 0.9 % IV SOLN: INTRAVENOUS | Status: DC

## 2019-07-30 MED ORDER — BUPIVACAINE-EPINEPHRINE (PF) 0.5% -1:200000 IJ SOLN
INTRAMUSCULAR | Status: DC | PRN
Start: 2019-07-30 — End: 2019-07-30
  Administered 2019-07-30: 30 mL via PERINEURAL

## 2019-07-30 MED ORDER — PROPOFOL 500 MG/50ML IV EMUL
INTRAVENOUS | Status: DC | PRN
Start: 2019-07-30 — End: 2019-07-30
  Administered 2019-07-30: 25 ug/kg/min via INTRAVENOUS

## 2019-07-30 MED ORDER — VANCOMYCIN HCL 500 MG IV SOLR
INTRAVENOUS | Status: DC | PRN
  Administered 2019-07-30: 500 mg via TOPICAL

## 2019-07-30 MED ORDER — LIDOCAINE HCL (CARDIAC) PF 100 MG/5ML IV SOSY
PREFILLED_SYRINGE | INTRAVENOUS | Status: DC | PRN
  Administered 2019-07-30: 30 mg via INTRAVENOUS

## 2019-07-30 MED ORDER — MIDAZOLAM HCL 2 MG/2ML IJ SOLN: 0.5000 mg | Freq: Once | INTRAMUSCULAR | Status: DC | PRN

## 2019-07-30 MED ORDER — PROPOFOL 10 MG/ML IV BOLUS
INTRAVENOUS | Status: DC | PRN
  Administered 2019-07-30: 150 mg via INTRAVENOUS

## 2019-07-30 MED ORDER — ASPIRIN EC 81 MG PO TBEC
81.0000 mg | DELAYED_RELEASE_TABLET | Freq: Two times a day (BID) | ORAL | 0 refills | Status: DC
Start: 2019-07-30 — End: 2020-06-01

## 2019-07-30 MED FILL — ONDANSETRON HCL 4 MG TABS: 4 | 30 days supply | Qty: 30 | Fill #0

## 2019-07-30 MED FILL — oxyCODONE HCL 5 MG TABS: 5 | 5 days supply | Qty: 30 | Fill #0

## 2019-07-30 SURGICAL SUPPLY — 80 items
BANDAGE ESMARK 6X9 LF (GAUZE/BANDAGES/DRESSINGS) IMPLANT
BIT DRILL 2.4 AO COUPLING CANN (BIT) ×2 IMPLANT
BLADE AVERAGE 25X9 (BLADE) IMPLANT
BLADE MICRO SAGITTAL (BLADE) ×2 IMPLANT
BLADE MINI RND TIP GREEN BEAV (BLADE) ×2 IMPLANT
BLADE SURG 15 STRL LF DISP TIS (BLADE) ×4 IMPLANT
BLADE SURG 15 STRL SS (BLADE) ×8
BNDG COHESIVE 4X5 TAN STRL (GAUZE/BANDAGES/DRESSINGS) ×2 IMPLANT
BNDG COHESIVE 6X5 TAN STRL LF (GAUZE/BANDAGES/DRESSINGS) ×2 IMPLANT
BNDG ESMARK 6X9 LF (GAUZE/BANDAGES/DRESSINGS)
CANISTER SUCT 1200ML W/VALVE (MISCELLANEOUS) ×2 IMPLANT
CHLORAPREP W/TINT 26 (MISCELLANEOUS) ×2 IMPLANT
COVER BACK TABLE 60X90IN (DRAPES) ×2 IMPLANT
COVER WAND RF STERILE (DRAPES) IMPLANT
CUFF TOURN SGL QUICK 34 (TOURNIQUET CUFF) ×2
CUFF TRNQT CYL 34X4.125X (TOURNIQUET CUFF) ×1 IMPLANT
DRAPE EXTREMITY T 121X128X90 (DISPOSABLE) ×2 IMPLANT
DRAPE OEC MINIVIEW 54X84 (DRAPES) ×2 IMPLANT
DRAPE U-SHAPE 47X51 STRL (DRAPES) ×2 IMPLANT
DRSG MEPITEL 4X7.2 (GAUZE/BANDAGES/DRESSINGS) ×2 IMPLANT
DRSG PAD ABDOMINAL 8X10 ST (GAUZE/BANDAGES/DRESSINGS) ×4 IMPLANT
ELECT REM PT RETURN 9FT ADLT (ELECTROSURGICAL) ×2
ELECTRODE REM PT RTRN 9FT ADLT (ELECTROSURGICAL) ×1 IMPLANT
GAUZE SPONGE 4X4 12PLY STRL (GAUZE/BANDAGES/DRESSINGS) ×2 IMPLANT
GLOVE BIOGEL PI IND STRL 7.0 (GLOVE) ×2 IMPLANT
GLOVE BIOGEL PI IND STRL 8 (GLOVE) ×2 IMPLANT
GLOVE BIOGEL PI INDICATOR 7.0 (GLOVE) ×2
GLOVE BIOGEL PI INDICATOR 8 (GLOVE) ×2
GLOVE SURG SS PI 7.0 STRL IVOR (GLOVE) ×2 IMPLANT
GLOVE SURG SYN 8.0 (GLOVE) ×4 IMPLANT
GOWN STRL REUS W/ TWL LRG LVL3 (GOWN DISPOSABLE) ×1 IMPLANT
GOWN STRL REUS W/ TWL XL LVL3 (GOWN DISPOSABLE) ×3 IMPLANT
GOWN STRL REUS W/TWL LRG LVL3 (GOWN DISPOSABLE) ×2
GOWN STRL REUS W/TWL XL LVL3 (GOWN DISPOSABLE) ×6
K-WIRE TROC 1.25X150 (WIRE) ×4
KIT ACCESSORY DRILL 5 (KITS) ×2 IMPLANT
KWIRE TROC 1.25X150 (WIRE) ×2 IMPLANT
NDL SUT 6 .5 CRC .975X.05 MAYO (NEEDLE) ×1 IMPLANT
NEEDLE HYPO 22GX1.5 SAFETY (NEEDLE) ×2 IMPLANT
NEEDLE MAYO TAPER (NEEDLE) ×2
NS IRRIG 1000ML POUR BTL (IV SOLUTION) ×2 IMPLANT
PAD CAST 4YDX4 CTTN HI CHSV (CAST SUPPLIES) ×1 IMPLANT
PADDING CAST COTTON 4X4 STRL (CAST SUPPLIES) ×2
PADDING CAST COTTON 6X4 STRL (CAST SUPPLIES) ×2 IMPLANT
PENCIL SMOKE EVACUATOR (MISCELLANEOUS) ×2 IMPLANT
SANITIZER HAND PURELL 535ML FO (MISCELLANEOUS) ×2 IMPLANT
SCREW CANN 4.0X50 (Screw) ×2 IMPLANT
SCREW CANN PT 50X4 NS SM (Screw) ×1 IMPLANT
SCREW CANNULATED 4.0X48PT (Screw) ×2 IMPLANT
SCREW PEEK TENODESIS 6X12MM (Screw) ×2 IMPLANT
SET BASIN DAY SURGERY F.S. (CUSTOM PROCEDURE TRAY) ×2 IMPLANT
SHEET MEDIUM DRAPE 40X70 STRL (DRAPES) ×2 IMPLANT
SLEEVE SCD COMPRESS KNEE MED (MISCELLANEOUS) ×2 IMPLANT
SPLINT FAST PLASTER 5X30 (CAST SUPPLIES) ×20
SPLINT PLASTER CAST FAST 5X30 (CAST SUPPLIES) ×20 IMPLANT
SPONGE LAP 18X18 RF (DISPOSABLE) ×2 IMPLANT
STOCKINETTE 6  STRL (DRAPES) ×2
STOCKINETTE 6 STRL (DRAPES) ×1 IMPLANT
SUCTION FRAZIER HANDLE 10FR (MISCELLANEOUS) ×2
SUCTION TUBE FRAZIER 10FR DISP (MISCELLANEOUS) ×1 IMPLANT
SUT BONE WAX W31G (SUTURE) IMPLANT
SUT ETHIBOND 2 OS 4 DA (SUTURE) IMPLANT
SUT ETHILON 3 0 PS 1 (SUTURE) ×4 IMPLANT
SUT FIBERWIRE #2 38 T-5 BLUE (SUTURE)
SUT FIBERWIRE 2-0 18 17.9 3/8 (SUTURE)
SUT MNCRL AB 3-0 PS2 18 (SUTURE) ×2 IMPLANT
SUT VIC AB 0 SH 27 (SUTURE) ×2 IMPLANT
SUT VIC AB 2-0 SH 18 (SUTURE) IMPLANT
SUT VIC AB 2-0 SH 27 (SUTURE)
SUT VIC AB 2-0 SH 27XBRD (SUTURE) IMPLANT
SUT VICRYL 0 UR6 27IN ABS (SUTURE) ×2 IMPLANT
SUTURE FIBERWR #2 38 T-5 BLUE (SUTURE) IMPLANT
SUTURE FIBERWR 2-0 18 17.9 3/8 (SUTURE) IMPLANT
SUTURE TAPE 1.3 FIBERLOP 20 ST (SUTURE) IMPLANT
SUTURETAPE 1.3 FIBERLOOP 20 ST (SUTURE)
SYR BULB EAR ULCER 3OZ GRN STR (SYRINGE) ×2 IMPLANT
SYR CONTROL 10ML LL (SYRINGE) IMPLANT
TOWEL GREEN STERILE FF (TOWEL DISPOSABLE) ×4 IMPLANT
TUBE CONNECTING 20X1/4 (TUBING) ×2 IMPLANT
UNDERPAD 30X36 HEAVY ABSORB (UNDERPADS AND DIAPERS) ×2 IMPLANT

## 2019-07-30 NOTE — Anesthesia Postprocedure Evaluation (Signed)
Anesthesia Post Note  Patient: Kathleen Vang  Procedure(s) Performed: LEFT GASTROC RECESSION (Left Foot) CALCANEAL OSTEOTOMY (Left Foot) EXCISION  LEFT ACCESSSORY NAVICULAR, POSTERIOR TIBIAL TENDON RECONSTRUCTION (Left Foot) FLEXOR DIGITORUM LONGUS TRANSFER LEFT (Left Foot)     Patient location during evaluation: PACU Anesthesia Type: General Level of consciousness: awake and alert, oriented and patient cooperative Pain management: pain level controlled Vital Signs Assessment: post-procedure vital signs reviewed and stable Respiratory status: spontaneous breathing, nonlabored ventilation and respiratory function stable Cardiovascular status: blood pressure returned to baseline and stable Postop Assessment: no apparent nausea or vomiting Anesthetic complications: no    Last Vitals:  Vitals:   07/30/19 0930 07/30/19 0950  BP: (!) 122/96 (!) 147/73  Pulse: 78 71  Resp: 13 18  Temp:  36.5 C  SpO2: 100% 100%    Last Pain:  Vitals:   07/30/19 0950  TempSrc: Oral  PainSc: 2                  Json Koelzer,E. Giani Betzold

## 2019-07-30 NOTE — Op Note (Signed)
07/30/2019  9:11 AM  PATIENT:  Kathleen Vang  49 y.o. female  PRE-OPERATIVE DIAGNOSIS: 1.  Left posterior tibial tendon dysfunction 2.  Painful left accessory navicular 3.  Short left Achilles tendon  POST-OPERATIVE DIAGNOSIS: Same  Procedure(s): 1.  Left gastrocnemius recession through separate incision 2.  Left medializing calcaneal osteotomy through separate incision 3.  Excision of painful left accessory navicular with posterior tibial tendon reconstruction (Kidner procedure) 4.  Left deltoid ligament (spring component) repair 5.  Deep transfer of the left flexor digitorum longus tendon to the navicular 6.  AP, lateral and Harris heel radiographs of the left foot  SURGEON:  Wylene Simmer, MD  ASSISTANT: Mechele Claude, PA-C  ANESTHESIA:   General, regional  EBL:  minimal   TOURNIQUET:   Total Tourniquet Time Documented: Thigh (Left) - 66 minutes Total: Thigh (Left) - 66 minutes  COMPLICATIONS:  None apparent  DISPOSITION:  Extubated, awake and stable to recovery.  INDICATION FOR PROCEDURE: The patient is a 49 year old female without significant past medical history.  She has a long history of left ankle and hindfoot pain.  Signs and symptoms are consistent with posterior tibial tendon dysfunction and a painful accessory navicular.  She has failed nonoperative treatment to date including activity modification, bracing, oral anti-inflammatories and therapy.  She presents now for surgical treatment of this painful and limiting condition.  The risks and benefits of the alternative treatment options have been discussed in detail.  The patient wishes to proceed with surgery and specifically understands risks of bleeding, infection, nerve damage, blood clots, need for additional surgery, amputation and death.  PROCEDURE IN DETAIL:  After pre operative consent was obtained, and the correct operative site was identified, the patient was brought to the operating room and placed supine on  the OR table.  Anesthesia was administered.  Pre-operative antibiotics were administered.  A surgical timeout was taken.  The left lower extremity was prepped and draped in standard sterile fashion with a tourniquet around the thigh.  The extremity was elevated and the tourniquet was inflated to 250 mmHg.  A longitudinal incision was made over the medial calf.  Dissection was carried down through the subcutaneous tissues and superficial fascia.  The plantaris tendon was identified.  It was divided under direct vision.  The gastrocnemius tendon was identified.  Was divided from medial to lateral under direct vision.  Care was taken to protect the sural nerve.  The ankle would then dorsiflex 20 degrees with the knee extended.  The wound was irrigated and sprinkled with vancomycin powder and closed with Monocryl and nylon.  Attention was turned to the lateral hindfoot where an oblique incision was made over the lateral wall of the calcaneus.  Dissection was carried sharply down to the bone.  Periosteum was elevated.  An osteotomy was made with the oscillating saw and mobilized.  The tuberosity was translated medially several millimeters.  The osteotomy was fixed with 2 4 mm partially-threaded cannulated screws from the [employer] 4 mm 5 mm cannulated screw set.  Radiographs confirmed appropriate translation of the tuberosity in appropriate position of both screws.  The cut surface of bone was smoothed.  The wounds were irrigated and sprinkled with vancomycin powder.  Both wounds were closed with nylon.  Attention was turned to the medial hindfoot where an incision was made over the posterior tibial tendon.  Dissection was carried sharply down to the sheath and it was opened again sharply.  The posterior tibial tendon was identified.  At its insertion a needle was placed through the tendon into the prominent bone.  A radiograph confirmed that this was the accessory navicular.  Sharp dissection was carried down  through the tendon longitudinally between its fibers to the level of the accessory navicular.  Subperiosteal dissection was carried around the accessory navicular and it was removed in its entirety.  Repeat radiograph confirmed complete removal of the accessory navicular.  The tendon was carefully examined.  Degenerated portions were removed with a scalpel.  The spring ligament was identified.  There was superficial tearing of more than 50%.  Figure-of-eight sutures of 0 Vicryl were used to repair the tear in the spring ligament portion of the deltoid.  The posterior tibial tendon was then reconstructed with 0 Vicryl sutures.  The tendon sheath for the FDL was identified.  It was opened sharply and dissected distally down to the knot of Sherilyn Cooter.  The FDL was transected and a whipstitch placed in the end of the tendon.  A K wire was inserted into the tuberosity of the navicular.  Radiograph confirmed appropriate position of the wire.  The wire was overdrilled with a 5 mm reamer.  The tendon was pulled up through the hole and secured with a 6 mm [facility] medical bolt.  The end of the tendon was repaired to the adjacent periosteum with a free needle and 0 Vicryl from the whipstitch.  Final AP, lateral, oblique and Harris heel radiographs of the left foot showed interval removal of the accessory navicular and calcaneus tuberosity translation.  Hardware is appropriately positioned and of the appropriate lengths.  Medial wounds were irrigated copiously.  Vancomycin powder was sprinkled in the wound.  The tendon sheath was repaired with 0 Vicryl.  Subcutaneous tissues were approximated with Monocryl.  Skin incision was closed with nylon.  Sterile dressings were applied followed by a well-padded short leg splint.  The tourniquet was released after application of the dressings.  The patient was awakened from anesthesia and transported to the recovery room in stable condition.  FOLLOW UP PLAN: Nonweightbearing on the left  lower extremity.  Aspirin for DVT prophylaxis.  Follow-up in the office in 2 weeks for suture removal and conversion to a short leg cast.  Plan 6 weeks nonweightbearing immobilization postoperatively.   RADIOGRAPHS: AP, lateral, oblique and Harris heel radiographs of the left foot are obtained intraoperatively.  These show interval translation of the calcaneal tuberosity and excision of the accessory navicular.  Hardware is appropriately positioned and of the appropriate lengths.  No acute injuries are noted.    Alfredo Martinez PA-C was present and scrubbed for the duration of the operative case. His assistance was essential in positioning the patient, prepping and draping, gaining and maintaining exposure, performing the operation, closing and dressing the wounds and applying the splint.

## 2019-07-30 NOTE — Transfer of Care (Signed)
Immediate Anesthesia Transfer of Care Note  Patient: Kathleen Vang  Procedure(s) Performed: LEFT GASTROC RECESSION (Left Foot) CALCANEAL OSTEOTOMY (Left Foot) EXCISION  LEFT ACCESSSORY NAVICULAR, POSTERIOR TIBIAL TENDON RECONSTRUCTION (Left Foot) FLEXOR DIGITORUM LONGUS TRANSFER LEFT (Left Foot)  Patient Location: PACU  Anesthesia Type:GA combined with regional for post-op pain  Level of Consciousness: awake, alert , oriented and patient cooperative  Airway & Oxygen Therapy: Patient Spontanous Breathing and Patient connected to face mask oxygen  Post-op Assessment: Report given to RN and Post -op Vital signs reviewed and stable  Post vital signs: Reviewed and stable  Last Vitals:  Vitals Value Taken Time  BP 127/98 07/30/19 0904  Temp    Pulse 114 07/30/19 0906  Resp 26 07/30/19 0906  SpO2 86 % 07/30/19 0906  Vitals shown include unvalidated device data.  Last Pain:  Vitals:   07/30/19 0634  PainSc: 3       Patients Stated Pain Goal: 3 (07/30/19 0475)  Complications: No apparent anesthesia complications

## 2019-07-30 NOTE — Anesthesia Procedure Notes (Signed)
Procedure Name: LMA Insertion Date/Time: 07/30/2019 7:38 AM Performed by: Chet Greenley, Jewel Baize, CRNA Pre-anesthesia Checklist: Patient identified, Emergency Drugs available, Suction available and Patient being monitored Patient Re-evaluated:Patient Re-evaluated prior to induction Oxygen Delivery Method: Circle system utilized Preoxygenation: Pre-oxygenation with 100% oxygen Induction Type: IV induction Ventilation: Mask ventilation without difficulty LMA: LMA inserted LMA Size: 4.0 Number of attempts: 1 Airway Equipment and Method: Bite block Placement Confirmation: positive ETCO2 Tube secured with: Tape Dental Injury: Teeth and Oropharynx as per pre-operative assessment

## 2019-07-30 NOTE — Discharge Instructions (Signed)
Kathleen Arthurs, MD [facility]  Please read the following information regarding your care after surgery.  Medications  You only need a prescription for the narcotic pain medicine (ex. oxycodone, Percocet, Norco).  All of the other medicines listed below are available over the counter. X Aleve 2 pills twice a day for the first 3 days after surgery. X acetominophen (Tylenol) 650 mg every 4-6 hours as you need for minor to moderate pain X oxycodone as prescribed for severe pain X zofran as prescribed for nausea  Narcotic pain medicine (ex. oxycodone, Percocet, Vicodin) will cause constipation.  To prevent this problem, take the following medicines while you are taking any pain medicine. X docusate sodium (Colace) 100 mg twice a day X senna (Senokot) 2 tablets twice a day  X To help prevent blood clots, take a baby aspirin (81 mg) twice a day for six weeks after surgery.  You should also get up every hour while you are awake to move around.    Weight Bearing X Do not bear any weight on the operated leg or foot.  Cast / Splint / Dressing X Keep your splint, cast or dressing clean and dry.  Don't put anything (coat hanger, pencil, etc) down inside of it.  If it gets damp, use a hair dryer on the cool setting to dry it.  If it gets soaked, call the office to schedule an appointment for a cast change.    After your dressing, cast or splint is removed; you may shower, but do not soak or scrub the wound.  Allow the water to run over it, and then gently pat it dry.  Swelling It is normal for you to have swelling where you had surgery.  To reduce swelling and pain, keep your toes above your nose for at least 3 days after surgery.  It may be necessary to keep your foot or leg elevated for several weeks.  If it hurts, it should be elevated.  Follow Up Call my office at [phone] when you are discharged from the hospital or surgery center to schedule an appointment to be seen two weeks after  surgery.  Call my office at [phone] if you develop a fever >101.5 F, nausea, vomiting, bleeding from the surgical site or severe pain.      Next dose of Tylenol can be given at 1:15pm if needed.     Post Anesthesia Home Care Instructions  Activity: Get plenty of rest for the remainder of the day. A responsible individual must stay with you for 24 hours following the procedure.  For the next 24 hours, DO NOT: -Drive a car -Advertising copywriter -Drink alcoholic beverages -Take any medication unless instructed by your physician -Make any legal decisions or sign important papers.  Meals: Start with liquid foods such as gelatin or soup. Progress to regular foods as tolerated. Avoid greasy, spicy, heavy foods. If nausea and/or vomiting occur, drink only clear liquids until the nausea and/or vomiting subsides. Call your physician if vomiting continues.  Special Instructions/Symptoms: Your throat may feel dry or sore from the anesthesia or the breathing tube placed in your throat during surgery. If this causes discomfort, gargle with warm salt water. The discomfort should disappear within 24 hours.  If you had a scopolamine patch placed behind your ear for the management of post- operative nausea and/or vomiting:  1. The medication in the patch is effective for 72 hours, after which it should be removed.  Wrap patch in a tissue and discard  in the trash. Wash hands thoroughly with soap and water. 2. You may remove the patch earlier than 72 hours if you experience unpleasant side effects which may include dry mouth, dizziness or visual disturbances. 3. Avoid touching the patch. Wash your hands with soap and water after contact with the patch.   Regional Anesthesia Blocks  1. Numbness or the inability to move the "blocked" extremity may last from 3-48 hours after placement. The length of time depends on the medication injected and your individual response to the medication. If the  numbness is not going away after 48 hours, call your surgeon.  2. The extremity that is blocked will need to be protected until the numbness is gone and the  Strength has returned. Because you cannot feel it, you will need to take extra care to avoid injury. Because it may be weak, you may have difficulty moving it or using it. You may not know what position it is in without looking at it while the block is in effect.  3. For blocks in the legs and feet, returning to weight bearing and walking needs to be done carefully. You will need to wait until the numbness is entirely gone and the strength has returned. You should be able to move your leg and foot normally before you try and bear weight or walk. You will need someone to be with you when you first try to ensure you do not fall and possibly risk injury.  4. Bruising and tenderness at the needle site are common side effects and will resolve in a few days.  5. Persistent numbness or new problems with movement should be communicated to the surgeon or the [place] [phone] [place] [phone]).

## 2019-07-30 NOTE — Anesthesia Procedure Notes (Signed)
Anesthesia Regional Block: Adductor canal block   Pre-Anesthetic Checklist: ,, timeout performed, Correct Patient, Correct Site, Correct Laterality, Correct Procedure, Correct Position, site marked, Risks and benefits discussed,  Surgical consent,  Pre-op evaluation,  At surgeon's request and post-op pain management  Laterality: Left and Lower  Prep: chloraprep       Needles:  Injection technique: Single-shot  Needle Type: Echogenic Needle     Needle Length: 9cm  Needle Gauge: 21     Additional Needles:   Procedures:,,,, ultrasound used (permanent image in chart),,,,  Narrative:  Start time: 07/30/2019 7:07 AM End time: 07/30/2019 7:13 AM Injection made incrementally with aspirations every 5 mL.  Performed by: Personally  Anesthesiologist: Jairo Ben, MD  Additional Notes: Pt identified in Holding room.  Monitors applied. Working IV access confirmed. Sterile prep L thigh.  #21ga ECHOgenic needle into adductor canal with US guidance.  20cc 0.75% Ropivacaine injected incrementally after negative test dose.  Patient asymptomatic, VSS, no heme aspirated, tolerated well.  Sandford Craze, MD

## 2019-07-30 NOTE — H&P (Signed)
Kathleen Vang is an 49 y.o. female.   Chief Complaint: left foot pain HPI: The patient is a 49 year old female without significant past medical history.  She has a long history of left medial ankle and hindfoot pain.  She has signs and symptoms of a painful accessory navicular as well as posterior tibial tendon dysfunction.  She presents now for operative treatment having failed nonoperative treatment to date.  Past Medical History:  Diagnosis Date  . Anemia   . Arthritis   . Complication of anesthesia   . Herpes genitalis in women 2010  . MRSA infection greater than 3 months ago 2009  . PONV (postoperative nausea and vomiting)   . Sarcoid 1993   in remission per pt    Past Surgical History:  Procedure Laterality Date  . LYMPH NODE BIOPSY  1993  . TUBAL LIGATION  1988    Family History  Problem Relation Age of Onset  . Other Mother   . Colon cancer Neg Hx   . Colon polyps Neg Hx   . Esophageal cancer Neg Hx   . Rectal cancer Neg Hx   . Stomach cancer Neg Hx    Social History:  reports that she quit smoking about a year ago. Her smoking use included cigarettes. She smoked 0.25 packs per day. She has never used smokeless tobacco. She reports current alcohol use of about 6.0 standard drinks of alcohol per week. She reports that she does not use drugs.  Allergies:  Allergies  Allergen Reactions  . Latex Hives and Itching  . Amoxicillin Hives  . Codeine Nausea And Vomiting  . Penicillins Hives    REACTION: hives    Medications Prior to Admission  Medication Sig Dispense Refill  . diphenhydrAMINE HCl, Sleep, (UNISOM SLEEPGELS PO) Take by mouth.    . Multiple Vitamins-Minerals (MULTIVITAMIN WITH MINERALS) tablet Take by mouth.    . valACYclovir (VALTREX) 500 MG tablet valacyclovir 500 mg tablet  TAKE 1 TABLET BY MOUTH ONCE DAILY    . venlafaxine XR (EFFEXOR XR) 37.5 MG 24 hr capsule Take 1 capsule (37.5 mg total) by mouth daily with breakfast. 30 capsule 1    Results for  orders placed or performed during the hospital encounter of 07/29/19 (from the past 48 hour(s))  SARS CORONAVIRUS 2 (TAT 6-24 HRS) Nasopharyngeal Nasopharyngeal Swab     Status: None   Collection Time: 07/29/19  2:42 PM   Specimen: Nasopharyngeal Swab  Result Value Ref Range   SARS Coronavirus 2 NEGATIVE NEGATIVE    Comment: (NOTE) SARS-CoV-2 target nucleic acids are NOT DETECTED. The SARS-CoV-2 RNA is generally detectable in upper and lower respiratory specimens during the acute phase of infection. Negative results do not preclude SARS-CoV-2 infection, do not rule out co-infections with other pathogens, and should not be used as the sole basis for treatment or other patient management decisions. Negative results must be combined with clinical observations, patient history, and epidemiological information. The expected result is Negative. Fact Sheet for Patients: SugarRoll.be Fact Sheet for Healthcare Providers: https://www.woods-mathews.com/ This test is not yet approved or cleared by the [place] FDA and  has been authorized for detection and/or diagnosis of SARS-CoV-2 by FDA under an Emergency Use Authorization (EUA). This EUA will remain  in effect (meaning this test can be used) for the duration of the COVID-19 declaration under Section 56 4(b)(1) of the Act, 21 U.S.C. section 360bbb-3(b)(1), unless the authorization is terminated or revoked sooner. Performed at [facility], [place]  [address] [place], [place] [address]    No results found.  Review of Systems no recent fever, chills, nausea, vomiting or changes in her appetite  Blood pressure (!) 145/79, pulse 68, temperature 98.4 F (36.9 C), resp. rate 12, height 5\' 2"  (1.575 m), weight 76 kg, last menstrual period 07/10/2019, SpO2 100 %. Physical Exam  Well-nourished well-developed woman in no apparent distress.  Alert and oriented x4.  Normal mood and affect.  Gait is  antalgic to the left.  Left foot has tenderness to palpation at the medial navicular.  No lymphadenopathy.  Pulses are palpable.  Sensibility to light touch is intact in the saphenous nerve distribution.   Assessment/Plan Painful left accessory navicular, posterior tibial tendon dysfunction and short Achilles tendon -to the operating room today for surgical treatment of these painful and limiting left ankle and hindfoot conditions.  The risks and benefits of the alternative treatment options have been discussed in detail.  The patient wishes to proceed with surgery and specifically understands risks of bleeding, infection, nerve damage, blood clots, need for additional surgery, amputation and death.   09/09/2019, MD 2019/08/04, 7:32 AM

## 2019-07-30 NOTE — Anesthesia Procedure Notes (Signed)
Anesthesia Regional Block: Popliteal block   Pre-Anesthetic Checklist: ,, timeout performed, Correct Patient, Correct Site, Correct Laterality, Correct Procedure, Correct Position, site marked, Risks and benefits discussed,  Surgical consent,  Pre-op evaluation,  At surgeon's request and post-op pain management  Laterality: Left and Lower  Prep: chloraprep       Needles:  Injection technique: Single-shot  Needle Type: Echogenic Needle     Needle Length: 9cm  Needle Gauge: 21     Additional Needles:   Procedures:,,,, ultrasound used (permanent image in chart),,,,  Narrative:  Start time: 07/30/2019 7:16 AM End time: 07/30/2019 7:21 AM Injection made incrementally with aspirations every 5 mL.  Performed by: Personally  Anesthesiologist: Jairo Ben, MD  Additional Notes: Pt identified in Holding room.  Monitors applied. Working IV access confirmed. Sterile prep L lateral knee.  #21ga ECHOgenic needle to toe twitch at 0.4 mmHg with US guidance .  30cc 0.5% Bupivacaine with 1:200k epi injected incrementally after negative test dose.  Patient asymptomatic, VSS, no heme aspirated, tolerated well.  Sandford Craze, MD

## 2019-07-30 NOTE — Anesthesia Preprocedure Evaluation (Addendum)
Anesthesia Evaluation  Patient identified by MRN, date of birth, ID band Patient awake    Reviewed: Allergy & Precautions, NPO status , Patient's Chart, lab work & pertinent test results  History of Anesthesia Complications (+) PONV  Airway Mallampati: I  TM Distance: >3 FB Neck ROM: Full    Dental  (+) Dental Advisory Given   Pulmonary former smoker,  07/29/2019 SARS coronavirus NEG   breath sounds clear to auscultation       Cardiovascular negative cardio ROS   Rhythm:Regular Rate:Normal     Neuro/Psych  Headaches, PSYCHIATRIC DISORDERS Depression    GI/Hepatic negative GI ROS, Neg liver ROS,   Endo/Other  negative endocrine ROS  Renal/GU negative Renal ROS     Musculoskeletal   Abdominal   Peds  Hematology   Anesthesia Other Findings   Reproductive/Obstetrics S/p BTL, denies pregnancy                            Anesthesia Physical Anesthesia Plan  ASA: II  Anesthesia Plan: General   Post-op Pain Management: GA combined w/ Regional for post-op pain   Induction: Intravenous  PONV Risk Score and Plan: 4 or greater and Scopolamine patch - Pre-op, Dexamethasone and Ondansetron  Airway Management Planned: LMA  Additional Equipment:   Intra-op Plan:   Post-operative Plan:   Informed Consent: I have reviewed the patients History and Physical, chart, labs and discussed the procedure including the risks, benefits and alternatives for the proposed anesthesia with the patient or authorized representative who has indicated his/her understanding and acceptance.     Dental advisory given  Plan Discussed with: CRNA and Surgeon  Anesthesia Plan Comments: (Plan routine monitors, GA with adductor canal and popliteal blocks for post op analgesia)       Anesthesia Quick Evaluation

## 2019-07-30 NOTE — Progress Notes (Signed)
Assisted Dr. Carswell Jackson with left, ultrasound guided, popliteal, adductor canal block. Side rails up, monitors on throughout procedure. See vital signs in flow sheet. Tolerated Procedure well. 

## 2019-09-24 MED FILL — VALACYCLOVIR HCL 500 MG TAB: 500 | 90 days supply | Qty: 90 | Fill #3

## 2019-09-26 ENCOUNTER — Ambulatory Visit: Payer: No Typology Code available for payment source | Attending: Family Medicine

## 2019-10-14 ENCOUNTER — Encounter: Payer: Self-pay | Admitting: Physical Therapy

## 2019-10-14 ENCOUNTER — Ambulatory Visit: Payer: No Typology Code available for payment source | Attending: Family Medicine | Admitting: Physical Therapy

## 2019-10-14 ENCOUNTER — Other Ambulatory Visit: Payer: Self-pay

## 2019-10-14 DIAGNOSIS — R531 Weakness: Secondary | ICD-10-CM | POA: Diagnosis present

## 2019-10-14 NOTE — Therapy (Signed)
[facility] [address] [place], [place], [address] Phone: [phone]   Fax:  [phone]  Physical Therapy Evaluation  Patient Details  Name: Kathleen Vang MRN: [id number] Date of Birth: [birth year] Referring Provider (PT): Toni Arthurs MD   Encounter Date: 10/14/2019   PT End of Session - 10/14/19 0923    Visit Number 1    Number of Visits 1    Date for PT Re-Evaluation 10/14/19    Authorization Type [facility] U[facility]    PT Start Time 0845    PT Stop Time 0923    PT Time Calculation (min) 38 min    Activity Tolerance Patient tolerated treatment well    Behavior During Therapy [facility] for tasks assessed/performed           Past Medical History:  Diagnosis Date  . Anemia   . Arthritis   . Complication of anesthesia   . Herpes genitalis in women 2010  . MRSA infection greater than 3 months ago 2009  . PONV (postoperative nausea and vomiting)   . Sarcoid 1993   in remission per pt    Past Surgical History:  Procedure Laterality Date  . CALCANEAL OSTEOTOMY Left 07/30/2019   Procedure: CALCANEAL OSTEOTOMY;  Surgeon: Toni Arthurs, MD;  Location: [facility];  Service: Orthopedics;  Laterality: Left;  Marland Kitchen GASTROC RECESSION EXTREMITY Left 07/30/2019   Procedure: LEFT GASTROC RECESSION;  Surgeon: Toni Arthurs, MD;  Location: [facility];  Service: Orthopedics;  Laterality: Left;  . LYMPH NODE BIOPSY  1993  . POSTERIOR TIBIAL TENDON REPAIR Left 07/30/2019   Procedure: EXCISION  LEFT ACCESSSORY NAVICULAR, POSTERIOR TIBIAL TENDON RECONSTRUCTION;  Surgeon: Toni Arthurs, MD;  Location: [facility];  Service: Orthopedics;  Laterality: Left;  . TUBAL LIGATION  1988    There were no vitals filed for this visit.    Subjective Assessment - 10/14/19 0848    Subjective pt is a 49 y.o s/p L gastrocnemius recession, calcaneal osteotomy,  excession of acessory navicular, posterior tib reconstruction, flexor  digitorum tendon transfer. on 07/30/2019. she reports she has been doing very well no pain just stiffness in the ankle. she reports she started weaning from the walking boot on Friday. She currently isn't using any AD for ambulation.    How long can you sit comfortably? unlimited    How long can you stand comfortably? unlimited    How long can you walk comfortably? unlimited    Patient Stated Goals unsure as to why she is having PT.    Currently in Pain? No/denies    Pain Location Ankle    Pain Orientation Left    Pain Descriptors / Indicators Aching    Aggravating Factors  N/A              [facility] PT Assessment - 10/14/19 0834      Assessment   Medical Diagnosis L gastrocnemius recession, calcaneal osteotomy,  excession of acessory navicular, posterior tib reconstruction, flexor digitorum tendon transfer.     Referring Provider (PT) Toni Arthurs MD    Onset Date/Surgical Date 07/30/19    Hand Dominance Right    Next MD Visit 11/06/2019    Prior Therapy yes      Precautions   Precautions None      Restrictions   Weight Bearing Restrictions No      Balance Screen   Has the patient fallen in the past 6 months No    Has the patient  had a decrease in activity level because of a fear of falling?  No    Is the patient reluctant to leave their home because of a fear of falling?  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   Vocation Requirements standing/ walking, lifting/ carrying      Cognition   Overall Cognitive Status Within Functional Limits for  tasks assessed      ROM / Strength   AROM / PROM / Strength AROM;PROM;Strength      AROM   Overall AROM  Within functional limits for tasks performed    AROM Assessment Site Ankle    Right/Left Ankle Right;Left      PROM   PROM Assessment Site Ankle    Right/Left Ankle Left      Strength   Overall Strength Comments posterior tib strength L 4/5, and RLE 5/5    Strength Assessment Site Ankle    Right/Left Ankle Right;Left    Right Ankle Dorsiflexion 5/5    Right Ankle Plantar Flexion 5/5    Right Ankle Inversion 5/5    Right Ankle Eversion 5/5    Left Ankle Dorsiflexion 5/5    Left Ankle Plantar Flexion 4+/5    Left Ankle Inversion 4+/5    Left Ankle Eversion 4+/5      Palpation   Palpation comment No areas of tenderness noted      Ambulation/Gait   Ambulation/Gait Yes    Gait Pattern Within Functional Limits      Balance   Balance Assessed Yes      Static Standing Balance   Static Standing Balance -  Activities  Single Leg Stance - Right Leg;Single Leg Stance - Left Leg   RLE 30 second, LLE 18 stretch                     Objective measurements completed on examination: See above findings.       [facility] Adult PT Treatment/Exercise - 10/14/19 0834      Exercises   Exercises Ankle      Ankle Exercises: Seated   Other Seated Ankle Exercises 4- way ankle strength with green band 1 x 10 ea.                  PT Education - 10/14/19 [phone]    Education Details reviewed evaluation findings, provied comprehensive HE pfor ankle and knee/ hp strengtheing with focus on going to fatigue to facilitate endurnace. reviewed walking progress gradually increasing time to promote endurance so when she returnts to work she isn't as fatigued/ sore.    Person(s) Educated Patient    Methods Explanation;Verbal cues;Handout    Comprehension Verbalized understanding;Verbal cues required               PT Long Term Goals - 10/14/19 0928      PT LONG TERM GOAL  #1   Title pt to be  IND with all HEP given to maintain and progress current LOF IND    Time 1    Period Weeks    Status Achieved      PT LONG TERM GOAL #2   Title -      PT LONG TERM GOAL #3   Title -      PT LONG TERM GOAL #4   Title -                  Plan - 10/14/19 0924    Clinical Impression Statement pt presents to [facility] s/p L gastrocnemius recession, calcaneal osteotomy,  excession of acessory navicular, posterior tib reconstruction, flexor digitorum tendon transfer. on 07/30/2019. She has functional ROM reporting no pain throughout assessment. she has functional strength with mild deficits noted with PF/ inversion compared bil. A comprehenisve HEP was provided for general ankle and knee/ hip strengthening with focus on endurance training. At this point based on pt ROM, strength, functional mobility she doesn't requiring physical therapy at this time.    Stability/Clinical Decision Making Stable/Uncomplicated    Clinical Decision Making Low    PT Frequency One time visit    PT Next Visit Plan only one visit           Patient will benefit from skilled therapeutic intervention in order to improve the following deficits and impairments:  Decreased strength  Visit Diagnosis: General weakness     Problem List Patient Active Problem List   Diagnosis Date Noted  . Class 1 obesity with body mass index (BMI) of 30.0 to 30.9 in adult 07/07/2019  . GOITER, UNSPECIFIED 04/28/2007  . DIZZINESS 04/28/2007  . TONSILLAR HYPERTROPHY, UNILATERAL 08/27/2006  . SARCOIDOSIS 04/03/2006  . ANEMIA NOS 04/03/2006  . TOBACCO ABUSE 04/03/2006  . HEADACHE, TENSION 04/03/2006  . DISORDER, DEPRESSIVE NEC 04/03/2006  . HIATAL HERNIA WITH REFLUX 04/03/2006  . EPICONDYLITIS, LATERAL 04/03/2006  . INSOMNIA 04/03/2006  . HELICOBACTER PYLORI GASTRITIS, HX OF 04/03/2006   Lulu Riding PT, DPT, LAT, ATC  10/14/19  9:30 AM      [facility]  [facility] [address] [place], [place], [address] Phone: [phone]   Fax:  [phone]  Name: Taylynn Easton MRN: [id number] Date of Birth: 1970/10/05

## 2019-10-17 ENCOUNTER — Ambulatory Visit: Payer: No Typology Code available for payment source | Attending: Internal Medicine

## 2019-10-17 DIAGNOSIS — Z23 Encounter for immunization: Secondary | ICD-10-CM

## 2019-10-17 NOTE — Progress Notes (Signed)
   Covid-19 Vaccination Clinic  Name:  Dynasia Kercheval    MRN: [id number] DOB: [birth year]  10/17/2019  Ms. Lope was observed post Covid-19 immunization for 15 minutes without incident. She was provided with Vaccine Information Sheet and instruction to access the V-Safe system.   Ms. Fairburn was instructed to call 911 with any severe reactions post vaccine: Marland Kitchen Difficulty breathing  . Swelling of face and throat  . A fast heartbeat  . A bad rash all over body  . Dizziness and weakness   Immunizations Administered    Name Date Dose VIS Date Route   Pfizer COVID-19 Vaccine 10/17/2019 10:47 AM 0.3 mL 04/29/2018 Intramuscular   Manufacturer: [employer], [employer]   Lot: [id number]   NDC: [address]-2

## 2019-10-22 ENCOUNTER — Other Ambulatory Visit (HOSPITAL_COMMUNITY): Payer: Self-pay | Admitting: Obstetrics

## 2019-10-22 MED FILL — NICOTINE 21 MG/24HR PATCH: 21 | 28 days supply | Qty: 28 | Fill #0

## 2019-12-23 MED FILL — VALACYCLOVIR HCL 500 MG TAB: 500 | 90 days supply | Qty: 90 | Fill #0

## 2019-12-30 ENCOUNTER — Other Ambulatory Visit: Payer: Self-pay

## 2019-12-30 ENCOUNTER — Ambulatory Visit (INDEPENDENT_AMBULATORY_CARE_PROVIDER_SITE_OTHER): Payer: No Typology Code available for payment source | Admitting: Family Medicine

## 2019-12-30 ENCOUNTER — Encounter: Payer: Self-pay | Admitting: Family Medicine

## 2019-12-30 ENCOUNTER — Other Ambulatory Visit: Payer: Self-pay | Admitting: Family Medicine

## 2019-12-30 VITALS — BP 120/70 | HR 100 | Resp 16 | Ht 62.0 in | Wt 173.0 lb

## 2019-12-30 DIAGNOSIS — R0781 Pleurodynia: Secondary | ICD-10-CM

## 2019-12-30 DIAGNOSIS — F17200 Nicotine dependence, unspecified, uncomplicated: Secondary | ICD-10-CM | POA: Diagnosis not present

## 2019-12-30 DIAGNOSIS — R232 Flushing: Secondary | ICD-10-CM | POA: Diagnosis not present

## 2019-12-30 MED ORDER — VENLAFAXINE HCL ER 37.5 MG PO CP24: 37.5000 mg | ORAL_CAPSULE | Freq: Every day | ORAL | 1 refills | Status: DC

## 2019-12-30 MED FILL — VENLAFAXINE HCL ER 37.5 MG: 37.5 | 30 days supply | Qty: 30 | Fill #0

## 2019-12-30 NOTE — Patient Instructions (Addendum)
A few things to remember from today's visit:   Hot flashes - Plan: venlafaxine XR (EFFEXOR XR) 37.5 MG 24 hr capsule  If you need refills please call your pharmacy. Do not use My Chart to request refills or for acute issues that need immediate attention.   Let me know if Effexor is helping, otherwise we can try Paroxetine.  Please be sure medication list is accurate. If a new problem present, please set up appointment sooner than planned today.

## 2019-12-30 NOTE — Progress Notes (Signed)
Chief Complaint  Patient presents with   Hot Flashes   HPI: Kathleen Vang is a pleasant 49 y.o. female, who is here today with above concern. Initially she arranged appt to discuss right rib cage soreness fot a few days. No hx of trauma. No skin changes,edema,or erythema on affected area.  Negative for abdominal pain, nausea, vomiting, changes in bowel habits, blood in stool or melena.  Pain has resolved.  She is asking for something that helps hot flashes. Head and face slush feeling,sweating,and heat. It happens day and night but worse at night. Last up to 1-2 min. She has not noted associated palpitations,tremor,or abnormal wt loss.  Problem is interfering with sleep. Last visit, 07/07/19, I recommended Effexor XR 37.5 mg, she is not sure if it helped. She does not remember having side effects.  She is taking OTC sleeping pills, have helped her to sleep in the past, not helping now. She has not identified exacerbating or alleviating factors. LMP: 11/12/19, irregular menstrual periods.  She is drinking 1-2 beers at night. Back to smoking. 6 cig/day. Stress aggravate problem.  Review of Systems  Constitutional: Positive for fatigue. Negative for activity change, appetite change and fever.  HENT: Negative for mouth sores and nosebleeds.   Respiratory: Negative for cough, shortness of breath and wheezing.   Cardiovascular: Negative for chest pain and leg swelling.  Genitourinary: Negative for decreased urine volume, dysuria and hematuria.  Neurological: Negative for syncope, weakness and numbness.  Psychiatric/Behavioral: Positive for sleep disturbance. Negative for confusion.  Rest see pertinent positives and negatives per HPI.  Current Outpatient Medications on File Prior to Visit  Medication Sig Dispense Refill   aspirin EC 81 MG tablet Take 1 tablet (81 mg total) by mouth 2 (two) times daily. 84 tablet 0   diphenhydrAMINE HCl, Sleep, (UNISOM SLEEPGELS PO)  Take by mouth.     Multiple Vitamins-Minerals (MULTIVITAMIN WITH MINERALS) tablet Take by mouth.     ondansetron (ZOFRAN) 4 MG tablet Take 1 tablet (4 mg total) by mouth daily as needed for nausea or vomiting. 30 tablet 0   valACYclovir (VALTREX) 500 MG tablet valacyclovir 500 mg tablet  TAKE 1 TABLET BY MOUTH ONCE DAILY     No current facility-administered medications on file prior to visit.   Past Medical History:  Diagnosis Date   Anemia    Arthritis    Complication of anesthesia    Herpes genitalis in women 2010   MRSA infection greater than 3 months ago 2009   PONV (postoperative nausea and vomiting)    Sarcoid 1993   in remission per pt   Allergies  Allergen Reactions   Latex Hives and Itching   Amoxicillin Hives   Codeine Nausea And Vomiting   Penicillins Hives    REACTION: hives    Social History   Socioeconomic History   Marital status: Single    Spouse name: Not on file   Number of children: 2   Years of education: Not on file   Highest education level: GED or equivalent  Occupational History   Not on file  Tobacco Use   Smoking status: Current Every Day Smoker    Packs/day: 0.25    Types: Cigarettes    Last attempt to quit: 08/04/2018    Years since quitting: 1.4   Smokeless tobacco: Never Used   Tobacco comment: 6 cig/day  Vaping Use   Vaping Use: Every day   Substances: Nicotine, Flavoring  Substance and Sexual  Activity   Alcohol use: Yes    Alcohol/week: 6.0 standard drinks    Types: 6 Cans of beer per week    Comment: 1-2 beer    Drug use: No   Sexual activity: Yes    Birth control/protection: None, Surgical  Other Topics Concern   Not on file  Social History Narrative   Not on file   Social Determinants of Health   Financial Resource Strain:    Difficulty of Paying Living Expenses: Not on file  Food Insecurity:    Worried About Programme researcher, broadcasting/film/video in the Last Year: Not on file   [employer] of Food in the  Last Year: Not on file  Transportation Needs:    Lack of Transportation (Medical): Not on file   Lack of Transportation (Non-Medical): Not on file  Physical Activity:    Days of Exercise per Week: Not on file   Minutes of Exercise per Session: Not on file  Stress:    Feeling of Stress : Not on file  Social Connections:    Frequency of Communication with Friends and Family: Not on file   Frequency of Social Gatherings with Friends and Family: Not on file   Attends Religious Services: Not on file   Active Member of Clubs or Organizations: Not on file   Attends Banker Meetings: Not on file   Marital Status: Not on file   Vitals:   12/30/19 1433  BP: 120/70  Pulse: 100  Resp: 16  SpO2: 98%   Body mass index is 31.64 kg/m.  Physical Exam Vitals and nursing note reviewed.  Constitutional:      General: She is not in acute distress.    Appearance: She is well-developed.  HENT:     Head: Normocephalic and atraumatic.  Eyes:     Conjunctiva/sclera: Conjunctivae normal.     Pupils: Pupils are equal, round, and reactive to light.  Neck:     Thyroid: No thyromegaly.  Cardiovascular:     Rate and Rhythm: Normal rate and regular rhythm.     Heart sounds: Murmur (Soft SEM RUSB) heard.   Pulmonary:     Effort: Pulmonary effort is normal. No respiratory distress.     Breath sounds: Normal breath sounds.  Abdominal:     Palpations: Abdomen is soft. There is no hepatomegaly or mass.     Tenderness: There is no abdominal tenderness.    Musculoskeletal:     Lumbar back: No tenderness or bony tenderness.  Lymphadenopathy:     Cervical: No cervical adenopathy.  Skin:    General: Skin is warm.     Findings: No erythema or rash.  Neurological:     Mental Status: She is alert and oriented to person, place, and time.     Cranial Nerves: No cranial nerve deficit.     Gait: Gait normal.  Psychiatric:     Comments: Well groomed, good eye contact.    ASSESSMENT AND PLAN:  Ms. Ashni was seen today for hot flashes.  Diagnoses and all orders for this visit:  Hot flashes We discussed other possible etiologies.For now I do not think we need further work up. Non hormonal treatments options: Paroxetine and Effexor. Hormonal therapy side effects reviewed.  She would like to try Effexor again. A healthful diet,decreasing alcohol intake,smoking cessation, and regular physical activity may also help.  She would lit me know if medication helps.  -     venlafaxine XR (EFFEXOR XR) 37.5 MG  24 hr capsule; Take 1 capsule (37.5 mg total) by mouth daily with breakfast.  Costal margin pain Resolved. Musculoskeletal most likely. Monitor for recurrence. F/U as needed.  TOBACCO ABUSE She understands adverse effects of tobacco use. She has quit smoking before without pharmacologic treatment. Encouraged smoking cessation.  Return if symptoms worsen or fail to improve.   Rhys Lichty G. [place], MD  [facility]. [place] office.   A few things to remember from today's visit:   Hot flashes - Plan: venlafaxine XR (EFFEXOR XR) 37.5 MG 24 hr capsule  If you need refills please call your pharmacy. Do not use My Chart to request refills or for acute issues that need immediate attention.   Let me know if Effexor is helping, otherwise we can try Paroxetine.  Please be sure medication list is accurate. If a new problem present, please set up appointment sooner than planned today.

## 2020-01-27 MED FILL — VENLAFAXINE HCL ER 37.5 MG: 37.5 | 30 days supply | Qty: 30 | Fill #1

## 2020-03-03 ENCOUNTER — Telehealth: Payer: Self-pay | Admitting: Family Medicine

## 2020-03-03 NOTE — Telephone Encounter (Signed)
Patient is calling and stated that she was prescribed something for hot flashes but it is not working and wanted to see if she can try something else, please advise. CB is [phone]

## 2020-03-10 NOTE — Telephone Encounter (Signed)
Stop Effexor. Paroxetine 10 mg daily is the other option, if she agrees please send Rx to her pharmacy for 30 tabs with 1 refills. Thanks, BJ

## 2020-03-11 ENCOUNTER — Other Ambulatory Visit: Payer: Self-pay | Admitting: Family Medicine

## 2020-03-11 MED ORDER — PAROXETINE HCL 10 MG PO TABS
10.0000 mg | ORAL_TABLET | Freq: Every day | ORAL | 1 refills | Status: DC
Start: 2020-03-11 — End: 2020-06-01

## 2020-03-11 MED FILL — PARoxetine HCL 10 MG TABS: 10 | 30 days supply | Qty: 30 | Fill #0

## 2020-03-11 NOTE — Telephone Encounter (Signed)
Left a message for the pt to return a call to the office.   

## 2020-03-11 NOTE — Telephone Encounter (Signed)
Patient informed of the message below, agreed to begin Paxil and is aware the Rx was sent to [facility] pharmacy.

## 2020-03-25 MED FILL — VALACYCLOVIR HCL 500 MG TAB: 500 | 90 days supply | Qty: 90 | Fill #1

## 2020-05-11 ENCOUNTER — Ambulatory Visit: Payer: No Typology Code available for payment source | Admitting: Orthopaedic Surgery

## 2020-05-16 ENCOUNTER — Ambulatory Visit: Payer: No Typology Code available for payment source | Admitting: Physician Assistant

## 2020-05-16 ENCOUNTER — Telehealth: Payer: Self-pay | Admitting: Family Medicine

## 2020-05-16 DIAGNOSIS — M25569 Pain in unspecified knee: Secondary | ICD-10-CM

## 2020-05-16 NOTE — Telephone Encounter (Signed)
Patient is calling and wanted to see if the provider can put in a referral to see an orthopedic specialist for knee pain and swelling. Pt stated that she has an appointment today and requested to see Kathleen Vang at [facility], please advise. CB is [phone]

## 2020-05-16 NOTE — Telephone Encounter (Signed)
Referral is placed.

## 2020-05-31 ENCOUNTER — Ambulatory Visit (INDEPENDENT_AMBULATORY_CARE_PROVIDER_SITE_OTHER): Payer: No Typology Code available for payment source

## 2020-05-31 ENCOUNTER — Ambulatory Visit (INDEPENDENT_AMBULATORY_CARE_PROVIDER_SITE_OTHER): Payer: No Typology Code available for payment source | Admitting: Orthopaedic Surgery

## 2020-05-31 VITALS — Ht 62.0 in | Wt 179.0 lb

## 2020-05-31 DIAGNOSIS — M25561 Pain in right knee: Secondary | ICD-10-CM | POA: Diagnosis not present

## 2020-05-31 DIAGNOSIS — G8929 Other chronic pain: Secondary | ICD-10-CM | POA: Diagnosis not present

## 2020-05-31 NOTE — Progress Notes (Signed)
Office Visit Note   Patient: Kathleen Vang           Date of Birth: [birth year]           MRN: [id number] Visit Date: 05/31/2020              Requested by: [place], Betty G, MD [address] [place],  [place] [address] PCP: [place], Betty G, MD   Assessment & Plan: Visit Diagnoses:  1. Chronic pain of right knee     Plan: Impression is right knee arthritis exacerbation.  Aspiration injection performed today.  8 cc of joint fluid aspirated.  Cortisone injected.  Kathleen Vang will follow-up if there is no improvement in symptoms.  Follow-Up Instructions: Return if symptoms worsen or fail to improve.   Orders:  Orders Placed This Encounter  Procedures  . XR KNEE 3 VIEW RIGHT   No orders of the defined types were placed in this encounter.     Procedures: No procedures performed   Clinical Data: No additional findings.   Subjective: Chief Complaint  Patient presents with  . Right Knee - Pain    Kathleen Vang is coming in for evaluation of chronic right knee pain for the last few months.  I saw her about 2 and half years ago for similar problem with her left knee which responded really well to cortisone injection.  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  She feels like there is some residual swelling in the right knee.  She felt pain in the back of the knee a few weeks ago.  Denies any mechanical symptoms.  She does have some popping.  The knee feels tight when she flexes past 90 degrees.  Voltaren gel and Advil did not help significantly.   Review of Systems  Constitutional: Negative.   HENT: Negative.   Eyes: Negative.   Respiratory: Negative.   Cardiovascular: Negative.   Endocrine: Negative.   Musculoskeletal: Negative.   Neurological: Negative.   Hematological: Negative.   Psychiatric/Behavioral: Negative.   All other systems reviewed and are negative.    Objective: Vital Signs: Ht 5\' 2"  (1.575 m)   Wt 179 lb (81.2 kg)   BMI 32.74 kg/m   Physical Exam Vitals  and nursing note reviewed.  Constitutional:      Appearance: She is well-developed.  Pulmonary:     Effort: Pulmonary effort is normal.  Skin:    General: Skin is warm.     Capillary Refill: Capillary refill takes less than 2 seconds.  Neurological:     Mental Status: She is alert and oriented to person, place, and time.  Psychiatric:        Behavior: Behavior normal.        Thought Content: Thought content normal.        Judgment: Judgment normal.     Ortho Exam Right knee shows a small effusion.  Because her cruciates are stable.  No joint line tenderness.  Slight decreased range of motion secondary to tightness and discomfort. Specialty Comments:  No specialty comments available.  Imaging: XR KNEE 3 VIEW RIGHT  Result Date: 05/31/2020 Mild arthritis.  No degenerative changes.    PMFS History: Patient Active Problem List   Diagnosis Date Noted  . Class 1 obesity with body mass index (BMI) of 30.0 to 30.9 in adult 07/07/2019  . GOITER, UNSPECIFIED 04/28/2007  . DIZZINESS 04/28/2007  . TONSILLAR HYPERTROPHY, UNILATERAL 08/27/2006  . SARCOIDOSIS 04/03/2006  . ANEMIA NOS 04/03/2006  . TOBACCO  ABUSE 04/03/2006  . HEADACHE, TENSION 04/03/2006  . DISORDER, DEPRESSIVE NEC 04/03/2006  . HIATAL HERNIA WITH REFLUX 04/03/2006  . EPICONDYLITIS, LATERAL 04/03/2006  . INSOMNIA 04/03/2006  . HELICOBACTER PYLORI GASTRITIS, HX OF 04/03/2006   Past Medical History:  Diagnosis Date  . Anemia   . Arthritis   . Complication of anesthesia   . Herpes genitalis in women 2010  . MRSA infection greater than 3 months ago 2009  . PONV (postoperative nausea and vomiting)   . Sarcoid 1993   in remission per pt    Family History  Problem Relation Age of Onset  . Other Mother   . Colon cancer Neg Hx   . Colon polyps Neg Hx   . Esophageal cancer Neg Hx   . Rectal cancer Neg Hx   . Stomach cancer Neg Hx     Past Surgical History:  Procedure Laterality Date  . CALCANEAL OSTEOTOMY  Left 07/30/2019   Procedure: CALCANEAL OSTEOTOMY;  Surgeon: Toni Arthurs, MD;  Location: [facility];  Service: Orthopedics;  Laterality: Left;  Marland Kitchen GASTROC RECESSION EXTREMITY Left 07/30/2019   Procedure: LEFT GASTROC RECESSION;  Surgeon: Toni Arthurs, MD;  Location: [facility];  Service: Orthopedics;  Laterality: Left;  . LYMPH NODE BIOPSY  1993  . POSTERIOR TIBIAL TENDON REPAIR Left 07/30/2019   Procedure: EXCISION  LEFT ACCESSSORY NAVICULAR, POSTERIOR TIBIAL TENDON RECONSTRUCTION;  Surgeon: Toni Arthurs, MD;  Location: [facility];  Service: Orthopedics;  Laterality: Left;  . TUBAL LIGATION  1988   Social History   Occupational History  . Not on file  Tobacco Use  . Smoking status: Current Every Day Smoker    Packs/day: 0.25    Types: Cigarettes    Last attempt to quit: 08/04/2018    Years since quitting: 1.8  . Smokeless tobacco: Never Used  . Tobacco comment: 6 cig/day  Vaping Use  . Vaping Use: Every day  . Substances: Nicotine, Flavoring  Substance and Sexual Activity  . Alcohol use: Yes    Alcohol/week: 6.0 standard drinks    Types: 6 Cans of beer per week    Comment: 1-2 beer   . Drug use: No  . Sexual activity: Yes    Birth control/protection: None, Surgical

## 2020-06-01 ENCOUNTER — Encounter (INDEPENDENT_AMBULATORY_CARE_PROVIDER_SITE_OTHER): Payer: Self-pay | Admitting: Family Medicine

## 2020-06-01 ENCOUNTER — Ambulatory Visit (INDEPENDENT_AMBULATORY_CARE_PROVIDER_SITE_OTHER): Payer: No Typology Code available for payment source | Admitting: Family Medicine

## 2020-06-01 ENCOUNTER — Other Ambulatory Visit: Payer: Self-pay

## 2020-06-01 VITALS — BP 130/90 | HR 87 | Temp 98.4°F | Ht 62.0 in | Wt 170.0 lb

## 2020-06-01 DIAGNOSIS — Z1331 Encounter for screening for depression: Secondary | ICD-10-CM | POA: Diagnosis not present

## 2020-06-01 DIAGNOSIS — R0602 Shortness of breath: Secondary | ICD-10-CM

## 2020-06-01 DIAGNOSIS — E739 Lactose intolerance, unspecified: Secondary | ICD-10-CM | POA: Insufficient documentation

## 2020-06-01 DIAGNOSIS — Z6830 Body mass index (BMI) 30.0-30.9, adult: Secondary | ICD-10-CM

## 2020-06-01 DIAGNOSIS — Z9189 Other specified personal risk factors, not elsewhere classified: Secondary | ICD-10-CM | POA: Diagnosis not present

## 2020-06-01 DIAGNOSIS — R5383 Other fatigue: Secondary | ICD-10-CM | POA: Insufficient documentation

## 2020-06-01 DIAGNOSIS — E669 Obesity, unspecified: Secondary | ICD-10-CM

## 2020-06-01 DIAGNOSIS — Z0289 Encounter for other administrative examinations: Secondary | ICD-10-CM

## 2020-06-01 DIAGNOSIS — D869 Sarcoidosis, unspecified: Secondary | ICD-10-CM

## 2020-06-01 DIAGNOSIS — R030 Elevated blood-pressure reading, without diagnosis of hypertension: Secondary | ICD-10-CM | POA: Insufficient documentation

## 2020-06-01 DIAGNOSIS — D649 Anemia, unspecified: Secondary | ICD-10-CM | POA: Diagnosis not present

## 2020-06-02 LAB — CBC WITH DIFFERENTIAL/PLATELET
Basophils Absolute: 0 10*3/uL (ref 0.0–0.2)
Basos: 0 %
EOS (ABSOLUTE): 0 10*3/uL (ref 0.0–0.4)
Eos: 0 %
Hematocrit: 44.2 % (ref 34.0–46.6)
Hemoglobin: 15.4 g/dL (ref 11.1–15.9)
Immature Grans (Abs): 0.1 10*3/uL (ref 0.0–0.1)
Immature Granulocytes: 1 %
Lymphocytes Absolute: 1.2 10*3/uL (ref 0.7–3.1)
Lymphs: 9 %
MCH: 33.3 pg — ABNORMAL HIGH (ref 26.6–33.0)
MCHC: 34.8 g/dL (ref 31.5–35.7)
MCV: 96 fL (ref 79–97)
Monocytes Absolute: 0.7 10*3/uL (ref 0.1–0.9)
Monocytes: 5 %
Neutrophils Absolute: 11.3 10*3/uL — ABNORMAL HIGH (ref 1.4–7.0)
Neutrophils: 85 %
Platelets: 314 10*3/uL (ref 150–450)
RBC: 4.63 x10E6/uL (ref 3.77–5.28)
RDW: 11.6 % — ABNORMAL LOW (ref 11.7–15.4)
WBC: 13.4 10*3/uL — ABNORMAL HIGH (ref 3.4–10.8)

## 2020-06-02 LAB — LIPID PANEL
Chol/HDL Ratio: 2.9 ratio (ref 0.0–4.4)
Cholesterol, Total: 184 mg/dL (ref 100–199)
HDL: 64 mg/dL (ref >39)
LDL Chol Calc (NIH): 103 mg/dL — ABNORMAL HIGH (ref 0–99)
Triglycerides: 94 mg/dL (ref 0–149)
VLDL Cholesterol Cal: 17 mg/dL (ref 5–40)

## 2020-06-02 LAB — COMPREHENSIVE METABOLIC PANEL
ALT: 13 IU/L (ref 0–32)
AST: 27 IU/L (ref 0–40)
Albumin/Globulin Ratio: 1.6 (ref 1.2–2.2)
Albumin: 4.4 g/dL (ref 3.8–4.9)
Alkaline Phosphatase: 73 IU/L (ref 44–121)
BUN/Creatinine Ratio: 16 (ref 9–23)
BUN: 10 mg/dL (ref 6–24)
Bilirubin Total: 0.4 mg/dL (ref 0.0–1.2)
CO2: 16 mmol/L — ABNORMAL LOW (ref 20–29)
Calcium: 9.8 mg/dL (ref 8.7–10.2)
Chloride: 103 mmol/L (ref 96–106)
Creatinine, Ser: 0.61 mg/dL (ref 0.57–1.00)
Globulin, Total: 2.8 g/dL (ref 1.5–4.5)
Glucose: 99 mg/dL (ref 65–99)
Potassium: 4.4 mmol/L (ref 3.5–5.2)
Sodium: 140 mmol/L (ref 134–144)
Total Protein: 7.2 g/dL (ref 6.0–8.5)
eGFR: 108 mL/min/{1.73_m2} (ref >59)

## 2020-06-02 LAB — VITAMIN D 25 HYDROXY (VIT D DEFICIENCY, FRACTURES): Vit D, 25-Hydroxy: 40.3 ng/mL (ref 30.0–100.0)

## 2020-06-02 LAB — HEMOGLOBIN A1C
Est. average glucose Bld gHb Est-mCnc: 100 mg/dL
Hgb A1c MFr Bld: 5.1 % (ref 4.8–5.6)

## 2020-06-02 LAB — TSH: TSH: 0.44 u[IU]/mL — ABNORMAL LOW (ref 0.450–4.500)

## 2020-06-02 LAB — INSULIN, RANDOM: INSULIN: 14 u[IU]/mL (ref 2.6–24.9)

## 2020-06-02 LAB — T4, FREE: Free T4: 0.96 ng/dL (ref 0.82–1.77)

## 2020-06-02 LAB — VITAMIN B12: Vitamin B-12: 935 pg/mL (ref 232–1245)

## 2020-06-02 LAB — FOLATE: Folate: 16.9 ng/mL (ref >3.0)

## 2020-06-09 NOTE — Progress Notes (Signed)
Chief Complaint:   OBESITY Kathleen Vang (MR# [id number]) is a 50 y.o. female who presents for evaluation and treatment of obesity and related comorbidities. Current BMI is Body mass index is 31.09 kg/m. Kathleen Vang has been struggling with her weight for many years and has been unsuccessful in either losing weight, maintaining weight loss, or reaching her healthy weight goal.  Kathleen Vang is currently in the action stage of change and ready to dedicate time achieving and maintaining a healthier weight. Kathleen Vang is interested in becoming our patient and working on intensive lifestyle modifications including (but not limited to) diet and exercise for weight loss.  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  Her significant other, Kathleen Vang, lives with her.  She would like to lose 25-35 pounds in 6 months.  She eats out 2-3 times per week.  Craves [place], steak and cheese, and pizza, especially in the evenings.  No snacking.  Drinks 3-4 cans of beer 3-4 days per week.  Also drinks tea, juice, and coffee with creamer/sugar.  Kateleen's habits were reviewed today and are as follows: Her family eats meals together, she thinks her family will eat healthier with her, her desired weight loss is 25-35 pounds, she started gaining excessive weight about 4 years ago, her heaviest weight ever was 189 pounds, she is a picky eater and doesn't like to eat healthier foods, she craves [place] food, steak and cheese, salads, and pizza, she is frequently drinking liquids with calories and she frequently eats larger portions than normal.  Depression Screen Chauna's Food and Mood (modified PHQ-9) score was 0.  Depression screen [facility] 06/01/2020  Decreased Interest 0  Down, Depressed, Hopeless 0  PHQ - 2 Score 0  Altered sleeping 0  Tired, decreased energy 0  Change in appetite 0  Feeling bad or failure about yourself  0  Trouble concentrating 0  Moving slowly or fidgety/restless 0  Suicidal thoughts 0  PHQ-9  Score 0  Difficult doing work/chores Not difficult at all   Assessment/Plan:   Orders Placed This Encounter  Procedures  . Vitamin B12  . CBC with Differential/Platelet  . Comprehensive metabolic panel  . Folate  . Hemoglobin A1c  . Insulin, random  . Lipid panel  . T4, free  . TSH  . VITAMIN D 25 Hydroxy (Vit-D Deficiency, Fractures)  . EKG 12-Lead    Medications Discontinued During This Encounter  Medication Reason  . aspirin EC 81 MG tablet   . diphenhydrAMINE HCl, Sleep, (UNISOM SLEEPGELS PO)   . ondansetron (ZOFRAN) 4 MG tablet   . PARoxetine (PAXIL) 10 MG tablet   . venlafaxine XR (EFFEXOR XR) 37.5 MG 24 hr capsule    1. Other fatigue Kathleen Vang denies daytime somnolence and denies waking up still tired. Patent has a history of symptoms of snoring. Kathleen Vang generally gets 5 or 6 hours of sleep per night, and states that she has generally restful sleep. Snoring is present. Apneic episodes are not present. Epworth Sleepiness Score is 4, which is within normal limits.  Kathleen Vang does feel that her weight is causing her energy to be lower than it should be. Fatigue may be related to obesity, depression or many other causes. Labs will be ordered, and in the meanwhile, Kathleen Vang will focus on self care including making healthy food choices, increasing physical activity and focusing on stress reduction.  Will check EKG and labs today.  - EKG 12-Lead - Vitamin B12 - CBC with Differential/Platelet - Comprehensive  metabolic panel - Folate - Hemoglobin A1c - Insulin, random - Lipid panel - T4, free - TSH - VITAMIN D 25 Hydroxy (Vit-D Deficiency, Fractures)  2. SOBOE (shortness of breath on exertion) Kathleen Reining notes increasing shortness of breath with exercising and seems to be worsening over time with weight gain. She notes getting out of breath sooner with activity than she used to. This has gotten worse recently. Kitty denies shortness of breath at rest or orthopnea.  Shenika does feel  that she gets out of breath more easily that she used to when she exercises. Dustina's shortness of breath appears to be obesity related and exercise induced. She has agreed to work on weight loss and gradually increase exercise to treat her exercise induced shortness of breath. Will continue to monitor closely.  Will check IC and labs today.  - Vitamin B12 - CBC with Differential/Platelet - Comprehensive metabolic panel - Folate - Hemoglobin A1c - Insulin, random - Lipid panel - T4, free - TSH - VITAMIN D 25 Hydroxy (Vit-D Deficiency, Fractures)  3. Sarcoidosis- in remission In remission for 30+ years now.  Plan:  This is not an active issue currently.  Will monitor.  Will check labs today.  - Vitamin B12 - CBC with Differential/Platelet - Comprehensive metabolic panel - Folate - Hemoglobin A1c - Insulin, random - Lipid panel - T4, free - TSH - VITAMIN D 25 Hydroxy (Vit-D Deficiency, Fractures)  4. Anemia, unspecified type Lun is taking a daily multivitamin.  Plan:  Conitnue multivitamin and we will check labs today, as per below.    Nutrition: Iron-rich foods include dark leafy greens, red and white meats, eggs, seafood, and beans.  Certain foods and drinks prevent your body from absorbing iron properly. Avoid eating these foods in the same meal as iron-rich foods or with iron supplements. These foods include: coffee, black tea, and red wine; milk, dairy products, and foods that are high in calcium; beans and soybeans; whole grains. Constipation can be a side effect of iron supplementation. Increased water and fiber intake are helpful. Water goal: > 2 liters/day. Fiber goal: > 25 grams/day.  CBC Latest Ref Rng & Units 10/02/2018 04/28/2007  WBC 3.4 - 10.8 x10E3/uL 5.6 5.7  Hemoglobin 11.1 - 15.9 g/dL 70.6 11.2(L)  Hematocrit 34.0 - 46.6 % 41.4 36.0  Platelets 150 - 450 x10E3/uL 331 491(H)   - Vitamin B12 - CBC with Differential/Platelet - Folate  5. Lactose  intolerance Allessandra is intolerant to foods containing lactose.  Plan:  We discussed lactose-free options for yogurt, cheese, and milk, etc.    6. Elevated blood pressure reading Cici says she was told by a PCP (Dr. Duanne Guess) in the past that she needed blood pressure medication.  She never took them as she says that her blood pressure was elevated that day due to agitation.  Plan:  Will monitor closely every 2 weeks.  Follow prudent nutritional plan, weight loss, and eventually exercise.  BP Readings from Last 3 Encounters:  06/01/20 130/90  12/30/19 120/70  07/30/19 (!) 147/73   7. Depression screening Shaleigh was screened for depression as part of her new patient workup.  PHQ-9 is 0.  8. At risk for impaired metabolic function Due to Nzinga's current state of health and medical condition(s), she is at a significantly higher risk for impaired metabolic function.   At least 10 minutes was spent on counseling Shawneequa about these concerns today.  This places the patient at a much greater risk to subsequently  develop cardio-pulmonary conditions that can negatively affect the patient's quality of life.  I stressed the importance of reversing these risks factors.  The initial goal is to lose at least 5-10% of starting weight to help reduce risk factors.  Counseling:  Intensive lifestyle modifications discussed with Aquarius as the most appropriate first line treatment.  she will continue to work on diet, exercise, and weight loss efforts.  We will continue to reassess these conditions on a fairly regular basis in an attempt to decrease the patient's overall morbidity and mortality.  9. Class 1 obesity with serious comorbidity and body mass index (BMI) of 30.0 to 30.9 in adult, unspecified obesity type  Delanie is currently in the action stage of change and her goal is to continue with weight loss efforts. I recommend Addyson begin the structured treatment plan as follows:  She has agreed to the Category  3 Plan.  Exercise goals: As is.   Behavioral modification strategies: increasing lean protein intake, decreasing simple carbohydrates, decreasing liquid calories, no skipping meals, meal planning and cooking strategies, keeping healthy foods in the home and planning for success.  She was informed of the importance of frequent follow-up visits to maximize her success with intensive lifestyle modifications for her multiple health conditions. She was informed we would discuss her lab results at her next visit unless there is a critical issue that needs to be addressed sooner. Federica agreed to keep her next visit at the agreed upon time to discuss these results.  Objective:   Blood pressure 130/90, pulse 87, temperature 98.4 F (36.9 C), height 5\' 2"  (1.575 m), weight 170 lb (77.1 kg), SpO2 97 %. Body mass index is 31.09 kg/m.  EKG: Normal sinus rhythm, rate 83 bpm.  Indirect Calorimeter completed today shows a VO2 of 316 and a REE of 2201.  Her calculated basal metabolic rate is 2202 thus her basal metabolic rate is better than expected.  General: Cooperative, alert, well developed, in no acute distress. HEENT: Conjunctivae and lids unremarkable. Cardiovascular: Regular rhythm.  Lungs: Normal work of breathing. Neurologic: No focal deficits.   Attestation Statements:   This is the patient's first visit at [facility]. The patient's NEW PATIENT PACKET was reviewed at length. Included in the packet: current and past health history, medications, allergies, ROS, gynecologic history (women only), surgical history, family history, social history, weight history, weight loss surgery history (for those that have had weight loss surgery), nutritional evaluation, mood and food questionnaire, PHQ9, Epworth questionnaire, sleep habits questionnaire, patient life and health improvement goals questionnaire. These will all be scanned into the patient's chart under media.   During the visit,  I independently reviewed the patient's EKG, bioimpedance scale results, and indirect calorimeter results. I used this information to tailor a meal plan for the patient that will help her to lose weight and will improve her obesity-related conditions going forward. I performed a medically necessary appropriate examination and/or evaluation. I discussed the assessment and treatment plan with the patient. The patient was provided an opportunity to ask questions and all were answered. The patient agreed with the plan and demonstrated an understanding of the instructions. Labs were ordered at this visit and will be reviewed at the next visit unless more critical results need to be addressed immediately. Clinical information was updated and documented in the EMR.   I, [id number], CMA, am acting as Insurance claims handler for Energy manager, DO.  I have reviewed the above documentation for accuracy and completeness, and  I agree with the above. Carlye Grippe, D.O.  The 21st Century Cures Act was signed into law in 2016 which includes the topic of electronic health records.  This provides immediate access to information in MyChart.  This includes consultation notes, operative notes, office notes, lab results and pathology reports.  If you have any questions about what you read please let us know at your next visit so we can discuss your concerns and take corrective action if need be.  We are right here with you.

## 2020-06-15 ENCOUNTER — Ambulatory Visit (INDEPENDENT_AMBULATORY_CARE_PROVIDER_SITE_OTHER): Payer: Self-pay | Admitting: Family Medicine

## 2020-06-21 ENCOUNTER — Other Ambulatory Visit (HOSPITAL_COMMUNITY): Payer: Self-pay

## 2020-06-21 MED FILL — Valacyclovir HCl Tab 500 MG: ORAL | 90 days supply | Qty: 90 | Fill #0 | Status: AC

## 2020-06-23 ENCOUNTER — Ambulatory Visit (INDEPENDENT_AMBULATORY_CARE_PROVIDER_SITE_OTHER): Payer: No Typology Code available for payment source | Admitting: Bariatrics

## 2020-06-23 ENCOUNTER — Encounter (INDEPENDENT_AMBULATORY_CARE_PROVIDER_SITE_OTHER): Payer: Self-pay | Admitting: Bariatrics

## 2020-06-23 ENCOUNTER — Other Ambulatory Visit: Payer: Self-pay

## 2020-06-23 VITALS — BP 134/84 | HR 85 | Temp 98.4°F | Ht 62.0 in | Wt 164.0 lb

## 2020-06-23 DIAGNOSIS — E8881 Metabolic syndrome: Secondary | ICD-10-CM

## 2020-06-23 DIAGNOSIS — Z9189 Other specified personal risk factors, not elsewhere classified: Secondary | ICD-10-CM

## 2020-06-23 DIAGNOSIS — R7989 Other specified abnormal findings of blood chemistry: Secondary | ICD-10-CM

## 2020-06-23 DIAGNOSIS — Z6830 Body mass index (BMI) 30.0-30.9, adult: Secondary | ICD-10-CM

## 2020-06-23 DIAGNOSIS — E669 Obesity, unspecified: Secondary | ICD-10-CM

## 2020-06-27 NOTE — Progress Notes (Signed)
Chief Complaint:   OBESITY Kathleen Vang is here to discuss her progress with her obesity treatment plan along with follow-up of her obesity related diagnoses. Kathleen Vang is on the Category 3 Plan and states she is following her eating plan approximately 85% of the time. Kathleen Vang states she is walking for 30 minutes 7 times per week.  Today's visit was #: 2 Starting weight: 170 lbs Starting date: 06/01/2020 Today's weight: 164 lbs Today's date: 06/23/2020 Total lbs lost to date: 6 lbs Total lbs lost since last in-office visit: 6 lbs  Interim History: Kathleen Vang is down 6 pounds.  She struggled with the eggs in the morning.  She struggled with the meat in the evening.  Subjective:   1. Insulin resistance Arthur has a diagnosis of insulin resistance based on her elevated fasting insulin level >5. She continues to work on diet and exercise to decrease her risk of diabetes.  Insulin 14.0, A1c 5.1.  Lab Results  Component Value Date   INSULIN 14.0 06/01/2020   Lab Results  Component Value Date   HGBA1C 5.1 06/01/2020   2. Low TSH level She was on no medications.  No thyroid diagnosis.  Lab Results  Component Value Date   TSH 0.440 (L) 06/01/2020   3. At risk for diabetes mellitus Kathleen Vang is at higher than average risk for developing diabetes due to obesity.   Assessment/Plan:   1. Insulin resistance Kathleen Vang will continue to work on weight loss, exercise, and decreasing simple carbohydrates to help decrease the risk of diabetes. Kathleen Vang agreed to follow-up with [place] as directed to closely monitor her progress.  Decrease carbohydrates and increase healthy fats and protein.  2. Low TSH level Will follow over time.  3. At risk for diabetes mellitus Kathleen Vang was given approximately 15 minutes of diabetes education and counseling today. We discussed intensive lifestyle modifications today with an emphasis on weight loss as well as increasing exercise and decreasing simple carbohydrates in her diet.  We also reviewed medication options with an emphasis on risk versus benefit of those discussed.   Repetitive spaced learning was employed today to elicit superior memory formation and behavioral change.  4. Obesity, current BMI 30  Kathleen Vang is currently in the action stage of change. As such, her goal is to continue with weight loss efforts. She has agreed to the Category 3 Plan.   She will work on meal planning and will remain adherent to the plan.  Labs from 06/01/2020, including CMP, lipid panel, vitamin D, vitamin B12, CBC, A1c, insulin, and thyroid panel, were reviewed with the patient today.  Exercise goals: No exercise has been prescribed at this time.  Behavioral modification strategies: increasing lean protein intake, decreasing simple carbohydrates, increasing vegetables, increasing water intake, decreasing eating out, no skipping meals, meal planning and cooking strategies, keeping healthy foods in the home and planning for success.  Kathleen Vang has agreed to follow-up with our clinic in 2 weeks. She was informed of the importance of frequent follow-up visits to maximize her success with intensive lifestyle modifications for her multiple health conditions.   Objective:   Blood pressure 134/84, pulse 85, temperature 98.4 F (36.9 C), height 5\' 2"  (1.575 m), weight 164 lb (74.4 kg), SpO2 97 %. Body mass index is 30 kg/m.  General: Cooperative, alert, well developed, in no acute distress. HEENT: Conjunctivae and lids unremarkable. Cardiovascular: Regular rhythm.  Lungs: Normal work of breathing. Neurologic: No focal deficits.   Lab Results  Component Value Date  CREATININE 0.61 06/01/2020   BUN 10 06/01/2020   NA 140 06/01/2020   K 4.4 06/01/2020   CL 103 06/01/2020   CO2 16 (L) 06/01/2020   Lab Results  Component Value Date   ALT 13 06/01/2020   AST 27 06/01/2020   ALKPHOS 73 06/01/2020   BILITOT 0.4 06/01/2020   Lab Results  Component Value Date   HGBA1C 5.1  06/01/2020   Lab Results  Component Value Date   INSULIN 14.0 06/01/2020   Lab Results  Component Value Date   TSH 0.440 (L) 06/01/2020   Lab Results  Component Value Date   CHOL 184 06/01/2020   HDL 64 06/01/2020   LDLCALC 103 (H) 06/01/2020   TRIG 94 06/01/2020   CHOLHDL 2.9 06/01/2020   Lab Results  Component Value Date   WBC 13.4 (H) 06/01/2020   HGB 15.4 06/01/2020   HCT 44.2 06/01/2020   MCV 96 06/01/2020   PLT 314 06/01/2020   Attestation Statements:   Reviewed by clinician on day of visit: allergies, medications, problem list, medical history, surgical history, family history, social history, and previous encounter notes.  I, Insurance claims handler, CMA, am acting as Energy manager for [employer], DO  I have reviewed the above documentation for accuracy and completeness, and I agree with the above. Corinna Capra, DO

## 2020-07-11 ENCOUNTER — Other Ambulatory Visit: Payer: Self-pay

## 2020-07-11 ENCOUNTER — Encounter (INDEPENDENT_AMBULATORY_CARE_PROVIDER_SITE_OTHER): Payer: Self-pay | Admitting: Bariatrics

## 2020-07-11 ENCOUNTER — Ambulatory Visit (INDEPENDENT_AMBULATORY_CARE_PROVIDER_SITE_OTHER): Payer: No Typology Code available for payment source | Admitting: Bariatrics

## 2020-07-11 VITALS — BP 139/84 | HR 82 | Temp 98.0°F | Ht 62.0 in | Wt 164.0 lb

## 2020-07-11 DIAGNOSIS — E669 Obesity, unspecified: Secondary | ICD-10-CM

## 2020-07-11 DIAGNOSIS — E8881 Metabolic syndrome: Secondary | ICD-10-CM | POA: Diagnosis not present

## 2020-07-11 DIAGNOSIS — Z6830 Body mass index (BMI) 30.0-30.9, adult: Secondary | ICD-10-CM | POA: Diagnosis not present

## 2020-07-12 NOTE — Progress Notes (Signed)
Chief Complaint:   OBESITY Kathleen Vang is here to discuss her progress with her obesity treatment plan along with follow-up of her obesity related diagnoses. Kathleen Vang is on the Category 3 Plan and states she is following her eating plan approximately 97% of the time. Kathleen Vang states she is walking 60-120 minutes 7 times per week.  Today's visit was #: 3 Starting weight: 170 lbs Starting date: 06/01/2020 Today's weight: 164 lbs Today's date: 07/11/2020 Total lbs lost to date: 6 Total lbs lost since last in-office visit: 0  Interim History: Kathleen Vang's weight remains the dame. Her fat percentage is going down. She is getting adequate protein.  Subjective:   1. Insulin resistance [place] is not on medication. Her A1c is 5.1 and insulin level 14.0.  Lab Results  Component Value Date   INSULIN 14.0 06/01/2020   Lab Results  Component Value Date   HGBA1C 5.1 06/01/2020    Assessment/Plan:   1. Insulin resistance Kathleen Vang will continue to work on weight loss, exercise, and decreasing simple carbohydrates to help decrease the risk of diabetes. Kathleen Vang agreed to follow-up with [place] as directed to closely monitor her progress. Increase healthy fats and protein.  2. Obesity, current BMI 30  Kathleen Vang is currently in the action stage of change. As such, her goal is to continue with weight loss efforts. She has agreed to the Category 3 Plan.   Meal plan Intentional eating Will weigh protein  Exercise goals: Will begin resistance training and continue walking.  Behavioral modification strategies: increasing lean protein intake, decreasing simple carbohydrates, increasing vegetables, increasing water intake, decreasing eating out, no skipping meals, meal planning and cooking strategies, keeping healthy foods in the home, avoiding temptations and planning for success.  Kathleen Vang has agreed to follow-up with our clinic in 2 weeks. She was informed of the importance of frequent follow-up visits to maximize  her success with intensive lifestyle modifications for her multiple health conditions.   Objective:   Blood pressure 139/84, pulse 82, temperature 98 F (36.7 C), height 5\' 2"  (1.575 m), weight 164 lb (74.4 kg), SpO2 97 %. Body mass index is 30 kg/m.  General: Cooperative, alert, well developed, in no acute distress. HEENT: Conjunctivae and lids unremarkable. Cardiovascular: Regular rhythm.  Lungs: Normal work of breathing. Neurologic: No focal deficits.   Lab Results  Component Value Date   CREATININE 0.61 06/01/2020   BUN 10 06/01/2020   NA 140 06/01/2020   K 4.4 06/01/2020   CL 103 06/01/2020   CO2 16 (L) 06/01/2020   Lab Results  Component Value Date   ALT 13 06/01/2020   AST 27 06/01/2020   ALKPHOS 73 06/01/2020   BILITOT 0.4 06/01/2020   Lab Results  Component Value Date   HGBA1C 5.1 06/01/2020   Lab Results  Component Value Date   INSULIN 14.0 06/01/2020   Lab Results  Component Value Date   TSH 0.440 (L) 06/01/2020   Lab Results  Component Value Date   CHOL 184 06/01/2020   HDL 64 06/01/2020   LDLCALC 103 (H) 06/01/2020   TRIG 94 06/01/2020   CHOLHDL 2.9 06/01/2020   Lab Results  Component Value Date   WBC 13.4 (H) 06/01/2020   HGB 15.4 06/01/2020   HCT 44.2 06/01/2020   MCV 96 06/01/2020   PLT 314 06/01/2020   No results found for: IRON, TIBC, FERRITIN   Attestation Statements:   Reviewed by clinician on day of visit: allergies, medications, problem list, medical history, surgical history,  family history, social history, and previous encounter notes.  Time spent on visit including pre-visit chart review and post-visit care and charting was 20 minutes.   Edmund Hilda, CMA, am acting as Energy manager for [employer], DO.  I have reviewed the above documentation for accuracy and completeness, and I agree with the above. Corinna Capra, DO

## 2020-07-13 ENCOUNTER — Encounter (INDEPENDENT_AMBULATORY_CARE_PROVIDER_SITE_OTHER): Payer: Self-pay | Admitting: Bariatrics

## 2020-07-25 ENCOUNTER — Other Ambulatory Visit: Payer: Self-pay

## 2020-07-25 ENCOUNTER — Ambulatory Visit (INDEPENDENT_AMBULATORY_CARE_PROVIDER_SITE_OTHER): Payer: No Typology Code available for payment source | Admitting: Bariatrics

## 2020-07-25 ENCOUNTER — Encounter (INDEPENDENT_AMBULATORY_CARE_PROVIDER_SITE_OTHER): Payer: Self-pay | Admitting: Bariatrics

## 2020-07-25 VITALS — BP 134/81 | HR 100 | Temp 98.6°F | Ht 62.0 in | Wt 164.0 lb

## 2020-07-25 DIAGNOSIS — E8881 Metabolic syndrome: Secondary | ICD-10-CM

## 2020-07-25 DIAGNOSIS — Z6830 Body mass index (BMI) 30.0-30.9, adult: Secondary | ICD-10-CM | POA: Diagnosis not present

## 2020-07-25 DIAGNOSIS — R7989 Other specified abnormal findings of blood chemistry: Secondary | ICD-10-CM

## 2020-07-25 DIAGNOSIS — E669 Obesity, unspecified: Secondary | ICD-10-CM

## 2020-07-26 ENCOUNTER — Other Ambulatory Visit: Payer: Self-pay

## 2020-07-26 NOTE — Progress Notes (Signed)
HPI: Kathleen Vang is a 50 y.o. female, who is here today for her routine physical.  Last CPE: > 1 year ago.  Regular exercise 3 or more time per week: She does not have an exercise routine but she is active at work, walks 10,000 steps in average. Following a healthy diet: She is trying to cook more,eating more salads, less red meat,using an air fryer. She lives with her husband. Sleeping about 7-8 hours, sometimes wakes up 1-2 times.  Chronic medical problems: Chronic headache,fatigue,goiter,and depression among some.  Pap smear:05/29/17. She follows with gyn,last seen on 10/22/19.  Immunization History  Administered Date(s) Administered  . Moderna Sars-Covid-2 Vaccination 09/26/2019, 10/17/2019  . PFIZER(Purple Top)SARS-COV-2 Vaccination 10/17/2019  . Td 03/05/2006  . Tdap 07/27/2020   Mammogram: 06/04/17 Bi-Rads 1. Colonoscopy: 11/05/18. DEXA: N/A Hep C screening: Never.  She has no concerns today.  Labs done on 06/01/20. Follows with [facility].  Lab Results  Component Value Date   CHOL 184 06/01/2020   HDL 64 06/01/2020   LDLCALC 103 (H) 06/01/2020   TRIG 94 06/01/2020   CHOLHDL 2.9 06/01/2020   Lab Results  Component Value Date   HGBA1C 5.1 06/01/2020   Review of Systems  Constitutional: Negative for appetite change, fatigue and fever.  HENT: Negative for hearing loss, mouth sores, sore throat, trouble swallowing and voice change.   Eyes: Negative for redness and visual disturbance.  Respiratory: Negative for cough, shortness of breath and wheezing.   Cardiovascular: Negative for chest pain and leg swelling.  Gastrointestinal: Negative for abdominal pain, nausea and vomiting.       No changes in bowel habits.  Endocrine: Negative for cold intolerance, heat intolerance, polydipsia, polyphagia and polyuria.  Genitourinary: Negative for decreased urine volume, dysuria, hematuria, vaginal bleeding and vaginal discharge.  Musculoskeletal:  Negative for gait problem and myalgias.  Skin: Negative for color change and rash.  Allergic/Immunologic: Negative for environmental allergies.  Neurological: Negative for syncope, weakness and headaches.  Hematological: Negative for adenopathy. Does not bruise/bleed easily.  Psychiatric/Behavioral: Negative for confusion and sleep disturbance. The patient is not nervous/anxious.   All other systems reviewed and are negative.  Current Outpatient Medications on File Prior to Visit  Medication Sig Dispense Refill  . Biotin w/ Vitamins C & E (HAIR/SKIN/NAILS PO) Take by mouth.    . diphenhydrAMINE HCl, Sleep, (NIGHT-TIME SLEEP AID PO) Take by mouth.    . Multiple Vitamins-Minerals (MULTIVITAMIN WITH MINERALS) tablet Take by mouth.    . Omega-3 Fatty Acids (FISH OIL PO) Take by mouth.    . valACYclovir (VALTREX) 500 MG tablet TAKE 1 TABLET BY MOUTH ONCE DAILY 90 tablet 3  . [DISCONTINUED] PARoxetine (PAXIL) 10 MG tablet Take 1 tablet (10 mg total) by mouth daily. 30 tablet 1  . [DISCONTINUED] venlafaxine XR (EFFEXOR XR) 37.5 MG 24 hr capsule Take 1 capsule (37.5 mg total) by mouth daily with breakfast. 30 capsule 1   No current facility-administered medications on file prior to visit.   Past Medical History:  Diagnosis Date  . Anemia   . Arthritis   . Complication of anesthesia   . Herpes genitalis in women 2010  . Lactose intolerance   . MRSA infection greater than 3 months ago 2009  . PONV (postoperative nausea and vomiting)   . Sarcoid 1993   in remission per pt  . SOBOE (shortness of breath on exertion)    Past Surgical History:  Procedure Laterality Date  . CALCANEAL  OSTEOTOMY Left 07/30/2019   Procedure: CALCANEAL OSTEOTOMY;  Surgeon: Toni Arthurs, MD;  Location: [facility];  Service: Orthopedics;  Laterality: Left;  Marland Kitchen GASTROC RECESSION EXTREMITY Left 07/30/2019   Procedure: LEFT GASTROC RECESSION;  Surgeon: Toni Arthurs, MD;  Location: [facility];   Service: Orthopedics;  Laterality: Left;  . LYMPH NODE BIOPSY  1993  . POSTERIOR TIBIAL TENDON REPAIR Left 07/30/2019   Procedure: EXCISION  LEFT ACCESSSORY NAVICULAR, POSTERIOR TIBIAL TENDON RECONSTRUCTION;  Surgeon: Toni Arthurs, MD;  Location: [facility];  Service: Orthopedics;  Laterality: Left;  . TUBAL LIGATION  1988    Allergies  Allergen Reactions  . Latex Hives and Itching  . Amoxicillin Hives  . Codeine Nausea And Vomiting  . Penicillins Hives    REACTION: hives    Family History  Problem Relation Age of Onset  . Other Mother   . Cancer Mother   . Hypertension Father   . Stroke Father   . Alcoholism Father   . Colon cancer Neg Hx   . Colon polyps Neg Hx   . Esophageal cancer Neg Hx   . Rectal cancer Neg Hx   . Stomach cancer Neg Hx     Social History   Socioeconomic History  . Marital status: Single    Spouse name: Not on file  . Number of children: 2  . Years of education: Not on file  . Highest education level: GED or equivalent  Occupational History  . Occupation: Psychologist, sport and exercise  Tobacco Use  . Smoking status: Current Some Day Smoker    Packs/day: 0.25    Types: Cigarettes  . Smokeless tobacco: Never Used  . Tobacco comment: 6 cig/day  Vaping Use  . Vaping Use: Every day  . Substances: Nicotine, Flavoring  Substance and Sexual Activity  . Alcohol use: Yes    Alcohol/week: 6.0 standard drinks    Types: 6 Cans of beer per week    Comment: 1-2 beer   . Drug use: No  . Sexual activity: Yes    Birth control/protection: None, Surgical  Other Topics Concern  . Not on file  Social History Narrative  . Not on file   Social Determinants of Health   Financial Resource Strain: Not on file  Food Insecurity: Not on file  Transportation Needs: Not on file  Physical Activity: Not on file  Stress: Not on file  Social Connections: Not on file   Vitals:   07/27/20 0720  BP: 130/80  Pulse: 79  Resp: 16  Temp: 98.5 F (36.9 C)  SpO2: 98%    Body mass index is 30.25 kg/m.  Wt Readings from Last 3 Encounters:  07/27/20 165 lb 6 oz (75 kg)  07/25/20 164 lb (74.4 kg)  07/11/20 164 lb (74.4 kg)   Physical Exam Vitals and nursing note reviewed.  Constitutional:      General: She is not in acute distress.    Appearance: She is well-developed.  HENT:     Head: Normocephalic and atraumatic.     Right Ear: Hearing, tympanic membrane, ear canal and external ear normal.     Left Ear: Hearing, tympanic membrane, ear canal and external ear normal.     Mouth/Throat:     Mouth: Mucous membranes are moist.     Pharynx: Uvula midline.  Eyes:     Extraocular Movements: Extraocular movements intact.     Conjunctiva/sclera: Conjunctivae normal.     Pupils: Pupils are equal, round, and  reactive to light.  Neck:     Thyroid: No thyromegaly.     Trachea: No tracheal deviation.  Cardiovascular:     Rate and Rhythm: Normal rate and regular rhythm.     Pulses:          Dorsalis pedis pulses are 2+ on the right side and 2+ on the left side.     Heart sounds: No murmur heard.   Pulmonary:     Effort: Pulmonary effort is normal. No respiratory distress.     Breath sounds: Normal breath sounds.  Chest:  Breasts:     Right: No supraclavicular adenopathy.     Left: No supraclavicular adenopathy.    Abdominal:     Palpations: Abdomen is soft. There is no hepatomegaly or mass.     Tenderness: There is no abdominal tenderness.  Genitourinary:    Comments: Deferred to gyn. Musculoskeletal:     Comments: No signs of synovitis appreciated.  Lymphadenopathy:     Cervical: No cervical adenopathy.     Upper Body:     Right upper body: No supraclavicular adenopathy.     Left upper body: No supraclavicular adenopathy.  Skin:    General: Skin is warm.     Findings: No erythema or rash.  Neurological:     General: No focal deficit present.     Mental Status: She is alert and oriented to person, place, and time.     Cranial Nerves: No  cranial nerve deficit.     Gait: Gait normal.     Deep Tendon Reflexes:     Reflex Scores:      Bicep reflexes are 2+ on the right side and 2+ on the left side.      Patellar reflexes are 2+ on the right side and 2+ on the left side. Psychiatric:        Mood and Affect: Mood is not anxious or depressed.     Comments: Well groomed, good eye contact.   ASSESSMENT AND PLAN:  Kathleen Vang was here today annual physical examination.  Orders Placed This Encounter  Procedures  . Tdap vaccine greater than or equal to 7yo IM  . Hepatitis C antibody screen   Routine general medical examination at a health care facility We discussed the importance of regular physical activity and healthy diet for prevention of chronic illness and/or complications. Preventive guidelines reviewed. Continue female care with her gyn. Vaccination updated. HCV screening with next blood draw. Ca++ and vit D supplementation recommended. Next CPE in a year.  The 10-year ASCVD risk score Denman George(Goff DC Montez HagemanJr., et al., 2013) is: 3.8%   Values used to calculate the score:     Age: [age] years     Sex: Female     Is Non-Hispanic African American: Yes     Diabetic: No     Tobacco smoker: Yes     Systolic Blood Pressure: 130 mmHg     Is BP treated: No     HDL Cholesterol: 64 mg/dL     Total Cholesterol: 184 mg/dL  Encounter for HCV screening test for low risk patient -     Hepatitis C antibody screen; Future  Need for Tdap vaccination -     Tdap vaccine greater than or equal to 7yo IM   Return in 1 year (on 07/27/2021) for CPE.  Courtlynn Holloman G. [place]az[place], MD  [facility]. [place] office.   Today you have you routine preventive visit. A few things to  remember from today's visit:  Routine general medical examination at a health care facility  Encounter for HCV screening test for low risk patient - Plan: Hepatitis C antibody screen  Need for Tdap vaccination - Plan: Tdap vaccine greater than or equal to 7yo  IM  Do not use My Chart to request refills or for acute issues that need immediate attention.   Please be sure medication list is accurate. If a new problem present, please set up appointment sooner than planned today.  At least 150 minutes of moderate exercise per week, daily brisk walking for 15-30 min is a good exercise option. Healthy diet low in saturated (animal) fats and sweets and consisting of fresh fruits and vegetables, lean meats such as fish and white chicken and whole grains.  These are some of recommendations for screening depending of age and risk factors:  - Vaccines:  Tdap vaccine every 10 years.  Shingles vaccine recommended at age 66, could be given after 52 years of age but not sure about insurance coverage.   Pneumonia vaccines: Pneumovax at 65. Sometimes Pneumovax is giving earlier if history of smoking, lung disease,diabetes,kidney disease among some.  Screening for diabetes at age 83 and every 3 years.  Cervical cancer prevention:  Pap smear starts at 52 years of age and continues periodically until 52 years old in low risk women. Pap smear every 3 years between 74 and 4 years old. Pap smear every 3-5 years between women 30 and older if pap smear negative and HPV screening negative.   -Breast cancer: Mammogram: There is disagreement between experts about when to start screening in low risk asymptomatic female but recent recommendations are to start screening at 76 and not later than 52 years old , every 1-2 years and after 52 yo q 2 years. Screening is recommended until 52 years old but some women can continue screening depending of healthy issues.  Colon cancer screening: Has been recently changed to 52 yo. Insurance may not cover until you are 52 years old. Screening is recommended until 52 years old.  Cholesterol disorder screening at age 58 and every 3 years.  Also recommended:  1. Dental visit- Brush and floss your teeth twice daily; visit your  dentist twice a year. 2. Eye doctor- Get an eye exam at least every 2 years. 3. Helmet use- Always wear a helmet when riding a bicycle, motorcycle, rollerblading or skateboarding. 4. Safe sex- If you may be exposed to sexually transmitted infections, use a condom. 5. Seat belts- Seat belts can save your live; always wear one. 6. Smoke/Carbon Monoxide detectors- These detectors need to be installed on the appropriate level of your home. Replace batteries at least once a year. 7. Skin cancer- When out in the sun please cover up and use sunscreen 15 SPF or higher. 8. Violence- If anyone is threatening or hurting you, please tell your healthcare provider.  9. Drink alcohol in moderation- Limit alcohol intake to one drink or less per day. Never drink and drive. 10. Calcium supplementation 1000 to 1200 mg daily, ideally through your diet.  Vitamin D supplementation 800 units daily.

## 2020-07-26 NOTE — Progress Notes (Signed)
Chief Complaint:   OBESITY Kathleen Vang is here to discuss her progress with her obesity treatment plan along with follow-up of her obesity related diagnoses. Kathleen Vang is on the Category 3 Plan and states she is following her eating plan approximately 95% of the time. Kathleen Vang states she is walking for 1-2.5 hours 7 times per week.   Today's visit was #: 4 Starting weight: 170 lbs Starting date: 06/01/2020 Today's weight: 164 lbs Today's date: 07/25/2020 Total lbs lost to date: 6 Total lbs lost since last in-office visit: 0  Interim History: Jury's weight remains the same. She is following the plan closely.  Subjective:   1. Low TSH level Kathleen Vang is not on medications. Last TSH level was 0.440.  2. Insulin resistance Kathleen Vang is not on medications currently.  Assessment/Plan:   1. Low TSH level We will follow over time, and Kathleen Vang will continue to follow up as directed. Orders and follow up as documented in patient record.  Counseling . Good thyroid control is important for overall health. Supratherapeutic thyroid levels are dangerous and will not improve weight loss results. . Counseling: The correct way to take levothyroxine is fasting, with water, separated by at least 30 minutes from breakfast, and separated by more than 4 hours from calcium, iron, multivitamins, acid reflux medications (PPIs).    2. Insulin resistance Kathleen Vang will continue to work on weight loss, exercise, and increasing protein and healthy fats, decreasing simple carbohydrates to help decrease the risk of diabetes. Kathleen Vang agreed to follow-up with [place] as directed to closely monitor her progress.  3. Obesity, current BMI 30 Kathleen Vang is currently in the action stage of change. As such, her goal is to continue with weight loss efforts. She has agreed to the Category 3 Plan or keeping a food journal and adhering to recommended goals of 1400-1500 calories and 80-90 grams of protein daily.   Intentional eating was  discussed.   Exercise goals: As is.  Behavioral modification strategies: increasing lean protein intake, decreasing simple carbohydrates, increasing vegetables, increasing water intake, decreasing eating out, no skipping meals, meal planning and cooking strategies, keeping healthy foods in the home and planning for success.  Kathleen Vang has agreed to follow-up with our clinic in 2 weeks. She was informed of the importance of frequent follow-up visits to maximize her success with intensive lifestyle modifications for her multiple health conditions.   Objective:   Blood pressure 134/81, pulse 100, temperature 98.6 F (37 C), height 5\' 2"  (1.575 m), weight 164 lb (74.4 kg), last menstrual period 07/25/2020, SpO2 97 %. Body mass index is 30 kg/m.  General: Cooperative, alert, well developed, in no acute distress. HEENT: Conjunctivae and lids unremarkable. Cardiovascular: Regular rhythm.  Lungs: Normal work of breathing. Neurologic: No focal deficits.   Lab Results  Component Value Date   CREATININE 0.61 06/01/2020   BUN 10 06/01/2020   NA 140 06/01/2020   K 4.4 06/01/2020   CL 103 06/01/2020   CO2 16 (L) 06/01/2020   Lab Results  Component Value Date   ALT 13 06/01/2020   AST 27 06/01/2020   ALKPHOS 73 06/01/2020   BILITOT 0.4 06/01/2020   Lab Results  Component Value Date   HGBA1C 5.1 06/01/2020   Lab Results  Component Value Date   INSULIN 14.0 06/01/2020   Lab Results  Component Value Date   TSH 0.440 (L) 06/01/2020   Lab Results  Component Value Date   CHOL 184 06/01/2020   HDL 64 06/01/2020  LDLCALC 103 (H) 06/01/2020   TRIG 94 06/01/2020   CHOLHDL 2.9 06/01/2020   Lab Results  Component Value Date   WBC 13.4 (H) 06/01/2020   HGB 15.4 06/01/2020   HCT 44.2 06/01/2020   MCV 96 06/01/2020   PLT 314 06/01/2020   No results found for: IRON, TIBC, FERRITIN  Attestation Statements:   Reviewed by clinician on day of visit: allergies, medications, problem  list, medical history, surgical history, family history, social history, and previous encounter notes.  Time spent on visit including pre-visit chart review and post-visit care and charting was 20 minutes.    Trude Mcburney, am acting as Energy manager for [employer], DO.  I have reviewed the above documentation for accuracy and completeness, and I agree with the above. Corinna Capra, DO

## 2020-07-27 ENCOUNTER — Ambulatory Visit (INDEPENDENT_AMBULATORY_CARE_PROVIDER_SITE_OTHER): Payer: No Typology Code available for payment source | Admitting: Family Medicine

## 2020-07-27 ENCOUNTER — Encounter (INDEPENDENT_AMBULATORY_CARE_PROVIDER_SITE_OTHER): Payer: Self-pay | Admitting: Bariatrics

## 2020-07-27 ENCOUNTER — Encounter: Payer: Self-pay | Admitting: Family Medicine

## 2020-07-27 VITALS — BP 130/80 | HR 79 | Temp 98.5°F | Resp 16 | Ht 62.0 in | Wt 165.4 lb

## 2020-07-27 DIAGNOSIS — Z23 Encounter for immunization: Secondary | ICD-10-CM | POA: Diagnosis not present

## 2020-07-27 DIAGNOSIS — Z0000 Encounter for general adult medical examination without abnormal findings: Secondary | ICD-10-CM

## 2020-07-27 DIAGNOSIS — Z1159 Encounter for screening for other viral diseases: Secondary | ICD-10-CM

## 2020-07-27 NOTE — Patient Instructions (Addendum)
Today you have you routine preventive visit. A few things to remember from today's visit:  Routine general medical examination at a health care facility  Encounter for HCV screening test for low risk patient - Plan: Hepatitis C antibody screen  Need for Tdap vaccination - Plan: Tdap vaccine greater than or equal to 52yo IM  Do not use My Chart to request refills or for acute issues that need immediate attention.   Please be sure medication list is accurate. If a new problem present, please set up appointment sooner than planned today.  At least 150 minutes of moderate exercise per week, daily brisk walking for 15-30 min is a good exercise option. Healthy diet low in saturated (animal) fats and sweets and consisting of fresh fruits and vegetables, lean meats such as fish and white chicken and whole grains.  These are some of recommendations for screening depending of age and risk factors:  - Vaccines:  Tdap vaccine every 10 years.  Shingles vaccine recommended at age 11, could be given after 52 years of age but not sure about insurance coverage.   Pneumonia vaccines: Pneumovax at 65. Sometimes Pneumovax is giving earlier if history of smoking, lung disease,diabetes,kidney disease among some.  Screening for diabetes at age 55 and every 3 years.  Cervical cancer prevention:  Pap smear starts at 52 years of age and continues periodically until 52 years old in low risk women. Pap smear every 3 years between 72 and 51 years old. Pap smear every 3-5 years between women 30 and older if pap smear negative and HPV screening negative.   -Breast cancer: Mammogram: There is disagreement between experts about when to start screening in low risk asymptomatic female but recent recommendations are to start screening at 63 and not later than 52 years old , every 1-2 years and after 52 yo q 2 years. Screening is recommended until 52 years old but some women can continue screening depending of healthy  issues.  Colon cancer screening: Has been recently changed to 52 yo. Insurance may not cover until you are 52 years old. Screening is recommended until 52 years old.  Cholesterol disorder screening at age 68 and every 3 years.  Also recommended:  1. Dental visit- Brush and floss your teeth twice daily; visit your dentist twice a year. 2. Eye doctor- Get an eye exam at least every 2 years. 3. Helmet use- Always wear a helmet when riding a bicycle, motorcycle, rollerblading or skateboarding. 4. Safe sex- If you may be exposed to sexually transmitted infections, use a condom. 5. Seat belts- Seat belts can save your live; always wear one. 6. Smoke/Carbon Monoxide detectors- These detectors need to be installed on the appropriate level of your home. Replace batteries at least once a year. 7. Skin cancer- When out in the sun please cover up and use sunscreen 15 SPF or higher. 8. Violence- If anyone is threatening or hurting you, please tell your healthcare provider.  9. Drink alcohol in moderation- Limit alcohol intake to one drink or less per day. Never drink and drive. 10. Calcium supplementation 1000 to 1200 mg daily, ideally through your diet.  Vitamin D supplementation 800 units daily.

## 2020-08-15 ENCOUNTER — Ambulatory Visit (INDEPENDENT_AMBULATORY_CARE_PROVIDER_SITE_OTHER): Payer: No Typology Code available for payment source | Admitting: Bariatrics

## 2020-08-16 ENCOUNTER — Other Ambulatory Visit: Payer: Self-pay

## 2020-08-17 ENCOUNTER — Encounter: Payer: Self-pay | Admitting: Family Medicine

## 2020-08-17 ENCOUNTER — Other Ambulatory Visit (HOSPITAL_COMMUNITY): Payer: Self-pay

## 2020-08-17 ENCOUNTER — Ambulatory Visit (INDEPENDENT_AMBULATORY_CARE_PROVIDER_SITE_OTHER): Payer: No Typology Code available for payment source | Admitting: Family Medicine

## 2020-08-17 VITALS — BP 132/80 | HR 81 | Temp 98.5°F | Resp 18 | Ht 62.0 in | Wt 165.8 lb

## 2020-08-17 DIAGNOSIS — M79671 Pain in right foot: Secondary | ICD-10-CM | POA: Diagnosis not present

## 2020-08-17 MED ORDER — CELECOXIB 100 MG PO CAPS
100.0000 mg | ORAL_CAPSULE | Freq: Two times a day (BID) | ORAL | 0 refills | Status: DC | PRN
  Filled 2020-08-17: qty 30, 15d supply, fill #0

## 2020-08-17 NOTE — Patient Instructions (Addendum)
A few things to remember from today's visit:   Acute foot pain, right - Plan: DG Foot Complete Right  If you need refills please call your pharmacy. Do not use My Chart to request refills or for acute issues that need immediate attention.   Take Celebrex 100 mg 2 times daily for 7-10 days then once daily as needed. Please let me know if you want to see podiatrist. Remove insert and see if there is any difference. Wear comfortable shoes, tennis shoes ideally.  X ray order placed. Please be sure medication list is accurate. If a new problem present, please set up appointment sooner than planned today.

## 2020-08-17 NOTE — Progress Notes (Signed)
Chief Complaint  Patient presents with   right foot pain    X a couple weeks ago off and on but has become consistent. Pt suspects Bursitis. Pain is inside the arch. Ibuprofen helps temporarily.   HPI: Kathleen Vang is a 50 y.o. female, who is here today complaining of right foot pain as described above. This is a new problem that started 2-3 weeks ago. Pain is on medial aspect of arch and radiates to medial aspect of right ankle. No hx of trauma. Pain is constant, exacerbated by walking and prolonged standing. She wear shoe inserts and wonders if it is making it worse. She has not noted erythema, mild edema. Alleviated by rest. Pain is "12/10." Stable.  Negative for fever,chills,unusual fatigue,or skin rash.  Review of Systems  Constitutional:  Positive for activity change. Negative for appetite change.  HENT:  Negative for mouth sores, sore throat and trouble swallowing.   Respiratory:  Negative for shortness of breath and wheezing.   Cardiovascular:  Negative for chest pain, palpitations and leg swelling.  Gastrointestinal:        Negative for changes in bowel habits.  Neurological:  Negative for syncope, weakness and numbness.  Rest see pertinent positives and negatives per HPI.  Current Outpatient Medications on File Prior to Visit  Medication Sig Dispense Refill   Multiple Vitamins-Minerals (MULTIVITAMIN WITH MINERALS) tablet Take by mouth.     Omega-3 Fatty Acids (FISH OIL PO) Take by mouth.     valACYclovir (VALTREX) 500 MG tablet TAKE 1 TABLET BY MOUTH ONCE DAILY 90 tablet 3   [DISCONTINUED] PARoxetine (PAXIL) 10 MG tablet Take 1 tablet (10 mg total) by mouth daily. 30 tablet 1   [DISCONTINUED] venlafaxine XR (EFFEXOR XR) 37.5 MG 24 hr capsule Take 1 capsule (37.5 mg total) by mouth daily with breakfast. 30 capsule 1   No current facility-administered medications on file prior to visit.   Past Medical History:  Diagnosis Date   Anemia    Arthritis     Complication of anesthesia    Herpes genitalis in women 2010   Lactose intolerance    MRSA infection greater than 3 months ago 2009   PONV (postoperative nausea and vomiting)    Sarcoid 1993   in remission per pt   SOBOE (shortness of breath on exertion)    Allergies  Allergen Reactions   Latex Hives and Itching   Amoxicillin Hives   Codeine Nausea And Vomiting   Penicillins Hives    REACTION: hives    Social History   Socioeconomic History   Marital status: Single    Spouse name: Not on file   Number of children: 2   Years of education: Not on file   Highest education level: GED or equivalent  Occupational History   Occupation: Psychologist, sport and exercise  Tobacco Use   Smoking status: Some Days    Packs/day: 0.25    Pack years: 0.00    Types: Cigarettes   Smokeless tobacco: Never   Tobacco comments:    6 cig/day  Vaping Use   Vaping Use: Every day   Substances: Nicotine, Flavoring  Substance and Sexual Activity   Alcohol use: Yes    Alcohol/week: 6.0 standard drinks    Types: 6 Cans of beer per week    Comment: 1-2 beer    Drug use: No   Sexual activity: Yes    Birth control/protection: None, Surgical  Other Topics Concern   Not on file  Social History  Narrative   Not on file   Social Determinants of Health   Financial Resource Strain: Not on file  Food Insecurity: Not on file  Transportation Needs: Not on file  Physical Activity: Not on file  Stress: Not on file  Social Connections: Not on file    Vitals:   08/17/20 1612  BP: 132/80  Pulse: 81  Resp: 18  Temp: 98.5 F (36.9 C)  SpO2: 100%   Body mass index is 30.33 kg/m.  Physical Exam Vitals and nursing note reviewed.  Constitutional:      General: She is not in acute distress.    Appearance: She is well-developed. She is not ill-appearing.  HENT:     Head: Normocephalic and atraumatic.  Eyes:     Conjunctiva/sclera: Conjunctivae normal.  Cardiovascular:     Rate and Rhythm: Normal rate and  regular rhythm.     Pulses:          Dorsalis pedis pulses are 2+ on the right side.       Posterior tibial pulses are 2+ on the right side.  Pulmonary:     Effort: Pulmonary effort is normal. No respiratory distress.  Musculoskeletal:     Right foot: Normal range of motion and normal capillary refill. Tenderness present. No bony tenderness. Normal pulse.       Feet:  Skin:    General: Skin is warm.     Findings: No erythema or rash.  Neurological:     Mental Status: She is alert and oriented to person, place, and time.  Psychiatric:     Comments: Well groomed, good eye contact.    ASSESSMENT AND PLAN:  [name] was seen today for right foot pain.  Diagnoses and all orders for this visit: Orders Placed This Encounter  Procedures   DG Foot Complete Right   Acute foot pain, right We discussed possible etiologies. Tenosynovitis/tendinitis. After discussion of some side effects she agrees with short course of Celebrex. Try without shoe insert and continue wearing a comfortable shoe. She prefers to hold on podiatrist referral. Further recommendations according to X ray result.  -     celecoxib (CELEBREX) 100 MG capsule; Take 1 capsule (100 mg total) by mouth 2 (two) times daily as needed.  Return if symptoms worsen or fail to improve.  Luisfernando Brightwell G. [place], MD  [facility]. [place] office.   A few things to remember from today's visit:   Acute foot pain, right - Plan: DG Foot Complete Right  If you need refills please call your pharmacy. Do not use My Chart to request refills or for acute issues that need immediate attention.   Take Celebrex 100 mg 2 times daily for 7-10 days then once daily as needed. Please let me know if you want to see podiatrist. Remove insert and see if there is any difference. Wear comfortable shoes, tennis shoes ideally.  X ray order placed. Please be sure medication list is accurate. If a new problem present, please set up  appointment sooner than planned today.

## 2020-08-18 ENCOUNTER — Other Ambulatory Visit (HOSPITAL_COMMUNITY): Payer: Self-pay

## 2020-09-27 ENCOUNTER — Other Ambulatory Visit (HOSPITAL_COMMUNITY): Payer: Self-pay

## 2020-09-27 MED ORDER — TRIAMCINOLONE ACETONIDE 0.1 % EX CREA
TOPICAL_CREAM | CUTANEOUS | 3 refills | Status: AC
  Filled 2020-09-27: qty 80, 30d supply, fill #0

## 2020-09-28 ENCOUNTER — Other Ambulatory Visit (HOSPITAL_COMMUNITY): Payer: Self-pay

## 2020-09-28 MED FILL — Valacyclovir HCl Tab 500 MG: ORAL | 90 days supply | Qty: 90 | Fill #1 | Status: AC

## 2020-09-30 ENCOUNTER — Other Ambulatory Visit: Payer: Self-pay

## 2020-09-30 ENCOUNTER — Encounter: Payer: Self-pay | Admitting: Family Medicine

## 2020-09-30 ENCOUNTER — Ambulatory Visit (INDEPENDENT_AMBULATORY_CARE_PROVIDER_SITE_OTHER): Payer: No Typology Code available for payment source | Admitting: Family Medicine

## 2020-09-30 VITALS — BP 126/70 | HR 96 | Resp 12 | Ht 62.0 in | Wt 165.0 lb

## 2020-09-30 DIAGNOSIS — F17200 Nicotine dependence, unspecified, uncomplicated: Secondary | ICD-10-CM

## 2020-09-30 DIAGNOSIS — L298 Other pruritus: Secondary | ICD-10-CM | POA: Diagnosis not present

## 2020-09-30 NOTE — Patient Instructions (Signed)
A few things to remember from today's visit:  Referral to derma will be placed. Continue working on smoking cessation. Let me know if referrals area needed in the future for gyn and ortho.  If you need refills please call your pharmacy. Do not use My Chart to request refills or for acute issues that need immediate attention.    Please be sure medication list is accurate. If a new problem present, please set up appointment sooner than planned today.

## 2020-09-30 NOTE — Progress Notes (Signed)
Chief Complaint  Patient presents with   Referral    dermatology   HPI: Kathleen Vang is a 50 y.o. female, who is here today requesting referral to dermatologist. She is currently established with dermatologist,she follows as needed, recently she had a visit. According to patient, she needs a referral to continue management and to cover recent visit. Long history of pruritic rash around neck. Problem is intermittent. She has not identified exacerbating factors. Topical steroid helps. She is currently on triamcinolone cream 0.1%.  With her health insurance, focus plan, she needs referral every year.  Hx of sarcoidosis on remission. + Smoker. She is working on smoking cessation.  Review of Systems  Constitutional:  Positive for fatigue. Negative for activity change, appetite change and fever.  HENT:  Negative for mouth sores and nosebleeds.   Eyes:  Negative for discharge, redness and itching.  Respiratory:  Negative for cough, shortness of breath and wheezing.   Gastrointestinal:  Negative for abdominal pain, nausea and vomiting.       Negative for changes in bowel habits.  Rest see pertinent positives and negatives per HPI.  Current Outpatient Medications on File Prior to Visit  Medication Sig Dispense Refill   celecoxib (CELEBREX) 100 MG capsule Take 1 capsule (100 mg total) by mouth 2 (two) times daily as needed. 30 capsule 0   Multiple Vitamins-Minerals (MULTIVITAMIN WITH MINERALS) tablet Take by mouth.     Omega-3 Fatty Acids (FISH OIL PO) Take by mouth.     triamcinolone cream (KENALOG) 0.1 % Apply a small amount to affected area once a day as needed 80 g 3   valACYclovir (VALTREX) 500 MG tablet TAKE 1 TABLET BY MOUTH ONCE DAILY 90 tablet 3   [DISCONTINUED] PARoxetine (PAXIL) 10 MG tablet Take 1 tablet (10 mg total) by mouth daily. 30 tablet 1   [DISCONTINUED] venlafaxine XR (EFFEXOR XR) 37.5 MG 24 hr capsule Take 1 capsule (37.5 mg total) by mouth daily with  breakfast. 30 capsule 1   No current facility-administered medications on file prior to visit.   Past Medical History:  Diagnosis Date   Anemia    Arthritis    Complication of anesthesia    Herpes genitalis in women 2010   Lactose intolerance    MRSA infection greater than 3 months ago 2009   PONV (postoperative nausea and vomiting)    Sarcoid 1993   in remission per pt   SOBOE (shortness of breath on exertion)    Allergies  Allergen Reactions   Latex Hives and Itching   Amoxicillin Hives   Codeine Nausea And Vomiting   Penicillins Hives    REACTION: hives    Social History   Socioeconomic History   Marital status: Single    Spouse name: Not on file   Number of children: 2   Years of education: Not on file   Highest education level: GED or equivalent  Occupational History   Occupation: Psychologist, sport and exercise  Tobacco Use   Smoking status: Some Days    Packs/day: 0.25    Types: Cigarettes   Smokeless tobacco: Never   Tobacco comments:    6 cig/day  Vaping Use   Vaping Use: Every day   Substances: Nicotine, Flavoring  Substance and Sexual Activity   Alcohol use: Yes    Alcohol/week: 6.0 standard drinks    Types: 6 Cans of beer per week    Comment: 1-2 beer    Drug use: No   Sexual activity: Yes  Birth control/protection: None, Surgical  Other Topics Concern   Not on file  Social History Narrative   Not on file   Social Determinants of Health   Financial Resource Strain: Not on file  Food Insecurity: Not on file  Transportation Needs: Not on file  Physical Activity: Not on file  Stress: Not on file  Social Connections: Not on file    Vitals:   09/30/20 1418  BP: 126/70  Pulse: 96  Resp: 12  SpO2: 97%   Body mass index is 30.18 kg/m.  Physical Exam Vitals and nursing note reviewed.  Constitutional:      General: She is not in acute distress.    Appearance: She is well-developed.  HENT:     Head: Atraumatic.  Eyes:     Conjunctiva/sclera:  Conjunctivae normal.  Cardiovascular:     Rate and Rhythm: Normal rate and regular rhythm.  Pulmonary:     Effort: Pulmonary effort is normal. No respiratory distress.     Breath sounds: Normal breath sounds.  Lymphadenopathy:     Cervical: No cervical adenopathy.  Skin:    General: Skin is warm.     Findings: No rash.  Neurological:     Mental Status: She is alert and oriented to person, place, and time.  Psychiatric:        Speech: Speech normal.     Comments: Well groomed, good eye contact.   ASSESSMENT AND PLAN:  Ms.Akayla was seen today for referral.  Diagnoses and all orders for this visit:  Chronic pruritic rash in adult Problem seems to be stable. Today I do not appreciate a rash. Continue following with dermatologist.  TOBACCO ABUSE She understand the adverse effects of tobacco use as well as the benefits of smoking cessation. Encouraged to continue working on smoking cessation.  Return if symptoms worsen or fail to improve, for Keep next visit.Marland Kitchen  Averianna Brugger G. [place], MD  [facility]. [place] office.

## 2020-10-24 ENCOUNTER — Other Ambulatory Visit (HOSPITAL_COMMUNITY): Payer: Self-pay

## 2020-10-24 LAB — HM PAP SMEAR: HM Pap smear: NORMAL

## 2020-10-24 MED ORDER — VALACYCLOVIR HCL 500 MG PO TABS
500.0000 mg | ORAL_TABLET | Freq: Every day | ORAL | 3 refills | Status: DC
  Filled 2020-10-24 – 2021-02-09 (×2): qty 90, 90d supply, fill #0
  Filled 2021-06-01: qty 90, 90d supply, fill #1
  Filled 2021-09-06: qty 90, 90d supply, fill #2
  Filled 2021-10-23: qty 90, 90d supply, fill #3

## 2020-11-16 ENCOUNTER — Ambulatory Visit (INDEPENDENT_AMBULATORY_CARE_PROVIDER_SITE_OTHER)
Admission: RE | Admit: 2020-11-16 | Discharge: 2020-11-16 | Disposition: A | Discharge status: Home / Self Care | Payer: No Typology Code available for payment source | Source: Ambulatory Visit | Attending: Family Medicine | Admitting: Family Medicine

## 2020-11-16 ENCOUNTER — Other Ambulatory Visit: Payer: Self-pay

## 2020-11-16 DIAGNOSIS — M79671 Pain in right foot: Secondary | ICD-10-CM

## 2020-11-21 ENCOUNTER — Other Ambulatory Visit (HOSPITAL_COMMUNITY): Payer: Self-pay

## 2020-11-21 ENCOUNTER — Telehealth: Payer: Self-pay | Admitting: Family Medicine

## 2020-11-21 MED ORDER — CELECOXIB 100 MG PO CAPS
100.0000 mg | ORAL_CAPSULE | Freq: Every day | ORAL | 0 refills | Status: DC
  Filled 2020-11-21: qty 15, 15d supply, fill #0

## 2020-11-21 NOTE — Telephone Encounter (Signed)
Patient is having pain in foot from bone spurs and is wondering if she can have anything for the pain     Good callback number is [phone]    If prescription is prescribed please send to  [facility] Phone:  [phone]  Fax:  [phone]       Please Advise

## 2020-11-21 NOTE — Telephone Encounter (Signed)
You saw pt for the same issue on 08/17/20, okay to refill the celebrex?

## 2020-11-21 NOTE — Telephone Encounter (Signed)
Appointment needed

## 2020-11-29 ENCOUNTER — Other Ambulatory Visit (HOSPITAL_COMMUNITY): Payer: Self-pay

## 2020-12-01 ENCOUNTER — Telehealth: Payer: Self-pay | Admitting: Family Medicine

## 2020-12-01 DIAGNOSIS — M79671 Pain in right foot: Secondary | ICD-10-CM

## 2020-12-01 NOTE — Telephone Encounter (Signed)
Patient called to let [facility] know that the Celebrex was not working. Patient wants to discuss where to go from here.    Good callback numbers are [phone] or [phone]

## 2020-12-02 NOTE — Telephone Encounter (Signed)
I called and spoke with patient, referral to podiatry placed.

## 2020-12-12 ENCOUNTER — Ambulatory Visit (INDEPENDENT_AMBULATORY_CARE_PROVIDER_SITE_OTHER): Payer: No Typology Code available for payment source | Admitting: Podiatry

## 2020-12-12 ENCOUNTER — Other Ambulatory Visit: Payer: Self-pay

## 2020-12-12 ENCOUNTER — Encounter: Payer: Self-pay | Admitting: Podiatry

## 2020-12-12 DIAGNOSIS — M76821 Posterior tibial tendinitis, right leg: Secondary | ICD-10-CM

## 2020-12-12 MED ORDER — TRIAMCINOLONE ACETONIDE 10 MG/ML IJ SUSP
10.0000 mg | Freq: Once | INTRAMUSCULAR | Status: AC
  Administered 2020-12-12: 10 mg

## 2020-12-12 NOTE — Patient Instructions (Signed)

## 2020-12-13 ENCOUNTER — Telehealth: Payer: Self-pay | Admitting: Podiatry

## 2020-12-13 ENCOUNTER — Other Ambulatory Visit (HOSPITAL_COMMUNITY): Payer: Self-pay

## 2020-12-13 MED ORDER — ESTRADIOL 0.05 MG/24HR TD PTTW
1.0000 | MEDICATED_PATCH | TRANSDERMAL | 0 refills | Status: DC
  Filled 2020-12-13: qty 8, 28d supply, fill #0

## 2020-12-13 MED ORDER — PROGESTERONE MICRONIZED 100 MG PO CAPS
100.0000 mg | ORAL_CAPSULE | Freq: Every day | ORAL | 3 refills | Status: DC
  Filled 2020-12-13: qty 90, 90d supply, fill #0
  Filled 2021-03-15: qty 90, 90d supply, fill #1
  Filled 2021-06-16: qty 90, 90d supply, fill #2
  Filled 2021-09-14: qty 90, 90d supply, fill #3

## 2020-12-13 NOTE — Telephone Encounter (Signed)
Pt called to request antiinflammatory medication discussed at yesterday's appt to be sent to pharmacy on file.

## 2020-12-13 NOTE — Progress Notes (Signed)
Subjective:   Patient ID: Kathleen Vang, female   DOB: 50 y.o.   MRN: [id number]   HPI Patient presents stating that she has been having a lot of pain in her right medial foot stating that its been going on about 3 months and it is worse when walking and in the morning.  Patient states that she had surgery on her left 1 and she does not want to have surgery on this if she can avoid.  Patient does smoke mild with just a few cigarettes per day and would like to be more active but cannot because of the pain and does work a weightbearing job   Review of Systems  All other systems reviewed and are negative.      Objective:  Physical Exam Vitals and nursing note reviewed.  Constitutional:      Appearance: She is well-developed.  Pulmonary:     Effort: Pulmonary effort is normal.  Musculoskeletal:        General: Normal range of motion.  Skin:    General: Skin is warm.  Neurological:     Mental Status: She is alert.    Neurovascular status found to be intact muscle strength was found to be adequate range of motion within normal limits.  Patient is found to have inflammation around the posterior tibial tendon as it comes under the medial malleolus and inserts into the navicular with moderate swelling around the area.  Patient is found to have good digital perfusion well oriented scars on the left from previous surgery with flattening of the arch noted bilateral      Assessment:  Posterior tibial tendinitis right with probability for bone injury with x-rays that she brought [place] today     Plan:  H&P reviewed condition and x-rays she brought which were nonweightbearing.  Today I did go ahead I did sterile prep and I injected the insertion posterior tibial tendon 3 mg dexamethasone Kenalog 5 mg Xylocaine and I discussed the boot she has at home which she can wear and I applied a fascial brace to wear as much as possible.  Discussed long-term orthotics which I think will be of benefit but first I  want to see this get better and it is possible will require MRI and it is possible at 1 point in future surgical intervention will be necessary

## 2020-12-16 ENCOUNTER — Other Ambulatory Visit (HOSPITAL_COMMUNITY): Payer: Self-pay

## 2020-12-16 ENCOUNTER — Other Ambulatory Visit: Payer: Self-pay | Admitting: Podiatry

## 2020-12-16 MED ORDER — DICLOFENAC SODIUM 75 MG PO TBEC
75.0000 mg | DELAYED_RELEASE_TABLET | Freq: Two times a day (BID) | ORAL | 2 refills | Status: DC
Start: 2020-12-16 — End: 2021-09-25
  Filled 2020-12-16: qty 50, 25d supply, fill #0

## 2020-12-26 ENCOUNTER — Other Ambulatory Visit (HOSPITAL_COMMUNITY): Payer: Self-pay

## 2020-12-26 ENCOUNTER — Telehealth: Payer: Self-pay | Admitting: Family Medicine

## 2020-12-26 NOTE — Telephone Encounter (Signed)
Patient called to see if it was okay to get the first shingles vaccine.       Good callback number is [phone]      Please advise

## 2020-12-27 NOTE — Telephone Encounter (Signed)
Yes, it is ok to start shingrix vaccination between 29 and 53 yo. Thanks, BJ

## 2020-12-27 NOTE — Telephone Encounter (Signed)
I called and spoke with patient. She is aware that it is okay to receive the first shingles vaccine.

## 2020-12-29 ENCOUNTER — Other Ambulatory Visit (HOSPITAL_COMMUNITY): Payer: Self-pay

## 2020-12-29 MED ORDER — ZOSTER VAC RECOMB ADJUVANTED 50 MCG/0.5ML IM SUSR
INTRAMUSCULAR | 1 refills | Status: DC
Start: 2020-12-29 — End: 2021-09-25
  Filled 2020-12-29: qty 0.5, 1d supply, fill #0
  Filled 2021-03-03 – 2021-03-13 (×2): qty 0.5, 1d supply, fill #1

## 2020-12-30 ENCOUNTER — Other Ambulatory Visit (HOSPITAL_COMMUNITY): Payer: Self-pay

## 2021-01-02 ENCOUNTER — Ambulatory Visit (INDEPENDENT_AMBULATORY_CARE_PROVIDER_SITE_OTHER): Payer: No Typology Code available for payment source | Admitting: Podiatry

## 2021-01-02 ENCOUNTER — Encounter: Payer: Self-pay | Admitting: Podiatry

## 2021-01-02 ENCOUNTER — Other Ambulatory Visit: Payer: Self-pay

## 2021-01-02 DIAGNOSIS — M76822 Posterior tibial tendinitis, left leg: Secondary | ICD-10-CM

## 2021-01-02 DIAGNOSIS — M76821 Posterior tibial tendinitis, right leg: Secondary | ICD-10-CM

## 2021-01-02 NOTE — Progress Notes (Signed)
Subjective:   Patient ID: Kathleen Vang, female   DOB: 50 y.o.   MRN: [id number]   HPI Patient presents stating that she does not have improvement but she still is getting some discomfort and still has some enlargement and would like inserts with history of surgery on her left 1   ROS      Objective:  Physical Exam  Neuro vascular status intact with the right posterior tibial insertion into the navicular still found to be mildly to moderately inflamed but improved with patient having flatfoot deformity bilateral history of surgery on the left 1 in the past with flatfoot      Assessment:  Posterior tibial tendinitis with flatfoot deformity causing stress on the insertion posterior tibial tendon with history of surgery on the left     Plan:  H&P reviewed condition and the importance of supportive shoes and went ahead and casted for functional orthotics that we will elevate with instructions for continuing to wear her brace reappoint when orthotics return

## 2021-02-02 ENCOUNTER — Ambulatory Visit: Payer: No Typology Code available for payment source

## 2021-02-02 ENCOUNTER — Other Ambulatory Visit: Payer: Self-pay

## 2021-02-02 DIAGNOSIS — M76821 Posterior tibial tendinitis, right leg: Secondary | ICD-10-CM

## 2021-02-02 NOTE — Progress Notes (Signed)
SITUATION: Reason for Visit: Fitting and Delivery of Custom Fabricated Foot Orthoses Patient Report: Patient reports comfort and is satisfied with device.  OBJECTIVE DATA: Patient History / Diagnosis:  No change in pathology Provided Device:  Custom functional foot orthoses  GOAL OF ORTHOSIS - Improve gait - Decrease energy expenditure - Improve Balance - Provide Triplanar stability of foot complex - Facilitate motion  ACTIONS PERFORMED Patient was fit with foot orthoses trimmed to shoe last. Patient tolerated fittign procedure. Device was modified as follows to better fit patient: - Toe plate was trimmed to shoe last  Patient was provided with verbal and written instruction and demonstration regarding donning, doffing, wear, care, proper fit, function, purpose, cleaning, and use of the orthosis and in all related precautions and risks and benefits regarding the orthosis.  Patient was also provided with verbal instruction regarding how to report any failures or malfunctions of the orthosis and necessary follow up care. Patient was also instructed to contact our office regarding any change in status that may affect the function of the orthosis.  Patient demonstrated independence with proper donning, doffing, and fit and verbalized understanding of all instructions.  PLAN: Patient is to follow up in one week or as necessary (PRN). All questions were answered and concerns addressed. Plan of care was discussed with and agreed upon by the patient.  

## 2021-02-09 ENCOUNTER — Other Ambulatory Visit (HOSPITAL_COMMUNITY): Payer: Self-pay

## 2021-02-10 ENCOUNTER — Other Ambulatory Visit (HOSPITAL_COMMUNITY): Payer: Self-pay

## 2021-02-24 ENCOUNTER — Other Ambulatory Visit (HOSPITAL_COMMUNITY): Payer: Self-pay

## 2021-02-24 MED ORDER — CARESTART COVID-19 HOME TEST VI KIT
PACK | 0 refills | Status: DC
  Filled 2021-02-24: qty 4, 4d supply, fill #0

## 2021-03-03 ENCOUNTER — Other Ambulatory Visit (HOSPITAL_COMMUNITY): Payer: Self-pay

## 2021-03-13 ENCOUNTER — Other Ambulatory Visit (HOSPITAL_COMMUNITY): Payer: Self-pay

## 2021-03-15 ENCOUNTER — Other Ambulatory Visit (HOSPITAL_COMMUNITY): Payer: Self-pay

## 2021-03-20 ENCOUNTER — Other Ambulatory Visit (HOSPITAL_COMMUNITY): Payer: Self-pay

## 2021-03-20 MED ORDER — ESTRADIOL 0.075 MG/24HR TD PTTW
1.0000 | MEDICATED_PATCH | TRANSDERMAL | 1 refills | Status: DC
  Filled 2021-03-20: qty 8, 28d supply, fill #0

## 2021-05-12 ENCOUNTER — Ambulatory Visit: Payer: No Typology Code available for payment source | Admitting: Podiatry

## 2021-05-12 ENCOUNTER — Other Ambulatory Visit: Payer: Self-pay

## 2021-05-12 ENCOUNTER — Ambulatory Visit (INDEPENDENT_AMBULATORY_CARE_PROVIDER_SITE_OTHER): Payer: No Typology Code available for payment source

## 2021-05-12 ENCOUNTER — Encounter: Payer: Self-pay | Admitting: Podiatry

## 2021-05-12 DIAGNOSIS — M79671 Pain in right foot: Secondary | ICD-10-CM | POA: Diagnosis not present

## 2021-05-12 DIAGNOSIS — T148XXA Other injury of unspecified body region, initial encounter: Secondary | ICD-10-CM | POA: Diagnosis not present

## 2021-05-14 NOTE — Progress Notes (Signed)
Subjective:  ? ?Patient ID: Kathleen Vang, female   DOB: 51 y.o.   MRN: [id number]  ? ?HPI ?Patient presents stating the pain in the right arch is quite intense and is not responding at this time.  States that the orthotics seem to help for a while but they are now bothering her and making it hard to be comfortable ? ? ?ROS ? ? ?   ?Objective:  ?Physical Exam  ?Neurovascular status intact with patient found to have inflammation pain at the posterior tibial insertion right with history of having to have surgery on the left 1.  Does still have boot left from the other foot ? ?   ?Assessment:  ?Appears to be an acute posterior tibial tendinitis right localized to this area that also has a moderate flatfoot with history of surgery left ? ?   ?Plan:  ?[place] view x-rays taken right and at this point I want her to wear her boot with ice therapy and I am sending for MRI to rule out a tear or possible dysfunction of the tendon along with accessory navicular and I am sending back to pedorthist to try to have orthotics modified.  May ultimately require surgery ? ?X-rays indicate the probability for accessory navicular right and enlargement around the navicular head medial side ?   ? ? ?

## 2021-05-17 ENCOUNTER — Other Ambulatory Visit: Payer: Self-pay | Admitting: Podiatry

## 2021-05-17 DIAGNOSIS — T148XXA Other injury of unspecified body region, initial encounter: Secondary | ICD-10-CM

## 2021-05-18 ENCOUNTER — Ambulatory Visit: Payer: No Typology Code available for payment source

## 2021-05-18 ENCOUNTER — Other Ambulatory Visit: Payer: Self-pay

## 2021-05-18 NOTE — Progress Notes (Signed)
SITUATION ?Reason for Consult: Follow-up with bilateral foot orthotics ?Patient / Caregiver Report: Patient reports navicular pain bilaterally ? ?OBJECTIVE DATA ?History / Diagnosis:  ?  ICD-10-CM   ?1. Posterior tibial tendinitis of right lower extremity  M76.821   ?  ? ? ?Change in Pathology: None ? ?ACTIONS PERFORMED ?Patient's equipment was checked for structural stability and fit. Heated and relieved naviculars. Device(s) intact and fit is excellent. All questions answered and concerns addressed. ? ?PLAN ?Follow-up as needed (PRN). Plan of care discussed with and agreed upon by patient / caregiver. ? ?

## 2021-05-28 ENCOUNTER — Ambulatory Visit
Admission: RE | Admit: 2021-05-28 | Discharge: 2021-05-28 | Disposition: A | Discharge status: Home / Self Care | Payer: No Typology Code available for payment source | Source: Ambulatory Visit | Attending: Podiatry | Admitting: Podiatry

## 2021-05-28 ENCOUNTER — Other Ambulatory Visit: Payer: Self-pay

## 2021-05-28 DIAGNOSIS — T148XXA Other injury of unspecified body region, initial encounter: Secondary | ICD-10-CM

## 2021-06-01 ENCOUNTER — Other Ambulatory Visit (HOSPITAL_COMMUNITY): Payer: Self-pay

## 2021-06-07 ENCOUNTER — Ambulatory Visit (INDEPENDENT_AMBULATORY_CARE_PROVIDER_SITE_OTHER): Payer: No Typology Code available for payment source | Admitting: Podiatry

## 2021-06-07 ENCOUNTER — Encounter: Payer: Self-pay | Admitting: Podiatry

## 2021-06-07 DIAGNOSIS — M76821 Posterior tibial tendinitis, right leg: Secondary | ICD-10-CM | POA: Diagnosis not present

## 2021-06-07 NOTE — Progress Notes (Signed)
Subjective:  ? ?Patient ID: Kathleen Vang, female   DOB: 51 y.o.   MRN: [id number]  ? ?HPI ?Patient states she seems to be feeling some better and states while it still sore its not as bad as it was ? ? ?ROS ? ? ?   ?Objective:  ?Physical Exam  ?Neurovascular status intact diminishment of discomfort around the right posterior tibial tendon with pain still upon deep palpation but she is getting benefit from having used the boot and the brace ? ?   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 ?   ?Plan:  ?Reviewed condition do not see a tear based on the MRI results and I recommended conservative orthotic usage ice therapy occasional boot usage and brace therapy as needed.  Patient will be seen back if symptoms indicate may ultimately require surgery like the left foot ?   ? ? ?

## 2021-06-16 ENCOUNTER — Other Ambulatory Visit (HOSPITAL_COMMUNITY): Payer: Self-pay

## 2021-07-18 ENCOUNTER — Other Ambulatory Visit (HOSPITAL_COMMUNITY): Payer: Self-pay

## 2021-07-18 ENCOUNTER — Ambulatory Visit (INDEPENDENT_AMBULATORY_CARE_PROVIDER_SITE_OTHER): Payer: No Typology Code available for payment source

## 2021-07-18 ENCOUNTER — Encounter: Payer: Self-pay | Admitting: Orthopaedic Surgery

## 2021-07-18 ENCOUNTER — Ambulatory Visit (INDEPENDENT_AMBULATORY_CARE_PROVIDER_SITE_OTHER): Payer: No Typology Code available for payment source | Admitting: Orthopaedic Surgery

## 2021-07-18 VITALS — Ht 62.0 in | Wt 160.0 lb

## 2021-07-18 DIAGNOSIS — G8929 Other chronic pain: Secondary | ICD-10-CM

## 2021-07-18 DIAGNOSIS — M545 Low back pain, unspecified: Secondary | ICD-10-CM

## 2021-07-18 MED ORDER — METHOCARBAMOL 750 MG PO TABS
750.0000 mg | ORAL_TABLET | Freq: Two times a day (BID) | ORAL | 2 refills | Status: DC | PRN
  Filled 2021-07-18: qty 20, 10d supply, fill #0

## 2021-07-18 MED ORDER — PREDNISONE 10 MG (21) PO TBPK
ORAL_TABLET | ORAL | 0 refills | Status: DC
  Filled 2021-07-18: qty 21, 6d supply, fill #0

## 2021-07-18 NOTE — Progress Notes (Signed)
? ?Office Visit Note ?  ?Patient: Kathleen Vang           ?Date of Birth: 1970-12-02           ?MRN: [id number] ?Visit Date: 07/18/2021 ?             ?Requested by: [place], Betty G, MD ?[phone] ?[place],  [place] [address] ?PCP: [place], Betty G, MD ? ? ?Assessment & Plan: ?Visit Diagnoses:  ?1. Chronic left-sided low back pain, unspecified whether sciatica present   ? ? ?Plan: Impression is left lower extremity sciatica.  I have discussed with the patient starting on a steroid taper muscle relaxer for which she is agreeable to.  Have also offered an IM Toradol injection but she would like to hold off for now.  I believe she is in too much pain for physical therapy.  If her symptoms do not improve or worsen over the next few weeks she will let us know.  Call with concerns or questions. ? ?Follow-Up Instructions: Return if symptoms worsen or fail to improve.  ? ?Orders:  ?Orders Placed This Encounter  ?Procedures  ? XR Lumbar Spine 2-3 Views  ? ?Meds ordered this encounter  ?Medications  ? predniSONE (STERAPRED UNI-PAK 21 TAB) 10 MG (21) TBPK tablet  ?  Sig: Take as directed  ?  Dispense:  21 tablet  ?  Refill:  0  ? methocarbamol (ROBAXIN) 750 MG tablet  ?  Sig: Take 1 tablet (750 mg total) by mouth 2 (two) times daily as needed for muscle spasms.  ?  Dispense:  20 tablet  ?  Refill:  2  ? ? ? ? Procedures: ?No procedures performed ? ? ?Clinical Data: ?No additional findings. ? ? ?Subjective: ?Chief Complaint  ?Patient presents with  ? Lower Back - Pain  ? ? ?HPI patient is a very pleasant 51 year old female who works in short stay at [employer] who comes in today with chronic left-sided low back pain with radiation to the left buttock and down the left leg for the past month.  She denies any specific injury but does note she was cleaning her house when she had to move a vanity for which she thinks began the onset of her symptoms.  The pain is worse when she is lying down as well as when she first gets  up.  She denies any weakness to the left lower extremity.  She has been taking Advil and Tylenol without significant relief.  No paresthesias to either lower extremity.  She denies any bowel or bladder change. ? ?Review of Systems as detailed in HPI.  All others reviewed and are negative. ? ? ?Objective: ?Vital Signs: Ht 5\' 2"  (1.575 m)   Wt 160 lb (72.6 kg)   BMI 29.26 kg/m?  ? ?Physical Exam well-developed well-nourished female no acute distress.  Alert and oriented x3. ? ?Ortho Exam lumbar spine exam shows no spinous tenderness.  She does have left-sided paraspinous tenderness.  Markedly positive straight leg raise.  Pain with lumbar extension.  No focal weakness.  She is neurovascular intact distally. ? ?Specialty Comments:  ?No specialty comments available. ? ?Imaging: ?XR Lumbar Spine 2-3 Views ? ?Result Date: 07/18/2021 ?X-rays demonstrate anterolisthesis and degenerative changes L5-S1  ? ? ?PMFS History: ?Patient Active Problem List  ? Diagnosis Date Noted  ? Chronic pruritic rash in adult 09/30/2020  ? Lactose intolerance 06/01/2020  ? Other fatigue 06/01/2020  ? SOBOE (shortness of breath on exertion) 06/01/2020  ?  Elevated blood pressure reading 06/01/2020  ? At risk for impaired metabolic function 06/01/2020  ? Class 1 obesity with body mass index (BMI) of 30.0 to 30.9 in adult 07/07/2019  ? GOITER, UNSPECIFIED 04/28/2007  ? DIZZINESS 04/28/2007  ? TONSILLAR HYPERTROPHY, UNILATERAL 08/27/2006  ? SARCOIDOSIS 04/03/2006  ? ANEMIA NOS 04/03/2006  ? TOBACCO ABUSE 04/03/2006  ? HEADACHE, TENSION 04/03/2006  ? DISORDER, DEPRESSIVE NEC 04/03/2006  ? HIATAL HERNIA WITH REFLUX 04/03/2006  ? EPICONDYLITIS, LATERAL 04/03/2006  ? INSOMNIA 04/03/2006  ? HELICOBACTER PYLORI GASTRITIS, HX OF 04/03/2006  ? ?Past Medical History:  ?Diagnosis Date  ? Anemia   ? Arthritis   ? Complication of anesthesia   ? Herpes genitalis in women 2010  ? Lactose intolerance   ? MRSA infection greater than 3 months ago 2009  ? PONV  (postoperative nausea and vomiting)   ? Sarcoid 1993  ? in remission per pt  ? SOBOE (shortness of breath on exertion)   ?  ?Family History  ?Problem Relation Age of Onset  ? Other Mother   ? Cancer Mother   ? Hypertension Father   ? Stroke Father   ? Alcoholism Father   ? Colon cancer Neg Hx   ? Colon polyps Neg Hx   ? Esophageal cancer Neg Hx   ? Rectal cancer Neg Hx   ? Stomach cancer Neg Hx   ?  ?Past Surgical History:  ?Procedure Laterality Date  ? CALCANEAL OSTEOTOMY Left 07/30/2019  ? Procedure: CALCANEAL OSTEOTOMY;  Surgeon: Toni Arthurs, MD;  Location: [facility];  Service: Orthopedics;  Laterality: Left;  ? GASTROC RECESSION EXTREMITY Left 07/30/2019  ? Procedure: LEFT GASTROC RECESSION;  Surgeon: Toni Arthurs, MD;  Location: [facility];  Service: Orthopedics;  Laterality: Left;  ? LYMPH NODE BIOPSY  1993  ? POSTERIOR TIBIAL TENDON REPAIR Left 07/30/2019  ? Procedure: EXCISION  LEFT ACCESSSORY NAVICULAR, POSTERIOR TIBIAL TENDON RECONSTRUCTION;  Surgeon: Toni Arthurs, MD;  Location: [facility];  Service: Orthopedics;  Laterality: Left;  ? TUBAL LIGATION  1988  ? ?Social History  ? ?Occupational History  ? Occupation: Psychologist, sport and exercise  ?Tobacco Use  ? Smoking status: Some Days  ?  Packs/day: 0.25  ?  Types: Cigarettes  ? Smokeless tobacco: Never  ? Tobacco comments:  ?  6 cig/day  ?Vaping Use  ? Vaping Use: Every day  ? Substances: Nicotine, Flavoring  ?Substance and Sexual Activity  ? Alcohol use: Yes  ?  Alcohol/week: 6.0 standard drinks  ?  Types: 6 Cans of beer per week  ?  Comment: 1-2 beer   ? Drug use: No  ? Sexual activity: Yes  ?  Birth control/protection: None, Surgical  ? ? ? ? ? ? ?

## 2021-07-25 ENCOUNTER — Ambulatory Visit: Payer: No Typology Code available for payment source | Admitting: Orthopaedic Surgery

## 2021-09-06 ENCOUNTER — Other Ambulatory Visit (HOSPITAL_COMMUNITY): Payer: Self-pay

## 2021-09-15 ENCOUNTER — Other Ambulatory Visit (HOSPITAL_COMMUNITY): Payer: Self-pay

## 2021-09-22 NOTE — Progress Notes (Unsigned)
HPI: [name] is a 51 y.o. female, who is here today for her routine physical.  Last CPE: 07/27/20  She does not have an exercise routine but walking about 10,000 steps at work. She cooks some and eats out sometimes. Cooks fried chicken,tacos,spaghetti.  Chronic medical problems: Back pain,tobacco use disorder, headaches,and eczema (follows with dermatologist) among some.  Immunization History  Administered Date(s) Administered   Influenza-Unspecified 12/30/2020   Moderna Sars-Covid-2 Vaccination 09/26/2019, 10/17/2019   PFIZER(Purple Top)SARS-COV-2 Vaccination 10/17/2019   Pneumococcal Polysaccharide-23 09/25/2021   Td 03/05/2006   Tdap 07/27/2020   Zoster Recombinat (Shingrix) 12/30/2020, 03/13/2021   Health Maintenance  Topic Date Due   COVID-19 Vaccine (4 - Mixed Product series) 10/11/2021 (Originally 12/12/2019)   INFLUENZA VACCINE  10/03/2021   MAMMOGRAM  12/13/2021   PAP SMEAR-Modifier  10/24/2025   COLONOSCOPY (Pts 45-33yrs Insurance coverage will need to be confirmed)  11/04/2028   TETANUS/TDAP  07/28/2030   Hepatitis C Screening  Completed   HIV Screening  Completed   Zoster Vaccines- Shingrix  Completed   HPV VACCINES  Aged Out   She is established with gyn, Dr Chestine Spore.  Goiter:TSH in 05/2020 was 0.4. She has not noted abnormal wt loss,palpitation,diarrhea,or tremor. Lab Results  Component Value Date   CREATININE 0.61 06/01/2020   BUN 10 06/01/2020   NA 140 06/01/2020   K 4.4 06/01/2020   CL 103 06/01/2020   CO2 16 (L) 06/01/2020   Lab Results  Component Value Date   CHOL 184 06/01/2020   HDL 64 06/01/2020   LDLCALC 103 (H) 06/01/2020   TRIG 94 06/01/2020   CHOLHDL 2.9 06/01/2020   Lab Results  Component Value Date   HGBA1C 5.1 06/01/2020   Today she is c/o lower back pain radiated to LLE. It is severe since yesterday am, no new symptoms. She is following with ortho. She took last Ibuprofen yesterday at 2 pm. Requesting Toradol injection,  which has helped before.  Tobacco use: She has tried Nicotine Patches, irritates skin.   Review of Systems  Constitutional:  Positive for fatigue. Negative for activity change, appetite change and fever.  HENT:  Negative for hearing loss, mouth sores, sore throat and trouble swallowing.   Eyes:  Negative for redness and visual disturbance.  Respiratory:  Negative for cough, shortness of breath and wheezing.   Cardiovascular:  Negative for chest pain and leg swelling.  Gastrointestinal:  Negative for abdominal pain, nausea and vomiting.       No changes in bowel habits.  Endocrine: Negative for cold intolerance, heat intolerance, polydipsia, polyphagia and polyuria.  Genitourinary:  Negative for decreased urine volume, dysuria, hematuria, vaginal bleeding and vaginal discharge.  Musculoskeletal:  Positive for arthralgias and back pain.  Skin:  Negative for color change and rash.  Allergic/Immunologic: Negative for environmental allergies.  Neurological:  Negative for seizures, syncope, weakness and headaches.  Hematological:  Negative for adenopathy. Does not bruise/bleed easily.  Psychiatric/Behavioral:  Negative for confusion and hallucinations.   All other systems reviewed and are negative.  Current Outpatient Medications on File Prior to Visit  Medication Sig Dispense Refill   estradiol (VIVELLE-DOT) 0.05 MG/24HR patch Place 1 patch (0.05 mg total) onto the skin 2 (two) times a week. 8 patch 0   estradiol (VIVELLE-DOT) 0.075 MG/24HR Place 1 patch onto the skin 2 (two) times a week. 8 patch 1   methocarbamol (ROBAXIN) 750 MG tablet Take 1 tablet (750 mg total) by mouth 2 (two) times daily as needed  for muscle spasms. 20 tablet 2   Multiple Vitamins-Minerals (MULTIVITAMIN WITH MINERALS) tablet Take by mouth.     Omega-3 Fatty Acids (FISH OIL PO) Take by mouth.     progesterone (PROMETRIUM) 100 MG capsule Take 1 capsule (100 mg total) by mouth daily. 90 capsule 3   triamcinolone cream  (KENALOG) 0.1 % Apply a small amount to affected area once a day as needed 80 g 3   valACYclovir (VALTREX) 500 MG tablet Take 1 tablet (500 mg total) by mouth daily. 90 tablet 3   [DISCONTINUED] PARoxetine (PAXIL) 10 MG tablet Take 1 tablet (10 mg total) by mouth daily. 30 tablet 1   [DISCONTINUED] venlafaxine XR (EFFEXOR XR) 37.5 MG 24 hr capsule Take 1 capsule (37.5 mg total) by mouth daily with breakfast. 30 capsule 1   No current facility-administered medications on file prior to visit.   Past Medical History:  Diagnosis Date   Anemia    Arthritis    Complication of anesthesia    Herpes genitalis in women 2010   Lactose intolerance    MRSA infection greater than 3 months ago 2009   PONV (postoperative nausea and vomiting)    Sarcoid 1993   in remission per pt   SOBOE (shortness of breath on exertion)    Past Surgical History:  Procedure Laterality Date   CALCANEAL OSTEOTOMY Left 07/30/2019   Procedure: CALCANEAL OSTEOTOMY;  Surgeon: Toni Arthurs, MD;  Location: [facility];  Service: Orthopedics;  Laterality: Left;   GASTROC RECESSION EXTREMITY Left 07/30/2019   Procedure: LEFT GASTROC RECESSION;  Surgeon: Toni Arthurs, MD;  Location: [facility];  Service: Orthopedics;  Laterality: Left;   LYMPH NODE BIOPSY  1993   POSTERIOR TIBIAL TENDON REPAIR Left 07/30/2019   Procedure: EXCISION  LEFT ACCESSSORY NAVICULAR, POSTERIOR TIBIAL TENDON RECONSTRUCTION;  Surgeon: Toni Arthurs, MD;  Location: [facility];  Service: Orthopedics;  Laterality: Left;   TUBAL LIGATION  1988   Allergies  Allergen Reactions   Latex Hives and Itching   Amoxicillin Hives   Codeine Nausea And Vomiting   Penicillins Hives    REACTION: hives   Family History  Problem Relation Age of Onset   Other Mother    Cancer Mother    Hypertension Father    Stroke Father    Alcoholism Father    Colon cancer Neg Hx    Colon polyps Neg Hx    Esophageal cancer Neg Hx     Rectal cancer Neg Hx    Stomach cancer Neg Hx    Social History   Socioeconomic History   Marital status: Single    Spouse name: Not on file   Number of children: 2   Years of education: Not on file   Highest education level: GED or equivalent  Occupational History   Occupation: Psychologist, sport and exercise  Tobacco Use   Smoking status: Some Days    Packs/day: 0.50    Types: Cigarettes    Start date: 07/02/1984   Smokeless tobacco: Never   Tobacco comments:    10 cig/day  Vaping Use   Vaping Use: Every day   Substances: Nicotine, Flavoring  Substance and Sexual Activity   Alcohol use: Yes    Alcohol/week: 6.0 standard drinks of alcohol    Types: 6 Cans of beer per week    Comment: 1-2 beer    Drug use: No   Sexual activity: Yes    Birth control/protection: None, Surgical  Other Topics  Concern   Not on file  Social History Narrative   Not on file   Social Determinants of Health   Financial Resource Strain: Not on file  Food Insecurity: Not on file  Transportation Needs: Not on file  Physical Activity: Not on file  Stress: Not on file  Social Connections: Not on file   Vitals:   09/25/21 1052  BP: 136/80  Pulse: 68  Resp: 12  Temp: 99.1 F (37.3 C)  SpO2: 99%  Body mass index is 31.37 kg/m. Wt Readings from Last 3 Encounters:  09/25/21 171 lb 8 oz (77.8 kg)  07/18/21 160 lb (72.6 kg)  09/30/20 165 lb (74.8 kg)   Physical Exam Vitals and nursing note reviewed.  Constitutional:      General: She is not in acute distress.    Appearance: She is well-developed.  HENT:     Head: Normocephalic and atraumatic.     Right Ear: Hearing, tympanic membrane, ear canal and external ear normal.     Left Ear: Hearing, tympanic membrane, ear canal and external ear normal.     Mouth/Throat:     Mouth: Mucous membranes are moist.     Pharynx: Oropharynx is clear. Uvula midline.  Eyes:     Extraocular Movements: Extraocular movements intact.     Conjunctiva/sclera: Conjunctivae  normal.     Pupils: Pupils are equal, round, and reactive to light.  Neck:     Thyroid: No thyroid mass.     Trachea: No tracheal deviation.  Cardiovascular:     Rate and Rhythm: Normal rate and regular rhythm.     Pulses:          Dorsalis pedis pulses are 2+ on the right side and 2+ on the left side.     Heart sounds: No murmur heard. Pulmonary:     Effort: Pulmonary effort is normal. No respiratory distress.     Breath sounds: Normal breath sounds.  Abdominal:     Palpations: Abdomen is soft. There is no hepatomegaly or mass.     Tenderness: There is no abdominal tenderness.  Genitourinary:    Comments: Deferred to gyn. Musculoskeletal:     Comments: No signs of synovitis appreciated. Back pain with movement on examination table.  Lymphadenopathy:     Cervical: No cervical adenopathy.     Upper Body:     Right upper body: No supraclavicular adenopathy.     Left upper body: No supraclavicular adenopathy.  Skin:    General: Skin is warm.     Findings: No erythema or rash.  Neurological:     General: No focal deficit present.     Mental Status: She is alert and oriented to person, place, and time.     Cranial Nerves: No cranial nerve deficit.     Coordination: Coordination normal.     Deep Tendon Reflexes:     Reflex Scores:      Bicep reflexes are 2+ on the right side and 2+ on the left side.      Patellar reflexes are 2+ on the right side and 2+ on the left side.    Comments: Antalgic gait, not assisted.  Psychiatric:     Comments: Well groomed, good eye contact.   ASSESSMENT AND PLAN:  [name] was here today annual physical examination.  Orders Placed This Encounter  Procedures   Pneumococcal polysaccharide vaccine 23-valent greater than or equal to 2yo subcutaneous/IM   Hep C Antibody   Basic metabolic  panel   T4, free   TSH   Lipid panel   Hemoglobin A1c   Lab Results  Component Value Date   HGBA1C 5.0 09/25/2021   Lab Results  Component  Value Date   CREATININE 0.76 09/25/2021   BUN 7 09/25/2021   NA 138 09/25/2021   K 4.0 09/25/2021   CL 105 09/25/2021   CO2 27 09/25/2021   Lab Results  Component Value Date   TSH 1.80 09/25/2021   Lab Results  Component Value Date   CHOL 190 09/25/2021   HDL 54.70 09/25/2021   LDLCALC 103 (H) 09/25/2021   TRIG 162.0 (H) 09/25/2021   CHOLHDL 3 09/25/2021   Routine general medical examination at a health care facility We discussed the importance of regular physical activity and healthy diet for prevention of chronic illness and/or complications. Preventive guidelines reviewed. Vaccination updated. Continue her female preventive care with her gyn. Next CPE in a year. The 10-year ASCVD risk score (Arnett DK, et al., 2019) is: 6.2%   Values used to calculate the score:     Age: 20 years     Sex: Female     Is Non-Hispanic African American: Yes     Diabetic: No     Tobacco smoker: Yes     Systolic Blood Pressure: 136 mmHg     Is BP treated: No     HDL Cholesterol: 54.7 mg/dL     Total Cholesterol: 190 mg/dL  Screening for endocrine, metabolic, and immunity disorder -     Hemoglobin A1c -     Basic metabolic panel  Screening for lipid disorders -     Lipid panel  Encounter for hepatitis C screening test for low risk patient -     Hep C Antibody  Left-sided low back pain with left-sided sciatica, unspecified chronicity Here in the office after verbal consent, she received Toradol 60 mg IM. Pain improved. Resume Ibuprofen after 8 hours. Continue following with ortho.  -     ketorolac (TORADOL) injection 60 mg  TOBACCO ABUSE Adverse effects of tobacco use and benefits of smoking cessation discussed. She is interested in quitting smoking. She agrees with trying Chantix.  -     varenicline (CHANTIX) 0.5 MG tablet; Take 1 tablet (0.5 mg total) by mouth daily for 3 days, THEN 1 tablet (0.5 mg total) 2 (two) times daily for 4 days. -     varenicline (CHANTIX CONTINUING  MONTH PAK) 1 MG tablet; Take 1 tablet (1 mg total) by mouth 2 (two) times daily. -     Pneumococcal polysaccharide vaccine 23-valent greater than or equal to 2yo subcutaneous/IM  Low serum thyroid stimulating hormone (TSH) -     TSH -     T4, free  Need for pneumococcal vaccination -     Pneumococcal polysaccharide vaccine 23-valent greater than or equal to 2yo subcutaneous/IM  Return in 1 year (on 09/26/2022) for CPE.  Latavius Capizzi G. [place], MD  [facility].

## 2021-09-25 ENCOUNTER — Other Ambulatory Visit (HOSPITAL_COMMUNITY): Payer: Self-pay

## 2021-09-25 ENCOUNTER — Encounter: Payer: Self-pay | Admitting: Family Medicine

## 2021-09-25 ENCOUNTER — Ambulatory Visit (INDEPENDENT_AMBULATORY_CARE_PROVIDER_SITE_OTHER): Payer: No Typology Code available for payment source | Admitting: Family Medicine

## 2021-09-25 ENCOUNTER — Other Ambulatory Visit: Payer: Self-pay

## 2021-09-25 VITALS — BP 136/80 | HR 68 | Temp 99.1°F | Resp 12 | Ht 62.0 in | Wt 171.5 lb

## 2021-09-25 DIAGNOSIS — Z0001 Encounter for general adult medical examination with abnormal findings: Secondary | ICD-10-CM

## 2021-09-25 DIAGNOSIS — Z13220 Encounter for screening for lipoid disorders: Secondary | ICD-10-CM | POA: Diagnosis not present

## 2021-09-25 DIAGNOSIS — Z1159 Encounter for screening for other viral diseases: Secondary | ICD-10-CM | POA: Diagnosis not present

## 2021-09-25 DIAGNOSIS — Z13228 Encounter for screening for other metabolic disorders: Secondary | ICD-10-CM | POA: Diagnosis not present

## 2021-09-25 DIAGNOSIS — Z1329 Encounter for screening for other suspected endocrine disorder: Secondary | ICD-10-CM

## 2021-09-25 DIAGNOSIS — Z0000 Encounter for general adult medical examination without abnormal findings: Secondary | ICD-10-CM

## 2021-09-25 DIAGNOSIS — M5442 Lumbago with sciatica, left side: Secondary | ICD-10-CM | POA: Diagnosis not present

## 2021-09-25 DIAGNOSIS — Z130 Encounter for screening for diseases of the blood and blood-forming organs and certain disorders involving the immune mechanism: Secondary | ICD-10-CM | POA: Diagnosis not present

## 2021-09-25 DIAGNOSIS — Z23 Encounter for immunization: Secondary | ICD-10-CM

## 2021-09-25 DIAGNOSIS — R7989 Other specified abnormal findings of blood chemistry: Secondary | ICD-10-CM

## 2021-09-25 DIAGNOSIS — F17200 Nicotine dependence, unspecified, uncomplicated: Secondary | ICD-10-CM

## 2021-09-25 LAB — LIPID PANEL
Cholesterol: 190 mg/dL (ref 0–200)
HDL: 54.7 mg/dL (ref >39.00)
LDL Cholesterol: 103 mg/dL — ABNORMAL HIGH (ref 0–99)
NonHDL: 135.53
Total CHOL/HDL Ratio: 3
Triglycerides: 162 mg/dL — ABNORMAL HIGH (ref 0.0–149.0)
VLDL: 32.4 mg/dL (ref 0.0–40.0)

## 2021-09-25 LAB — T4, FREE: Free T4: 0.6 ng/dL (ref 0.60–1.60)

## 2021-09-25 LAB — HEMOGLOBIN A1C: Hgb A1c MFr Bld: 5 % (ref 4.6–6.5)

## 2021-09-25 LAB — BASIC METABOLIC PANEL
BUN: 7 mg/dL (ref 6–23)
CO2: 27 mEq/L (ref 19–32)
Calcium: 9.3 mg/dL (ref 8.4–10.5)
Chloride: 105 mEq/L (ref 96–112)
Creatinine, Ser: 0.76 mg/dL (ref 0.40–1.20)
GFR: 89.58 mL/min (ref >60.00)
Glucose, Bld: 92 mg/dL (ref 70–99)
Potassium: 4 mEq/L (ref 3.5–5.1)
Sodium: 138 mEq/L (ref 135–145)

## 2021-09-25 LAB — TSH: TSH: 1.8 u[IU]/mL (ref 0.35–5.50)

## 2021-09-25 MED ORDER — VARENICLINE TARTRATE 1 MG PO TABS
1.0000 mg | ORAL_TABLET | Freq: Two times a day (BID) | ORAL | 0 refills | Status: AC
  Filled 2021-09-25: qty 56, 28d supply, fill #0

## 2021-09-25 MED ORDER — VARENICLINE TARTRATE 0.5 MG PO TABS
ORAL_TABLET | ORAL | 0 refills | Status: AC
  Filled 2021-09-25: qty 11, 7d supply, fill #0

## 2021-09-25 MED ORDER — KETOROLAC TROMETHAMINE 60 MG/2ML IM SOLN
60.0000 mg | Freq: Once | INTRAMUSCULAR | Status: AC
  Administered 2021-09-25: 60 mg via INTRAMUSCULAR

## 2021-09-25 NOTE — Patient Instructions (Addendum)
A few things to remember from today's visit:  Routine general medical examination at a health care facility  Screening for endocrine, metabolic, and immunity disorder - Plan: Basic metabolic panel, Hemoglobin A1c  Screening for lipid disorders - Plan: Lipid panel  Encounter for hepatitis C screening test for low risk patient - Plan: Hep C Antibody  Left-sided low back pain with left-sided sciatica, unspecified chronicity - Plan: ketorolac (TORADOL) injection 60 mg  TOBACCO ABUSE - Plan: varenicline (CHANTIX) 0.5 MG tablet, varenicline (CHANTIX CONTINUING MONTH PAK) 1 MG tablet  Low serum thyroid stimulating hormone (TSH) - Plan: T4, free, TSH  Continue following with ortho for back pain. Today you received Toradol 60 mg, avoid pain medication for 8 hours.  Do not use My Chart to request refills or for acute issues that need immediate attention.   Please be sure medication list is accurate. If a new problem present, please set up appointment sooner than planned today.  Health Maintenance, Female Adopting a healthy lifestyle and getting preventive care are important in promoting health and wellness. Ask your health care provider about: The right schedule for you to have regular tests and exams. Things you can do on your own to prevent diseases and keep yourself healthy. What should I know about diet, weight, and exercise? Eat a healthy diet  Eat a diet that includes plenty of vegetables, fruits, low-fat dairy products, and lean protein. Do not eat a lot of foods that are high in solid fats, added sugars, or sodium. Maintain a healthy weight Body mass index (BMI) is used to identify weight problems. It estimates body fat based on height and weight. Your health care provider can help determine your BMI and help you achieve or maintain a healthy weight. Get regular exercise Get regular exercise. This is one of the most important things you can do for your health. Most adults  should: Exercise for at least 150 minutes each week. The exercise should increase your heart rate and make you sweat (moderate-intensity exercise). Do strengthening exercises at least twice a week. This is in addition to the moderate-intensity exercise. Spend less time sitting. Even light physical activity can be beneficial. Watch cholesterol and blood lipids Have your blood tested for lipids and cholesterol at 52 years of age, then have this test every 5 years. Have your cholesterol levels checked more often if: Your lipid or cholesterol levels are high. You are older than 53 years of age. You are at high risk for heart disease. What should I know about cancer screening? Depending on your health history and family history, you may need to have cancer screening at various ages. This may include screening for: Breast cancer. Cervical cancer. Colorectal cancer. Skin cancer. Lung cancer. What should I know about heart disease, diabetes, and high blood pressure? Blood pressure and heart disease High blood pressure causes heart disease and increases the risk of stroke. This is more likely to develop in people who have high blood pressure readings or are overweight. Have your blood pressure checked: Every 3-5 years if you are 34-75 years of age. Every year if you are 81 years old or older. Diabetes Have regular diabetes screenings. This checks your fasting blood sugar level. Have the screening done: Once every three years after age 65 if you are at a normal weight and have a low risk for diabetes. More often and at a younger age if you are overweight or have a high risk for diabetes. What should I know about  preventing infection? Hepatitis B If you have a higher risk for hepatitis B, you should be screened for this virus. Talk with your health care provider to find out if you are at risk for hepatitis B infection. Hepatitis C Testing is recommended for: Everyone born from [birth year] through  1965. Anyone with known risk factors for hepatitis C. Sexually transmitted infections (STIs) Get screened for STIs, including gonorrhea and chlamydia, if: You are sexually active and are younger than 53 years of age. You are older than 53 years of age and your health care provider tells you that you are at risk for this type of infection. Your sexual activity has changed since you were last screened, and you are at increased risk for chlamydia or gonorrhea. Ask your health care provider if you are at risk. Ask your health care provider about whether you are at high risk for HIV. Your health care provider may recommend a prescription medicine to help prevent HIV infection. If you choose to take medicine to prevent HIV, you should first get tested for HIV. You should then be tested every 3 months for as long as you are taking the medicine. Pregnancy If you are about to stop having your period (premenopausal) and you may become pregnant, seek counseling before you get pregnant. Take 400 to 800 micrograms (mcg) of folic acid every day if you become pregnant. Ask for birth control (contraception) if you want to prevent pregnancy. Osteoporosis and menopause Osteoporosis is a disease in which the bones lose minerals and strength with aging. This can result in bone fractures. If you are 4 years old or older, or if you are at risk for osteoporosis and fractures, ask your health care provider if you should: Be screened for bone loss. Take a calcium or vitamin D supplement to lower your risk of fractures. Be given hormone replacement therapy (HRT) to treat symptoms of menopause. Follow these instructions at home: Alcohol use Do not drink alcohol if: Your health care provider tells you not to drink. You are pregnant, may be pregnant, or are planning to become pregnant. If you drink alcohol: Limit how much you have to: 0-1 drink a day. Know how much alcohol is in your drink. In the [place] one drink  equals one 12 oz bottle of beer (355 mL), one 5 oz glass of wine (148 mL), or one 1 oz glass of hard liquor (44 mL). Lifestyle Do not use any products that contain nicotine or tobacco. These products include cigarettes, chewing tobacco, and vaping devices, such as e-cigarettes. If you need help quitting, ask your health care provider. Do not use street drugs. Do not share needles. Ask your health care provider for help if you need support or information about quitting drugs. General instructions Schedule regular health, dental, and eye exams. Stay current with your vaccines. Tell your health care provider if: You often feel depressed. You have ever been abused or do not feel safe at home. Summary Adopting a healthy lifestyle and getting preventive care are important in promoting health and wellness. Follow your health care provider's instructions about healthy diet, exercising, and getting tested or screened for diseases. Follow your health care provider's instructions on monitoring your cholesterol and blood pressure. This information is not intended to replace advice given to you by your health care provider. Make sure you discuss any questions you have with your health care provider. Document Revised: 07/11/2020 Document Reviewed: 07/11/2020 Elsevier Patient Education  2023 [employer].

## 2021-09-26 LAB — HEPATITIS C ANTIBODY: Hepatitis C Ab: NONREACTIVE

## 2021-10-03 ENCOUNTER — Other Ambulatory Visit (HOSPITAL_COMMUNITY): Payer: Self-pay

## 2021-10-03 ENCOUNTER — Ambulatory Visit (INDEPENDENT_AMBULATORY_CARE_PROVIDER_SITE_OTHER): Payer: No Typology Code available for payment source | Admitting: Orthopaedic Surgery

## 2021-10-03 ENCOUNTER — Encounter: Payer: Self-pay | Admitting: Orthopaedic Surgery

## 2021-10-03 DIAGNOSIS — M5442 Lumbago with sciatica, left side: Secondary | ICD-10-CM | POA: Diagnosis not present

## 2021-10-03 DIAGNOSIS — G8929 Other chronic pain: Secondary | ICD-10-CM

## 2021-10-03 MED ORDER — METHOCARBAMOL 750 MG PO TABS
750.0000 mg | ORAL_TABLET | Freq: Two times a day (BID) | ORAL | 2 refills | Status: DC | PRN
  Filled 2021-10-03: qty 20, 10d supply, fill #0

## 2021-10-03 MED ORDER — TRAMADOL HCL 50 MG PO TABS
50.0000 mg | ORAL_TABLET | Freq: Two times a day (BID) | ORAL | 0 refills | Status: DC | PRN
  Filled 2021-10-03: qty 30, 15d supply, fill #0

## 2021-10-03 MED ORDER — PREDNISONE 10 MG (21) PO TBPK
ORAL_TABLET | ORAL | 0 refills | Status: DC
  Filled 2021-10-03: qty 21, 6d supply, fill #0

## 2021-10-03 NOTE — Progress Notes (Signed)
Office Visit Note   Patient: Kathleen Vang           Date of Birth: March 29, 1970           MRN: [id number] Visit Date: 10/03/2021              Requested by: [place], Betty G, MD [address] [place],  [place] [address] PCP: [place], Betty G, MD   Assessment & Plan: Visit Diagnoses:  1. Chronic left-sided low back pain with left-sided sciatica     Plan: Impression is chronic left low back pain with left lower extremity radiculopathy.  The patient has been dealing with significant pain for several months despite prescription medication as well as 2 months of a guided home exercise program.  I would like to go ahead and get an MRI to assess for structural abnormalities.  We will call her with the results.  In the meantime, have sent in a new prescription of steroids and muscle relaxers.  Follow-Up Instructions: Return if symptoms worsen or fail to improve.   Orders:  No orders of the defined types were placed in this encounter.  Meds ordered this encounter  Medications   methocarbamol (ROBAXIN) 750 MG tablet    Sig: Take 1 tablet (750 mg total) by mouth 2 (two) times daily as needed for muscle spasms.    Dispense:  20 tablet    Refill:  2   predniSONE (STERAPRED UNI-PAK 21 TAB) 10 MG (21) TBPK tablet    Sig: Take as directed    Dispense:  21 tablet    Refill:  0   traMADol (ULTRAM) 50 MG tablet    Sig: Take 1 tablet (50 mg total) by mouth every 12 (twelve) hours as needed.    Dispense:  30 tablet    Refill:  0      Procedures: No procedures performed   Clinical Data: No additional findings.   Subjective: Chief Complaint  Patient presents with   Lower Back - Pain    HPI patient is a pleasant 51 year old female who comes in today with with recurrent left low back pain left lower extremity radiculopathy.  This began back in April after she was moving furniture.  She was started on a steroid pack and muscle relaxer which temporarily helped.  She also did a course  of guided home exercises for over 2 months which did not help.  She is now having recurrent pain which has progressively worsened.  Pain is constant but worse with lumbar flexion as well as flexion of her knee.  She does note paresthesias to the left lower extremity.  Review of Systems as detailed in HPI.  All others reviewed and are negative.   Objective: Vital Signs: There were no vitals taken for this visit.  Physical Exam well-developed and well-nourished female in no acute distress.  Alert and oriented x3.  Ortho Exam examination of the lumbar spine is unchanged.  Specialty Comments:  No specialty comments available.  Imaging: No new imaging   PMFS History: Patient Active Problem List   Diagnosis Date Noted   Chronic pruritic rash in adult 09/30/2020   Lactose intolerance 06/01/2020   Other fatigue 06/01/2020   SOBOE (shortness of breath on exertion) 06/01/2020   Elevated blood pressure reading 06/01/2020   At risk for impaired metabolic function 06/01/2020   Class 1 obesity with body mass index (BMI) of 30.0 to 30.9 in adult 07/07/2019   GOITER, UNSPECIFIED 04/28/2007   DIZZINESS 04/28/2007  TONSILLAR HYPERTROPHY, UNILATERAL 08/27/2006   SARCOIDOSIS 04/03/2006   ANEMIA NOS 04/03/2006   TOBACCO ABUSE 04/03/2006   HEADACHE, TENSION 04/03/2006   DISORDER, DEPRESSIVE NEC 04/03/2006   HIATAL HERNIA WITH REFLUX 04/03/2006   EPICONDYLITIS, LATERAL 04/03/2006   INSOMNIA 04/03/2006   HELICOBACTER PYLORI GASTRITIS, HX OF 04/03/2006   Past Medical History:  Diagnosis Date   Anemia    Arthritis    Complication of anesthesia    Herpes genitalis in women 2010   Lactose intolerance    MRSA infection greater than 3 months ago 2009   PONV (postoperative nausea and vomiting)    Sarcoid 1993   in remission per pt   SOBOE (shortness of breath on exertion)     Family History  Problem Relation Age of Onset   Other Mother    Cancer Mother    Hypertension Father    Stroke  Father    Alcoholism Father    Colon cancer Neg Hx    Colon polyps Neg Hx    Esophageal cancer Neg Hx    Rectal cancer Neg Hx    Stomach cancer Neg Hx     Past Surgical History:  Procedure Laterality Date   CALCANEAL OSTEOTOMY Left 07/30/2019   Procedure: CALCANEAL OSTEOTOMY;  Surgeon: Toni Arthurs, MD;  Location: [facility];  Service: Orthopedics;  Laterality: Left;   GASTROC RECESSION EXTREMITY Left 07/30/2019   Procedure: LEFT GASTROC RECESSION;  Surgeon: Toni Arthurs, MD;  Location: [facility];  Service: Orthopedics;  Laterality: Left;   LYMPH NODE BIOPSY  1993   POSTERIOR TIBIAL TENDON REPAIR Left 07/30/2019   Procedure: EXCISION  LEFT ACCESSSORY NAVICULAR, POSTERIOR TIBIAL TENDON RECONSTRUCTION;  Surgeon: Toni Arthurs, MD;  Location: [facility];  Service: Orthopedics;  Laterality: Left;   TUBAL LIGATION  1988   Social History   Occupational History   Occupation: Psychologist, sport and exercise  Tobacco Use   Smoking status: Some Days    Packs/day: 0.50    Types: Cigarettes    Start date: 07/02/1984   Smokeless tobacco: Never   Tobacco comments:    10 cig/day  Vaping Use   Vaping Use: Every day   Substances: Nicotine, Flavoring  Substance and Sexual Activity   Alcohol use: Yes    Alcohol/week: 6.0 standard drinks of alcohol    Types: 6 Cans of beer per week    Comment: 1-2 beer    Drug use: No   Sexual activity: Yes    Birth control/protection: None, Surgical

## 2021-10-05 ENCOUNTER — Other Ambulatory Visit: Payer: No Typology Code available for payment source

## 2021-10-06 ENCOUNTER — Ambulatory Visit
Admission: RE | Admit: 2021-10-06 | Discharge: 2021-10-06 | Disposition: A | Discharge status: Home / Self Care | Payer: No Typology Code available for payment source | Source: Ambulatory Visit | Attending: Orthopaedic Surgery | Admitting: Orthopaedic Surgery

## 2021-10-06 DIAGNOSIS — G8929 Other chronic pain: Secondary | ICD-10-CM

## 2021-10-11 ENCOUNTER — Encounter (INDEPENDENT_AMBULATORY_CARE_PROVIDER_SITE_OTHER): Payer: Self-pay

## 2021-10-16 ENCOUNTER — Other Ambulatory Visit (HOSPITAL_COMMUNITY): Payer: Self-pay

## 2021-10-16 ENCOUNTER — Other Ambulatory Visit: Payer: Self-pay | Admitting: Orthopaedic Surgery

## 2021-10-16 DIAGNOSIS — G8929 Other chronic pain: Secondary | ICD-10-CM

## 2021-10-16 MED ORDER — GABAPENTIN 100 MG PO CAPS
100.0000 mg | ORAL_CAPSULE | Freq: Every evening | ORAL | 3 refills | Status: DC | PRN
  Filled 2021-10-16: qty 30, 10d supply, fill #0

## 2021-10-23 ENCOUNTER — Other Ambulatory Visit (HOSPITAL_COMMUNITY): Payer: Self-pay

## 2021-10-24 ENCOUNTER — Other Ambulatory Visit (HOSPITAL_COMMUNITY): Payer: Self-pay

## 2021-10-24 MED ORDER — SODIUM FLUORIDE 1.1 % DT CREA
TOPICAL_CREAM | DENTAL | 1 refills | Status: AC
  Filled 2021-10-24: qty 51, 30d supply, fill #0

## 2021-10-24 NOTE — Therapy (Addendum)
OUTPATIENT PHYSICAL THERAPY THORACOLUMBAR EVALUATION /DISCHARGE   Patient Name: Kathleen Vang MRN: [id number] DOB:[birth year], 51 y.o., female Today's Date: 10/26/2021   PT End of Session - 10/26/21 1435     Visit Number 1    Number of Visits 16    Date for PT Re-Evaluation 10/14/19    Authorization Type [place] [employer]    PT Start Time 3435    Activity Tolerance Patient tolerated treatment well    Behavior During Therapy [facility] for tasks assessed/performed             Past Medical History:  Diagnosis Date   Anemia    Arthritis    Complication of anesthesia    Herpes genitalis in women 2010   Lactose intolerance    MRSA infection greater than 3 months ago 2009   PONV (postoperative nausea and vomiting)    Sarcoid 1993   in remission per pt   SOBOE (shortness of breath on exertion)    Past Surgical History:  Procedure Laterality Date   CALCANEAL OSTEOTOMY Left 07/30/2019   Procedure: CALCANEAL OSTEOTOMY;  Surgeon: Wylene Simmer, MD;  Location: [facility];  Service: Orthopedics;  Laterality: Left;   GASTROC RECESSION EXTREMITY Left 07/30/2019   Procedure: LEFT GASTROC RECESSION;  Surgeon: Wylene Simmer, MD;  Location: [place];  Service: Orthopedics;  Laterality: Left;   LYMPH NODE BIOPSY  1993   POSTERIOR TIBIAL TENDON REPAIR Left 07/30/2019   Procedure: EXCISION  LEFT ACCESSSORY NAVICULAR, POSTERIOR TIBIAL TENDON RECONSTRUCTION;  Surgeon: Wylene Simmer, MD;  Location: [facility];  Service: Orthopedics;  Laterality: Left;   TUBAL LIGATION  1988   Patient Active Problem List   Diagnosis Date Noted   Chronic pruritic rash in adult 09/30/2020   Lactose intolerance 06/01/2020   Other fatigue 06/01/2020   SOBOE (shortness of breath on exertion) 06/01/2020   Elevated blood pressure reading 06/01/2020   At risk for impaired metabolic function 68/61/6837   Class 1 obesity with body mass index (BMI) of 30.0 to 30.9 in adult 07/07/2019    GOITER, UNSPECIFIED 04/28/2007   DIZZINESS 04/28/2007   TONSILLAR HYPERTROPHY, UNILATERAL 08/27/2006   SARCOIDOSIS 04/03/2006   ANEMIA NOS 04/03/2006   TOBACCO ABUSE 04/03/2006   HEADACHE, TENSION 04/03/2006   DISORDER, DEPRESSIVE NEC 04/03/2006   HIATAL HERNIA WITH REFLUX 04/03/2006   EPICONDYLITIS, LATERAL 04/03/2006   INSOMNIA 29/04/1113   HELICOBACTER PYLORI GASTRITIS, HX OF 04/03/2006    PCP: [place], Betty G, MD  REFERRING PROVIDER: Leandrew Koyanagi, MD  REFERRING DIAG: Chronic left-sided low back pain with left-sided sciatica [M54.42, G89.29]  Rationale for Evaluation and Treatment Rehabilitation  THERAPY DIAG:  Other low back pain  Chronic left-sided low back pain with left-sided sciatica  Muscle weakness (generalized)  Other abnormalities of gait and mobility  ONSET DATE: 06/30/2021  SUBJECTIVE:  SUBJECTIVE STATEMENT: Pt is a 51 year old female that presents with chronic L side lower back pain with sciatica. She states that she was trying to move some furniture and used her back instead of her legs resulting in an herniated disc. She states that she has tried muscle relaxer's and exercises but the pain has not resolved.   PERTINENT HISTORY:  Anemia, Arthritis  PAIN:  Are you having pain? Yes: NPRS scale: 3/10 Pain location: L lower back radiating into L LE.  Pain description: N/T, sharp pain with sneezing/ coughing. Dull ache. Aggravating factors: Flexion, standing from a seated position, lifting, twisting  Relieving factors: Heating pad, some meds.    PRECAUTIONS: None  WEIGHT BEARING RESTRICTIONS No  FALLS:  Has patient fallen in last 6 months? 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   PLOF: Independent  PATIENT GOALS Pt would like to get pain relief.    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 4 mm grade 1 anterolisthesis. Slight disc uncovering with disc bulge. Superimposed left center/subarticular disc protrusion. The disc protrusion results in left subarticular stenosis, encroaching upon the descending left S1 nerve root. Correlate for left S1 radiculopathy. Bilateral neural foraminal narrowing (mild right, moderate left).   Small disc bulge at L4-L5.  Sign 4  PATIENT SURVEYS:  FOTO 69, Predicted 77% in 9 visits.   SCREENING FOR RED FLAGS: Bowel or bladder incontinence: No Spinal tumors: No Cauda equina syndrome: No Compression fracture: No Abdominal aneurysm: No  COGNITION:  Overall cognitive status: Within functional limits for tasks assessed     SENSATION: WFL   POSTURE: weight shift right  PALPATION: Pt with tenderness with palpation to L QL.   LUMBAR ROM:   Active  A/PROM  eval  Flexion 50% P!  Extension 100%  Right lateral flexion Knee joint line P!  Left lateral flexion Knee joint  Right rotation 75%  Left rotation 75%    (Blank rows = not tested)  LOWER EXTREMITY ROM:     Active  Right eval Left eval  Hip flexion WFL WFL  Knee flexion WFL WFL  Knee extension WFL WFL   (Blank rows = not tested)  LOWER EXTREMITY MMT:    MMT Right eval Left eval  Hip flexion 5 4  Hip abduction    Knee flexion 5 5  Knee extension 5 4- P!   (Blank rows = not tested)  LUMBAR SPECIAL TESTS:  Slump test: Positive   GAIT: Distance walked: 50ft  Assistive device utilized: None Level of assistance: Complete Independence Comments: Pt walks with reduced stance time on L LE.   TODAY'S TREATMENT  Creating, reviewing,  and completing below HEP   PATIENT EDUCATION:  Education details: Educated pt on anatomy and physiology of current symptoms, FOTO, diagnosis, prognosis, HEP,  and POC. Person educated: Patient Education method: Explanation and Demonstration Education comprehension: verbalized understanding and returned demonstration   HOME EXERCISE PROGRAM: Access Code: [email or web address] URL: [email or web address] Date: 10/26/2021 Prepared by: Simone Yuds  Exercises - Supine Lower Trunk Rotation  - 2 x daily - 7 x weekly - 2 sets - 10 reps - Supine Posterior Pelvic Tilt  - 2 x daily - 7 x weekly - 2 sets -   10 reps - Supine Piriformis Stretch with Foot on Ground  - 2 x daily - 7 x weekly - 2 sets - 10 reps - 30 hold - Seated Sciatic Tensioner  - 2 x daily - 7 x weekly - 2 sets - 10 reps   ASSESSMENT:  CLINICAL IMPRESSION: Patient referred to PT for Chronic left-sided low back pain with left-sided sciatica. Pt sits with R weight shift to offload L hip. She ambulates with decreased stance time on L LE due to reported pain. She has notable tension and pain in her L QL. Pt tolerated all prescribed exercises well today. Patient will benefit from skilled PT to address below impairments, limitations and improve overall function.  OBJECTIVE IMPAIRMENTS: decreased activity tolerance, difficulty walking, decreased balance, decreased endurance, decreased mobility, decreased ROM, decreased strength, impaired flexibility, impaired LE use, postural dysfunction, and pain.  ACTIVITY LIMITATIONS: bending, lifting, carry, locomotion, cleaning, community activity, driving, and or occupation  PERSONAL FACTORS: Anemia, Arthritis are also affecting patient's functional outcome.  REHAB POTENTIAL: Good  CLINICAL DECISION MAKING: Stable/uncomplicated  EVALUATION COMPLEXITY: Low    GOALS: Short term PT Goals Target date: 11/23/2021 Pt will be I and compliant with HEP. Baseline:  Goal status: New  Long term PT  goals Target date: 12/21/2021 Pt will improve ROM to WFL to improve functional mobility Baseline: Goal status: New Pt will improve hip/knee strength to at least 5/5 MMT to improve functional strength. Baseline: Goal status: New Pt will improve FOTO to at least 77% functional to show improved function Baseline: Goal status: New Pt will reduce pain by overall 50% overall with usual activity Baseline: Goal status: New Pt will reduce pain to overall less than 1-2/10 with usual activity and work activity. Baseline: Goal status: New Pt will be able to ambulate community distances at least 1000 ft WNL gait pattern without complaints Baseline: Goal status: New  PLAN: PT FREQUENCY: 1-2 times per week   PT DURATION: 6-8 weeks  PLANNED INTERVENTIONS (unless contraindicated): aquatic PT, Canalith repositioning, cryotherapy, Electrical stimulation, Iontophoresis with 4 mg/ml dexamethasome, Moist heat, traction, Ultrasound, gait training, Therapeutic exercise, balance training, neuromuscular re-education, patient/family education, prosthetic training, manual techniques, passive ROM, dry needling, taping, vasopnuematic device, vestibular, spinal manipulations, joint manipulations   PLAN FOR NEXT SESSION: Assess HEP/update PRN, continue to progress functional mobility, strengthen proximal hip, lumbar and glutes. Decrease patients pain and help minimize functional deficits. TPDN.    Simone R Yuds, PT 10/26/2021, 3:13 PM   PHYSICAL THERAPY DISCHARGE SUMMARY  Visits from Start of Care: 1  Current functional level related to goals / functional outcomes: See note   Remaining deficits: See note   Education / Equipment: HEP  Patient goals were not met. Patient is being discharged due to not returning since the last visit.  Michael Wright, PT, DPT, OCS, ATC 11/27/21  3:48 PM     

## 2021-10-26 ENCOUNTER — Other Ambulatory Visit: Payer: Self-pay

## 2021-10-26 ENCOUNTER — Ambulatory Visit: Payer: No Typology Code available for payment source | Admitting: Physical Therapy

## 2021-10-26 ENCOUNTER — Encounter: Payer: Self-pay | Admitting: Physical Therapy

## 2021-10-26 DIAGNOSIS — G8929 Other chronic pain: Secondary | ICD-10-CM

## 2021-10-26 DIAGNOSIS — M5459 Other low back pain: Secondary | ICD-10-CM

## 2021-10-26 DIAGNOSIS — M6281 Muscle weakness (generalized): Secondary | ICD-10-CM | POA: Diagnosis not present

## 2021-10-26 DIAGNOSIS — M5442 Lumbago with sciatica, left side: Secondary | ICD-10-CM | POA: Diagnosis not present

## 2021-10-26 DIAGNOSIS — R2689 Other abnormalities of gait and mobility: Secondary | ICD-10-CM

## 2021-10-27 ENCOUNTER — Other Ambulatory Visit (HOSPITAL_COMMUNITY): Payer: Self-pay

## 2021-10-27 MED ORDER — VALACYCLOVIR HCL 500 MG PO TABS
500.0000 mg | ORAL_TABLET | Freq: Every day | ORAL | 3 refills | Status: DC
  Filled 2021-10-27 – 2021-12-11 (×2): qty 90, 90d supply, fill #0
  Filled 2022-03-07 – 2022-03-14 (×2): qty 90, 90d supply, fill #1
  Filled 2022-06-12: qty 90, 90d supply, fill #2

## 2021-10-27 MED ORDER — NYSTATIN 100000 UNIT/GM EX POWD
Freq: Two times a day (BID) | CUTANEOUS | 3 refills | Status: AC
  Filled 2021-10-27: qty 60, 30d supply, fill #0

## 2021-11-01 ENCOUNTER — Other Ambulatory Visit (HOSPITAL_COMMUNITY): Payer: Self-pay

## 2021-11-10 ENCOUNTER — Ambulatory Visit (INDEPENDENT_AMBULATORY_CARE_PROVIDER_SITE_OTHER): Payer: No Typology Code available for payment source

## 2021-11-10 DIAGNOSIS — Z23 Encounter for immunization: Secondary | ICD-10-CM

## 2021-11-15 ENCOUNTER — Encounter: Payer: No Typology Code available for payment source | Admitting: Rehabilitative and Restorative Service Providers"

## 2021-11-21 ENCOUNTER — Encounter: Payer: No Typology Code available for payment source | Admitting: Rehabilitative and Restorative Service Providers"

## 2021-11-28 ENCOUNTER — Encounter: Payer: No Typology Code available for payment source | Admitting: Rehabilitative and Restorative Service Providers"

## 2021-12-05 ENCOUNTER — Encounter: Payer: No Typology Code available for payment source | Admitting: Rehabilitative and Restorative Service Providers"

## 2021-12-11 ENCOUNTER — Other Ambulatory Visit (HOSPITAL_COMMUNITY): Payer: Self-pay

## 2022-01-05 ENCOUNTER — Other Ambulatory Visit (HOSPITAL_COMMUNITY): Payer: Self-pay

## 2022-01-08 ENCOUNTER — Other Ambulatory Visit (HOSPITAL_COMMUNITY): Payer: Self-pay

## 2022-01-08 MED ORDER — TRIAMCINOLONE ACETONIDE 0.1 % EX CREA
1.0000 | TOPICAL_CREAM | Freq: Two times a day (BID) | CUTANEOUS | 1 refills | Status: DC
  Filled 2022-01-08: qty 454, 30d supply, fill #0

## 2022-01-09 ENCOUNTER — Other Ambulatory Visit (HOSPITAL_COMMUNITY): Payer: Self-pay

## 2022-01-15 ENCOUNTER — Ambulatory Visit (INDEPENDENT_AMBULATORY_CARE_PROVIDER_SITE_OTHER): Payer: No Typology Code available for payment source | Admitting: Family Medicine

## 2022-01-15 ENCOUNTER — Encounter: Payer: Self-pay | Admitting: Family Medicine

## 2022-01-15 ENCOUNTER — Other Ambulatory Visit (HOSPITAL_COMMUNITY): Payer: Self-pay

## 2022-01-15 VITALS — BP 100/78 | HR 81 | Temp 98.1°F | Wt 168.0 lb

## 2022-01-15 DIAGNOSIS — J029 Acute pharyngitis, unspecified: Secondary | ICD-10-CM | POA: Diagnosis not present

## 2022-01-15 DIAGNOSIS — J019 Acute sinusitis, unspecified: Secondary | ICD-10-CM

## 2022-01-15 LAB — POCT RAPID STREP A (OFFICE): Rapid Strep A Screen: NEGATIVE

## 2022-01-15 LAB — POC COVID19 BINAXNOW: SARS Coronavirus 2 Ag: NEGATIVE

## 2022-01-15 MED ORDER — METHYLPREDNISOLONE ACETATE 80 MG/ML IJ SUSP
80.0000 mg | Freq: Once | INTRAMUSCULAR | Status: AC
  Administered 2022-01-15: 80 mg via INTRAMUSCULAR

## 2022-01-15 MED ORDER — METHYLPREDNISOLONE ACETATE 40 MG/ML IJ SUSP
40.0000 mg | Freq: Once | INTRAMUSCULAR | Status: AC
  Administered 2022-01-15: 40 mg via INTRAMUSCULAR

## 2022-01-15 MED ORDER — CEFUROXIME AXETIL 500 MG PO TABS
500.0000 mg | ORAL_TABLET | Freq: Two times a day (BID) | ORAL | 0 refills | Status: AC
  Filled 2022-01-15: qty 20, 10d supply, fill #0

## 2022-01-15 NOTE — Progress Notes (Signed)
   Subjective:    Patient ID: Kathleen Vang, female    DOB: [birth year], 51 y.o.   MRN: [id number]  HPI Here for 3 days of sinus pressure, ear pain, PND, ST, and dry cough. No NVD. Drinking fluids and taking Ibuprofen.    Review of Systems  Constitutional: Negative.   HENT:  Positive for congestion, ear pain, postnasal drip, sinus pressure and sore throat. Negative for facial swelling.   Eyes: Negative.   Respiratory:  Positive for cough. Negative for shortness of breath and wheezing.        Objective:   Physical Exam Constitutional:      Appearance: Normal appearance. She is not ill-appearing.  HENT:     Right Ear: Tympanic membrane, ear canal and external ear normal.     Left Ear: Tympanic membrane, ear canal and external ear normal.     Nose: Nose normal.     Mouth/Throat:     Pharynx: Oropharynx is clear.  Eyes:     Conjunctiva/sclera: Conjunctivae normal.  Pulmonary:     Effort: Pulmonary effort is normal.     Breath sounds: Normal breath sounds.  Lymphadenopathy:     Cervical: No cervical adenopathy.  Neurological:     Mental Status: She is alert.           Assessment & Plan:  Sinusitis, treat with 10 days of Cefuroxime. Given a shot of DepoMedrol.  Gershon Crane, MD

## 2022-01-15 NOTE — Addendum Note (Signed)
Addended by: Carola Rhine on: 01/15/2022 11:55 AM   Modules accepted: Orders

## 2022-01-18 ENCOUNTER — Telehealth: Payer: Self-pay | Admitting: Family Medicine

## 2022-01-18 NOTE — Telephone Encounter (Signed)
Pt was seen by Dr. Clent Ridges on 01/15/22. Pt called to inform MD that her cough seems to be worsening.  Pt's cough is keeping her up at night. Pt is asking for a prescription to treat her cough.   Please send to:  [facility] Phone: [phone]  Fax: [phone]

## 2022-01-19 ENCOUNTER — Other Ambulatory Visit (HOSPITAL_COMMUNITY): Payer: Self-pay

## 2022-01-19 MED ORDER — HYDROCODONE BIT-HOMATROP MBR 5-1.5 MG/5ML PO SOLN
5.0000 mL | ORAL | 0 refills | Status: DC | PRN
  Filled 2022-01-19: qty 240, 8d supply, fill #0

## 2022-01-19 NOTE — Telephone Encounter (Signed)
Left pt a message to call her pharmacy for her prescription

## 2022-01-19 NOTE — Telephone Encounter (Signed)
Done

## 2022-01-31 ENCOUNTER — Other Ambulatory Visit (HOSPITAL_COMMUNITY): Payer: Self-pay

## 2022-01-31 MED ORDER — ESTRADIOL 1 MG PO TABS
1.0000 mg | ORAL_TABLET | Freq: Every day | ORAL | 3 refills | Status: DC
  Filled 2022-01-31: qty 90, 90d supply, fill #0
  Filled 2022-05-06: qty 90, 90d supply, fill #1
  Filled 2022-08-01: qty 90, 90d supply, fill #2
  Filled 2022-10-29: qty 90, 90d supply, fill #3

## 2022-01-31 MED ORDER — PROGESTERONE MICRONIZED 100 MG PO CAPS
100.0000 mg | ORAL_CAPSULE | Freq: Every day | ORAL | 3 refills | Status: DC
  Filled 2022-01-31: qty 90, 90d supply, fill #0
  Filled 2022-05-06: qty 90, 90d supply, fill #1
  Filled 2022-08-01: qty 90, 90d supply, fill #2
  Filled 2022-10-29: qty 90, 90d supply, fill #3

## 2022-02-01 ENCOUNTER — Other Ambulatory Visit (HOSPITAL_COMMUNITY): Payer: Self-pay

## 2022-03-07 ENCOUNTER — Other Ambulatory Visit (HOSPITAL_COMMUNITY): Payer: Self-pay

## 2022-03-14 ENCOUNTER — Other Ambulatory Visit (HOSPITAL_BASED_OUTPATIENT_CLINIC_OR_DEPARTMENT_OTHER): Payer: Self-pay

## 2022-03-14 ENCOUNTER — Other Ambulatory Visit (HOSPITAL_COMMUNITY): Payer: Self-pay

## 2022-03-16 ENCOUNTER — Other Ambulatory Visit (HOSPITAL_COMMUNITY): Payer: Self-pay

## 2022-03-27 ENCOUNTER — Ambulatory Visit (INDEPENDENT_AMBULATORY_CARE_PROVIDER_SITE_OTHER): Payer: 59

## 2022-03-27 ENCOUNTER — Ambulatory Visit (INDEPENDENT_AMBULATORY_CARE_PROVIDER_SITE_OTHER): Payer: 59 | Admitting: Orthopaedic Surgery

## 2022-03-27 DIAGNOSIS — M25531 Pain in right wrist: Secondary | ICD-10-CM

## 2022-03-27 MED ORDER — METHYLPREDNISOLONE ACETATE 40 MG/ML IJ SUSP
10.0000 mg | INTRAMUSCULAR | Status: AC | PRN
  Administered 2022-03-27: 10 mg

## 2022-03-27 NOTE — Progress Notes (Signed)
Office Visit Note   Patient: Kathleen Vang           Date of Birth: 21-Apr-1970           MRN: [id number] Visit Date: 03/27/2022              Requested by: Martinique, Betty G, MD [address] [place],  [place] [address] PCP: Martinique, Betty G, MD   Assessment & Plan: Visit Diagnoses:  1. Right wrist pain     Plan: Impression is ganglion cyst within the first dorsal compartment.  Injection was performed into the first dorsal wrist compartment.  We will immobilize with a thumb spica brace for couple weeks and then wean as tolerated.  Follow-up as needed.  Follow-Up Instructions: Return if symptoms worsen or fail to improve.   Orders:  Orders Placed This Encounter  Procedures  . Hand/UE Inj: R extensor compartment 1  . XR Wrist Complete Right   No orders of the defined types were placed in this encounter.     Procedures: Hand/UE Inj: R extensor compartment 1 for de Quervain's tenosynovitis on 03/27/2022 2:23 PM Details: 25 G needle, radial approach Medications: 10 mg methylPREDNISolone acetate 40 MG/ML     Clinical Data: No additional findings.   Subjective: Chief Complaint  Patient presents with  . Right Wrist - Pain    HPI patient is a pleasant 52 year old right-hand-dominant female who comes in today with right wrist pain.  She has always noticed pain as well as a small area of swelling to the distal radius which is worsened over the past few weeks.  She denies any injury or change in activity.  No fevers or chills.  Pain she has is primarily over the first dorsal compartment.  This is described as an intermittent ache worse with certain motions of her wrist.  She denies any numbness, tingling or burning.  She denies any autoimmune disease.  Review of Systems as detailed in HPI.  All others reviewed and are negative.   Objective: Vital Signs: There were no vitals taken for this visit.  Physical Exam well-developed well-nourished female no acute distress but  alert and oriented x 3.  Ortho Exam right wrist exam shows a pea-sized tender nodule along the first dorsal compartment.  There are no skin changes.  She does have pain with Wynn Maudlin test.  She is neurovascularly intact distally.  Specialty Comments:  No specialty comments available.  Imaging: XR Wrist Complete Right  Result Date: 03/27/2022 No acute or structural abnormalities.    PMFS History: Patient Active Problem List   Diagnosis Date Noted  . Chronic pruritic rash in adult 09/30/2020  . Lactose intolerance 06/01/2020  . Other fatigue 06/01/2020  . SOBOE (shortness of breath on exertion) 06/01/2020  . Elevated blood pressure reading 06/01/2020  . At risk for impaired metabolic function 70/35/0093  . Class 1 obesity with body mass index (BMI) of 30.0 to 30.9 in adult 07/07/2019  . GOITER, UNSPECIFIED 04/28/2007  . DIZZINESS 04/28/2007  . TONSILLAR HYPERTROPHY, UNILATERAL 08/27/2006  . SARCOIDOSIS 04/03/2006  . ANEMIA NOS 04/03/2006  . TOBACCO ABUSE 04/03/2006  . HEADACHE, TENSION 04/03/2006  . DISORDER, DEPRESSIVE NEC 04/03/2006  . HIATAL HERNIA WITH REFLUX 04/03/2006  . EPICONDYLITIS, LATERAL 04/03/2006  . INSOMNIA 04/03/2006  . HELICOBACTER PYLORI GASTRITIS, HX OF 04/03/2006   Past Medical History:  Diagnosis Date  . Anemia   . Arthritis   . Complication of anesthesia   . Herpes genitalis in women 2010  .  Lactose intolerance   . MRSA infection greater than 3 months ago 2009  . PONV (postoperative nausea and vomiting)   . Sarcoid 1993   in remission per pt  . SOBOE (shortness of breath on exertion)     Family History  Problem Relation Age of Onset  . Other Mother   . Cancer Mother   . Hypertension Father   . Stroke Father   . Alcoholism Father   . Colon cancer Neg Hx   . Colon polyps Neg Hx   . Esophageal cancer Neg Hx   . Rectal cancer Neg Hx   . Stomach cancer Neg Hx     Past Surgical History:  Procedure Laterality Date  . CALCANEAL OSTEOTOMY  Left 07/30/2019   Procedure: CALCANEAL OSTEOTOMY;  Surgeon: Wylene Simmer, MD;  Location: [facility];  Service: Orthopedics;  Laterality: Left;  Marland Kitchen GASTROC RECESSION EXTREMITY Left 07/30/2019   Procedure: LEFT GASTROC RECESSION;  Surgeon: Wylene Simmer, MD;  Location: [facility];  Service: Orthopedics;  Laterality: Left;  . LYMPH NODE BIOPSY  1993  . POSTERIOR TIBIAL TENDON REPAIR Left 07/30/2019   Procedure: EXCISION  LEFT ACCESSSORY NAVICULAR, POSTERIOR TIBIAL TENDON RECONSTRUCTION;  Surgeon: Wylene Simmer, MD;  Location: [facility];  Service: Orthopedics;  Laterality: Left;  . TUBAL LIGATION  1988   Social History   Occupational History  . Occupation: Chartered certified accountant  Tobacco Use  . Smoking status: Some Days    Packs/day: 0.50    Types: Cigarettes    Start date: 07/02/1984  . Smokeless tobacco: Never  . Tobacco comments:    10 cig/day  Vaping Use  . Vaping Use: Every day  . Substances: Nicotine, Flavoring  Substance and Sexual Activity  . Alcohol use: Yes    Alcohol/week: 6.0 standard drinks of alcohol    Types: 6 Cans of beer per week    Comment: 1-2 beer   . Drug use: No  . Sexual activity: Yes    Birth control/protection: None, Surgical

## 2022-05-24 ENCOUNTER — Ambulatory Visit (INDEPENDENT_AMBULATORY_CARE_PROVIDER_SITE_OTHER): Payer: 59 | Admitting: Family Medicine

## 2022-05-24 ENCOUNTER — Encounter: Payer: Self-pay | Admitting: Family Medicine

## 2022-05-24 VITALS — BP 136/80 | HR 85 | Temp 98.6°F | Ht 62.0 in | Wt 167.2 lb

## 2022-05-24 DIAGNOSIS — R197 Diarrhea, unspecified: Secondary | ICD-10-CM

## 2022-05-24 NOTE — Patient Instructions (Signed)
A referral was placed for you see the gastroenterologist.  You should expect a phone call about scheduling this appointment.  Do not forget to try an over-the-counter probiotic.  You can also continue using Imodium as needed.

## 2022-05-24 NOTE — Progress Notes (Signed)
   Established Patient Office Visit   Subjective  Patient ID: Kathleen Vang, female    DOB: 03-31-1970  Age: 52 y.o. MRN: [id number]  Chief Complaint  Patient presents with   Diarrhea    Patient complains of diarrhea , x1 month, Tired Pepto bismol and Over the counter medications    Patient is a 52 year old female with followed by Dr. Martinique and seen for acute concern.  Patient endorses intermittent diarrhea x 1 month.  Notes history of lactose intolerance and diarrhea with solids but tries to avoid those foods.  May have anywhere from 3-7 or more stools per day.  Stools are loose but not watery and sometimes formed.  Patient also endorses flatus, increased bowel sounds.  Endorses 1 episode of loss of bowel.  Has tried Kaopectate, Pepto-Bismol, antidiarrheal medication without relief.  In the past was on a probiotic.  Colonoscopy 11/05/2018 was normal with repeat due in 10 years.  Patient still has her gallbladder.  Denies abdominal pain, nausea, vomiting, burning in stomach, foul-smelling stools.      ROS Negative unless stated above    Objective:     BP 136/80 (BP Location: Left Arm, Patient Position: Sitting, Cuff Size: Normal)   Pulse 85   Temp 98.6 F (37 C) (Oral)   Ht 5\' 2"  (1.575 m)   Wt 167 lb 3.2 oz (75.8 kg)   SpO2 98%   BMI 30.58 kg/m    Physical Exam Constitutional:      General: She is not in acute distress.    Appearance: Normal appearance.  HENT:     Head: Normocephalic and atraumatic.     Nose: Nose normal.     Mouth/Throat:     Mouth: Mucous membranes are moist.  Cardiovascular:     Rate and Rhythm: Normal rate and regular rhythm.     Heart sounds: Normal heart sounds. No murmur heard.    No gallop.  Pulmonary:     Effort: Pulmonary effort is normal. No respiratory distress.     Breath sounds: Normal breath sounds. No wheezing, rhonchi or rales.  Abdominal:     General: Bowel sounds are normal. There is no distension.     Palpations: Abdomen is  soft.     Tenderness: There is no abdominal tenderness.  Skin:    General: Skin is warm and dry.  Neurological:     Mental Status: She is alert and oriented to person, place, and time.      No results found for any visits on 05/24/22.    Assessment & Plan:  Diarrhea, unspecified type -New problem -Discussed possible causes including malabsorption due to IBS-D, SIBO, medications,etc. 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 Diff less likely. -Food diary -Probiotic -Consider low FODMAP diet -Continue OTC Imodium as directed -     Ambulatory referral to Gastroenterology    Return if symptoms worsen or fail to improve.   Billie Ruddy, MD

## 2022-06-05 ENCOUNTER — Encounter: Payer: Self-pay | Admitting: Family Medicine

## 2022-06-12 ENCOUNTER — Other Ambulatory Visit (HOSPITAL_COMMUNITY): Payer: Self-pay

## 2022-06-18 ENCOUNTER — Encounter: Payer: Self-pay | Admitting: Nurse Practitioner

## 2022-07-08 ENCOUNTER — Other Ambulatory Visit (HOSPITAL_COMMUNITY): Payer: Self-pay

## 2022-08-02 ENCOUNTER — Other Ambulatory Visit (HOSPITAL_COMMUNITY): Payer: Self-pay

## 2022-08-14 ENCOUNTER — Other Ambulatory Visit (INDEPENDENT_AMBULATORY_CARE_PROVIDER_SITE_OTHER): Payer: 59

## 2022-08-14 ENCOUNTER — Ambulatory Visit: Payer: 59 | Admitting: Nurse Practitioner

## 2022-08-14 ENCOUNTER — Encounter: Payer: Self-pay | Admitting: Nurse Practitioner

## 2022-08-14 VITALS — BP 120/78 | HR 72 | Ht 62.0 in | Wt 165.8 lb

## 2022-08-14 DIAGNOSIS — R197 Diarrhea, unspecified: Secondary | ICD-10-CM | POA: Diagnosis not present

## 2022-08-14 DIAGNOSIS — R194 Change in bowel habit: Secondary | ICD-10-CM

## 2022-08-14 LAB — TSH: TSH: 1.3 u[IU]/mL (ref 0.35–5.50)

## 2022-08-14 NOTE — Progress Notes (Signed)
Assessment / Plan   Primary GI:  Kathleen Aris, MD  52 year old female with bowel changes which started in Feb 2023.  Initially had diarrhea but after starting 3 different probiotics her stools are less frequent but still unformed.  -Stool for C-diff, lactoferrin, giardia antigen, fecal elastase.  -TSH, tTg, IgA. -If above negative and bowel movements not improving then will ask her to hold the 3 probiotics she is taking as they now could be contributing to the ongoing alteration in stool consistency. Then,  if symptoms persists off probiotics will consider treating empirically for SIBO vrs proceeding with colonoscopy with random colon biopsies to check for microscopic colitis   Sarcoid disease ( pulmonary). Hasn't required treatment in 20 years.    History of Present Illness   Chief Complaint: bowel changes.   Referred by PCP for diarrhea.   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  This was a new problem, typically she has a formed BM every morning. She has no idea what caused the diarrhea. No recent medication changes. No dietary changes. She had not had any antibiotics in the preceding few months.  Diarrhea was not associated with abdominal discomfort.  Diarrhea not necessarily triggered by eating though certain foods such as lettuce do make it worse . She is lactose intolerant and avoids diary for the most part. No significant nocturnal diarrhea ( only a couple of times).  Tried Kaopectate, Pepto-Bismol, and antidiarrheal medication without much relief.  PCP recommended she keep a food diary, start a probiotic and take imodium.  Diarrhea persisted, she was  referred to [place].   Interval History:  Shemika took Librarian, academic for a month without improvement. Then started acidophilus by Natures Bounty, Selenium, and Goli pre/post/pro probiotic. Bowel movements since improved but she not back to baseline. She went from about 6 loose BMs a day to about 2 /day. However stools are still not formed but rather mushy.  She has not had a formed stool in months.  Occasionally stool has been oily but she has not seen any blood.   Her weight is stable. She doesn't think she has C-diff since stool not malodorous. She doesn't use well water.  Does not hike / camp.    Previous Endoscopies / Labs /  Imaging   September 2020 screening colonoscopy - Mild diverticulosis in the sigmoid colon and in the descending colon. - Non-bleeding internal hemorrhoids. - The examination was otherwise normal. - No specimens collected.  Past Medical History:  Diagnosis Date   Anemia    Arthritis    Complication of anesthesia    Herpes genitalis in women 2010   Lactose intolerance    MRSA infection greater than 3 months ago 2009   PONV (postoperative nausea and vomiting)    Sarcoid 1993   in remission per pt   SOBOE (shortness of breath on exertion)    Past Surgical History:  Procedure Laterality Date   CALCANEAL OSTEOTOMY Left 07/30/2019   Procedure: CALCANEAL OSTEOTOMY;  Surgeon: Toni Arthurs, MD;  Location: [facility];  Service: Orthopedics;  Laterality: Left;   GASTROC RECESSION EXTREMITY Left 07/30/2019   Procedure: LEFT GASTROC RECESSION;  Surgeon: Victorino Dike,  Jonny Ruiz, MD;  Location: [facility];  Service: Orthopedics;  Laterality: Left;   LYMPH NODE BIOPSY  1993   POSTERIOR TIBIAL TENDON REPAIR Left 07/30/2019   Procedure: EXCISION  LEFT ACCESSSORY NAVICULAR, POSTERIOR TIBIAL TENDON RECONSTRUCTION;  Surgeon: Toni Arthurs, MD;  Location: [facility];  Service: Orthopedics;  Laterality: Left;   TUBAL LIGATION  1988   Family History  Problem  Relation Age of Onset   Other Mother    Cancer Mother    Hypertension Father    Stroke Father    Alcoholism Father    Colon cancer Neg Hx    Colon polyps Neg Hx    Esophageal cancer Neg Hx    Rectal cancer Neg Hx    Stomach cancer Neg Hx    Social History   Tobacco Use   Smoking status: Some Days    Packs/day: .5    Types: Cigarettes    Start date: 07/02/1984   Smokeless tobacco: Never   Tobacco comments:    10 cig/day  Vaping Use   Vaping Use: Every day   Substances: Nicotine, Flavoring  Substance Use Topics   Alcohol use: Yes    Alcohol/week: 6.0 standard drinks of alcohol    Types: 6 Cans of beer per week    Comment: 1-2 beer    Drug use: No   Current Outpatient Medications  Medication Sig Dispense Refill   estradiol (ESTRACE) 1 MG tablet Take 1 tablet (1 mg total) by mouth daily. 90 tablet 3   Multiple Vitamins-Minerals (MULTIVITAMIN WITH MINERALS) tablet Take by mouth.     nystatin (MYCOSTATIN/NYSTOP) powder Apply topically to affected area 2 (two) times daily. 60 g 3   Omega-3 Fatty Acids (FISH OIL PO) Take by mouth.     progesterone (PROMETRIUM) 100 MG capsule Take 1 capsule (100 mg total) by mouth daily. 90 capsule 3   sodium fluoride (PREVIDENT 5000 PLUS) 1.1 % CREA dental cream Brush on teeth for 1 minute then expectorate 51 g 1   triamcinolone cream (KENALOG) 0.1 % Apply a small amount to affected area once a day as needed 80 g 3   triamcinolone cream (KENALOG) 0.1 % Apply 1 Application topically 2 (two) times daily as needed to affected area. Do not use on the face 454 g 1   valACYclovir (VALTREX) 500 MG tablet TAKE 1 TABLET BY MOUTH ONCE DAILY 90 tablet 3   No current facility-administered medications for this visit.   Allergies  Allergen Reactions   Latex Hives and Itching   Amoxicillin Hives   Codeine Nausea And Vomiting   Penicillins Hives    REACTION: hives    Review of Systems: All systems reviewed and negative except where noted in HPI.   Wt  Readings from Last 3 Encounters:  05/24/22 167 lb 3.2 oz (75.8 kg)  01/15/22 168 lb (76.2 kg)  09/25/21 171 lb 8 oz (77.8 kg)    Physical Exam:  BP 120/78   Pulse 72   Ht 5\' 2"  (1.575 m)   Wt 165 lb 12.8 oz (75.2 kg)   LMP  (LMP Unknown)   BMI 30.33 kg/m  Constitutional:  Pleasant, generally well appearing female in no acute distress. Psychiatric:  Normal mood and affect. Behavior is normal. EENT: Pupils normal.  Conjunctivae are normal. No scleral icterus. Neck supple.  Cardiovascular: Normal rate, regular rhythm.  Pulmonary/chest: Effort normal and breath sounds normal. No wheezing, rales or rhonchi. Abdominal: Soft, nondistended, nontender. Bowel sounds active  throughout. There are no masses palpable. No hepatomegaly. Neurological: Alert and oriented to person place and time. Skin: Skin is warm and dry. No rashes noted.  Willette Cluster, NP  08/14/2022, 2:06 PM  Cc:  Referring Provider Abbe Amsterdam, MD

## 2022-08-14 NOTE — Patient Instructions (Addendum)
Your provider has requested that you go to the basement level for lab work before leaving today. Press "B" on the elevator. The lab is located at the first door on the left as you exit the elevator.   We will call you with the results  _______________________________________________________  If your blood pressure at your visit was 140/90 or greater, please contact your primary care physician to follow up on this.  _______________________________________________________  If you are age 108 or older, your body mass index should be between 23-30. Your Body mass index is 30.33 kg/m. If this is out of the aforementioned range listed, please consider follow up with your Primary Care Provider.  If you are age 54 or younger, your body mass index should be between 19-25. Your Body mass index is 30.33 kg/m. If this is out of the aformentioned range listed, please consider follow up with your Primary Care Provider.   ________________________________________________________  The [facility] providers would like to encourage you to use [facility] to communicate with providers for non-urgent requests or questions.  Due to long hold times on the telephone, sending your provider a message by [facility] may be a faster and more efficient way to get a response.  Please allow 48 business hours for a response.  Please remember that this is for non-urgent requests.  _______________________________________________________   Thank you for entrusting me with your care and choosing [facility].  Willette Cluster NP

## 2022-08-15 LAB — IGA: Immunoglobulin A: 363 mg/dL — ABNORMAL HIGH (ref 47–310)

## 2022-08-15 LAB — TISSUE TRANSGLUTAMINASE, IGA: (tTG) Ab, IgA: <1 U/mL

## 2022-08-16 ENCOUNTER — Ambulatory Visit: Payer: 59

## 2022-08-16 DIAGNOSIS — R197 Diarrhea, unspecified: Secondary | ICD-10-CM | POA: Diagnosis not present

## 2022-08-18 LAB — GIARDIA ANTIGEN
MICRO NUMBER:: 15079118
RESULT:: NOT DETECTED
SPECIMEN QUALITY:: ADEQUATE

## 2022-08-24 LAB — PANCREATIC ELASTASE, FECAL: Pancreatic Elastase-1, Stool: 154 mcg/g — ABNORMAL LOW

## 2022-08-27 ENCOUNTER — Other Ambulatory Visit (HOSPITAL_COMMUNITY): Payer: Self-pay

## 2022-08-27 ENCOUNTER — Other Ambulatory Visit: Payer: Self-pay

## 2022-08-27 ENCOUNTER — Telehealth: Payer: Self-pay | Admitting: Nurse Practitioner

## 2022-08-27 MED ORDER — PANCRELIPASE (LIP-PROT-AMYL) 36000-114000 UNITS PO CPEP
ORAL_CAPSULE | ORAL | 2 refills | Status: DC
  Filled 2022-08-27: qty 240, 30d supply, fill #0
  Filled 2022-10-15: qty 240, 30d supply, fill #1
  Filled 2022-11-29: qty 240, 30d supply, fill #2

## 2022-08-27 NOTE — Telephone Encounter (Signed)
Inbound call from patient in regards to lab results. Please advise.  Thank you

## 2022-08-28 ENCOUNTER — Other Ambulatory Visit (HOSPITAL_COMMUNITY): Payer: Self-pay

## 2022-08-28 NOTE — Telephone Encounter (Signed)
Discussed lab results with the patient. See the fecal elastase results. She agrees to try Creon. See medication list. Samples #24 offered to the patient.

## 2022-08-28 NOTE — Telephone Encounter (Signed)
Inbound call from patient requesting to speak with nurse. Please advise.

## 2022-08-31 ENCOUNTER — Other Ambulatory Visit (HOSPITAL_COMMUNITY): Payer: Self-pay

## 2022-09-24 ENCOUNTER — Other Ambulatory Visit (HOSPITAL_COMMUNITY): Payer: Self-pay

## 2022-09-24 MED ORDER — VALACYCLOVIR HCL 500 MG PO TABS
500.0000 mg | ORAL_TABLET | Freq: Every day | ORAL | 3 refills | Status: DC
  Filled 2022-09-24: qty 90, 90d supply, fill #0
  Filled 2022-12-19: qty 90, 90d supply, fill #1
  Filled 2023-03-21: qty 90, 90d supply, fill #2
  Filled 2023-06-17: qty 90, 90d supply, fill #3

## 2022-09-25 ENCOUNTER — Other Ambulatory Visit (HOSPITAL_COMMUNITY): Payer: Self-pay

## 2022-09-28 ENCOUNTER — Encounter: Payer: 59 | Admitting: Family Medicine

## 2022-10-02 NOTE — Progress Notes (Unsigned)
HPI: [name] is a 51 y.o. female, who is here today for her routine physical.  Last CPE: 09/25/21  Regular exercise 3 or more time per week: *** Following a healthy diet: ***  Chronic medical problems: Back pain,tobacco use disorder, headaches,and eczema (follows with dermatologist) among some.   Immunization History  Administered Date(s) Administered   Influenza,inj,Quad PF,6+ Mos 11/10/2021   Influenza-Unspecified 12/30/2020   Moderna Sars-Covid-2 Vaccination 09/26/2019, 10/17/2019   PFIZER(Purple Top)SARS-COV-2 Vaccination 10/17/2019   Pneumococcal Polysaccharide-23 09/25/2021   Td 03/05/2006   Tdap 07/27/2020   Zoster Recombinant(Shingrix) 12/30/2020, 03/13/2021   Health Maintenance  Topic Date Due   COVID-19 Vaccine (4 - 2023-24 season) 11/03/2021   MAMMOGRAM  12/13/2021   INFLUENZA VACCINE  10/04/2022   PAP SMEAR-Modifier  10/24/2025   Colonoscopy  11/04/2028   DTaP/Tdap/Td (3 - Td or Tdap) 07/28/2030   Hepatitis C Screening  Completed   HIV Screening  Completed   Zoster Vaccines- Shingrix  Completed   HPV VACCINES  Aged Out   Goiter:TSH in 08/2022 was 1.30. She has not noted abnormal wt loss,palpitation,diarrhea,or tremor.  Review of Systems  Current Outpatient Medications on File Prior to Visit  Medication Sig Dispense Refill   lipase/protease/amylase (CREON) 36000 UNITS CPEP capsule Take 2 capsules (72,000 Units total) by mouth 3 (three) times daily with meals. May also take 1 capsule (36,000 Units total) as needed (with snacks). 240 capsule 2   estradiol (ESTRACE) 1 MG tablet Take 1 tablet (1 mg total) by mouth daily. 90 tablet 3   Multiple Vitamins-Minerals (MULTIVITAMIN WITH MINERALS) tablet Take by mouth.     nystatin (MYCOSTATIN/NYSTOP) powder Apply topically to affected area 2 (two) times daily. 60 g 3   Omega-3 Fatty Acids (FISH OIL PO) Take by mouth.     progesterone (PROMETRIUM) 100 MG capsule Take 1 capsule (100 mg total) by mouth daily. 90  capsule 3   sodium fluoride (PREVIDENT 5000 PLUS) 1.1 % CREA dental cream Brush on teeth for 1 minute then expectorate 51 g 1   triamcinolone cream (KENALOG) 0.1 % Apply a small amount to affected area once a day as needed 80 g 3   valACYclovir (VALTREX) 500 MG tablet Take 1 tablet (500 mg total) by mouth daily. 90 tablet 3   [DISCONTINUED] PARoxetine (PAXIL) 10 MG tablet Take 1 tablet (10 mg total) by mouth daily. 30 tablet 1   [DISCONTINUED] venlafaxine XR (EFFEXOR XR) 37.5 MG 24 hr capsule Take 1 capsule (37.5 mg total) by mouth daily with breakfast. 30 capsule 1   No current facility-administered medications on file prior to visit.    Past Medical History:  Diagnosis Date   Anemia    Arthritis    Complication of anesthesia    Herpes genitalis in women 2010   Lactose intolerance    MRSA infection greater than 3 months ago 2009   PONV (postoperative nausea and vomiting)    Sarcoid 1993   in remission per pt   SOBOE (shortness of breath on exertion)     Past Surgical History:  Procedure Laterality Date   CALCANEAL OSTEOTOMY Left 07/30/2019   Procedure: CALCANEAL OSTEOTOMY;  Surgeon: Toni Arthurs, MD;  Location: [facility];  Service: Orthopedics;  Laterality: Left;   GASTROC RECESSION EXTREMITY Left 07/30/2019   Procedure: LEFT GASTROC RECESSION;  Surgeon: Toni Arthurs, MD;  Location: [facility];  Service: Orthopedics;  Laterality: Left;   LYMPH NODE BIOPSY  1993   POSTERIOR TIBIAL  TENDON REPAIR Left 07/30/2019   Procedure: EXCISION  LEFT ACCESSSORY NAVICULAR, POSTERIOR TIBIAL TENDON RECONSTRUCTION;  Surgeon: Toni Arthurs, MD;  Location: [facility];  Service: Orthopedics;  Laterality: Left;   TUBAL LIGATION  1988    Allergies  Allergen Reactions   Latex Hives and Itching   Amoxicillin Hives   Codeine Nausea And Vomiting   Penicillins Hives    REACTION: hives    Family History  Problem Relation Age of Onset   Other Mother     Cancer Mother    Hypertension Father    Stroke Father    Alcoholism Father    Colon cancer Neg Hx    Colon polyps Neg Hx    Esophageal cancer Neg Hx    Rectal cancer Neg Hx    Stomach cancer Neg Hx     Social History   Socioeconomic History   Marital status: Single    Spouse name: Not on file   Number of children: 2   Years of education: Not on file   Highest education level: GED or equivalent  Occupational History   Occupation: Psychologist, sport and exercise  Tobacco Use   Smoking status: Some Days    Current packs/day: 0.50    Average packs/day: 0.5 packs/day for 38.2 years (19.1 ttl pk-yrs)    Types: Cigarettes    Start date: 07/02/1984   Smokeless tobacco: Never   Tobacco comments:    10 cig/day  Vaping Use   Vaping status: Every Day   Substances: Nicotine, Flavoring  Substance and Sexual Activity   Alcohol use: Yes    Alcohol/week: 6.0 standard drinks of alcohol    Types: 6 Cans of beer per week    Comment: 1-2 beer    Drug use: No   Sexual activity: Yes    Birth control/protection: None, Surgical  Other Topics Concern   Not on file  Social History Narrative   Not on file   Social Determinants of Health   Financial Resource Strain: Not on file  Food Insecurity: Not on file  Transportation Needs: Not on file  Physical Activity: Not on file  Stress: Not on file  Social Connections: Not on file    There were no vitals filed for this visit. There is no height or weight on file to calculate BMI.  Wt Readings from Last 3 Encounters:  08/14/22 165 lb 12.8 oz (75.2 kg)  05/24/22 167 lb 3.2 oz (75.8 kg)  01/15/22 168 lb (76.2 kg)    Physical Exam  ASSESSMENT AND PLAN: [name] was here today annual physical examination.  No orders of the defined types were placed in this encounter.   There are no diagnoses linked to this encounter.  There are no diagnoses linked to this encounter.  No follow-ups on file.  Kathleen Basquez G. [place], MD  [facility]. [place] office.

## 2022-10-03 ENCOUNTER — Ambulatory Visit (INDEPENDENT_AMBULATORY_CARE_PROVIDER_SITE_OTHER): Payer: 59 | Admitting: Family Medicine

## 2022-10-03 ENCOUNTER — Encounter: Payer: Self-pay | Admitting: Family Medicine

## 2022-10-03 ENCOUNTER — Other Ambulatory Visit (HOSPITAL_COMMUNITY): Payer: Self-pay

## 2022-10-03 VITALS — BP 128/80 | HR 82 | Temp 98.9°F | Resp 12 | Ht 62.0 in | Wt 165.1 lb

## 2022-10-03 DIAGNOSIS — E785 Hyperlipidemia, unspecified: Secondary | ICD-10-CM | POA: Diagnosis not present

## 2022-10-03 DIAGNOSIS — Z13220 Encounter for screening for lipoid disorders: Secondary | ICD-10-CM

## 2022-10-03 DIAGNOSIS — Z130 Encounter for screening for diseases of the blood and blood-forming organs and certain disorders involving the immune mechanism: Secondary | ICD-10-CM | POA: Diagnosis not present

## 2022-10-03 DIAGNOSIS — G4700 Insomnia, unspecified: Secondary | ICD-10-CM | POA: Diagnosis not present

## 2022-10-03 DIAGNOSIS — Z0000 Encounter for general adult medical examination without abnormal findings: Secondary | ICD-10-CM | POA: Insufficient documentation

## 2022-10-03 DIAGNOSIS — Z1329 Encounter for screening for other suspected endocrine disorder: Secondary | ICD-10-CM

## 2022-10-03 DIAGNOSIS — Z13228 Encounter for screening for other metabolic disorders: Secondary | ICD-10-CM | POA: Diagnosis not present

## 2022-10-03 LAB — COMPREHENSIVE METABOLIC PANEL
ALT: 16 U/L (ref 0–35)
AST: 24 U/L (ref 0–37)
Albumin: 4.1 g/dL (ref 3.5–5.2)
Alkaline Phosphatase: 44 U/L (ref 39–117)
BUN: 6 mg/dL (ref 6–23)
CO2: 28 mEq/L (ref 19–32)
Calcium: 9.2 mg/dL (ref 8.4–10.5)
Chloride: 108 mEq/L (ref 96–112)
Creatinine, Ser: 0.82 mg/dL (ref 0.40–1.20)
GFR: 81.19 mL/min (ref >60.00)
Glucose, Bld: 95 mg/dL (ref 70–99)
Potassium: 3.8 mEq/L (ref 3.5–5.1)
Sodium: 142 mEq/L (ref 135–145)
Total Bilirubin: 0.4 mg/dL (ref 0.2–1.2)
Total Protein: 7.2 g/dL (ref 6.0–8.3)

## 2022-10-03 LAB — LIPID PANEL
Cholesterol: 161 mg/dL (ref 0–200)
HDL: 38.8 mg/dL — ABNORMAL LOW (ref >39.00)
NonHDL: 121.79
Total CHOL/HDL Ratio: 4
Triglycerides: 242 mg/dL — ABNORMAL HIGH (ref 0.0–149.0)
VLDL: 48.4 mg/dL — ABNORMAL HIGH (ref 0.0–40.0)

## 2022-10-03 LAB — LDL CHOLESTEROL, DIRECT: Direct LDL: 79 mg/dL

## 2022-10-03 MED ORDER — DOXEPIN HCL 10 MG/ML PO CONC
3.0000 mg | Freq: Every day | ORAL | 1 refills | Status: DC
Start: 2022-10-03 — End: 2023-02-05
  Filled 2022-10-03: qty 30, 30d supply, fill #0

## 2022-10-03 NOTE — Assessment & Plan Note (Signed)
Chronic. We discussed pharmacologic treatment options, she agrees with trying doxepin, starting with 30 mg 30 to 45 minutes before bedtime and titrate up to 10 mg as needed.  Instructed to let me know if medication is helping in about 3 to 4 weeks. We discussed some side effects, it may be help with diarrhea. History of the importance of good sleep hygiene. Follow-up in 4 months, before if needed

## 2022-10-03 NOTE — Assessment & Plan Note (Signed)
We discussed the importance of regular physical activity and healthy diet for prevention of chronic illness and/or complications. Preventive guidelines reviewed. Vaccination up-to-date. Continue her female preventive care with her gynecologist. Next CPE in a year.

## 2022-10-03 NOTE — Patient Instructions (Addendum)
A few things to remember from today's visit:  Routine general medical examination at a health care facility  Hyperlipidemia, unspecified hyperlipidemia type - Plan: Lipid panel, Comprehensive metabolic panel  Screening for endocrine, metabolic, and immunity disorder - Plan: Comprehensive metabolic panel  Insomnia, unspecified type - Plan: doxepin (SINEQUAN) 10 MG/ML solution  Doxepin 30-45 min before bedtime, start with 0.3 ml and titrate up to 1 ml if needed.  If you need refills for medications you take chronically, please call your pharmacy. Do not use My Chart to request refills or for acute issues that need immediate attention. If you send a my chart message, it may take a few days to be addressed, specially if I am not in the office.  Please be sure medication list is accurate. If a new problem present, please set up appointment sooner than planned today.  Health Maintenance, Female Adopting a healthy lifestyle and getting preventive care are important in promoting health and wellness. Ask your health care provider about: The right schedule for you to have regular tests and exams. Things you can do on your own to prevent diseases and keep yourself healthy. What should I know about diet, weight, and exercise? Eat a healthy diet  Eat a diet that includes plenty of vegetables, fruits, low-fat dairy products, and lean protein. Do not eat a lot of foods that are high in solid fats, added sugars, or sodium. Maintain a healthy weight Body mass index (BMI) is used to identify weight problems. It estimates body fat based on height and weight. Your health care provider can help determine your BMI and help you achieve or maintain a healthy weight. Get regular exercise Get regular exercise. This is one of the most important things you can do for your health. Most adults should: Exercise for at least 150 minutes each week. The exercise should increase your heart rate and make you sweat  (moderate-intensity exercise). Do strengthening exercises at least twice a week. This is in addition to the moderate-intensity exercise. Spend less time sitting. Even light physical activity can be beneficial. Watch cholesterol and blood lipids Have your blood tested for lipids and cholesterol at 54 years of age, then have this test every 5 years. Have your cholesterol levels checked more often if: Your lipid or cholesterol levels are high. You are older than 54 years of age. You are at high risk for heart disease. What should I know about cancer screening? Depending on your health history and family history, you may need to have cancer screening at various ages. This may include screening for: Breast cancer. Cervical cancer. Colorectal cancer. Skin cancer. Lung cancer. What should I know about heart disease, diabetes, and high blood pressure? Blood pressure and heart disease High blood pressure causes heart disease and increases the risk of stroke. This is more likely to develop in people who have high blood pressure readings or are overweight. Have your blood pressure checked: Every 3-5 years if you are 6-77 years of age. Every year if you are 75 years old or older. Diabetes Have regular diabetes screenings. This checks your fasting blood sugar level. Have the screening done: Once every three years after age 37 if you are at a normal weight and have a low risk for diabetes. More often and at a younger age if you are overweight or have a high risk for diabetes. What should I know about preventing infection? Hepatitis B If you have a higher risk for hepatitis B, you should be screened  for this virus. Talk with your health care provider to find out if you are at risk for hepatitis B infection. Hepatitis C Testing is recommended for: Everyone born from [birth year] through 1967. Anyone with known risk factors for hepatitis C. Sexually transmitted infections (STIs) Get screened for STIs,  including gonorrhea and chlamydia, if: You are sexually active and are younger than 54 years of age. You are older than 54 years of age and your health care provider tells you that you are at risk for this type of infection. Your sexual activity has changed since you were last screened, and you are at increased risk for chlamydia or gonorrhea. Ask your health care provider if you are at risk. Ask your health care provider about whether you are at high risk for HIV. Your health care provider may recommend a prescription medicine to help prevent HIV infection. If you choose to take medicine to prevent HIV, you should first get tested for HIV. You should then be tested every 3 months for as long as you are taking the medicine. Pregnancy If you are about to stop having your period (premenopausal) and you may become pregnant, seek counseling before you get pregnant. Take 400 to 800 micrograms (mcg) of folic acid every day if you become pregnant. Ask for birth control (contraception) if you want to prevent pregnancy. Osteoporosis and menopause Osteoporosis is a disease in which the bones lose minerals and strength with aging. This can result in bone fractures. If you are 19 years old or older, or if you are at risk for osteoporosis and fractures, ask your health care provider if you should: Be screened for bone loss. Take a calcium or vitamin D supplement to lower your risk of fractures. Be given hormone replacement therapy (HRT) to treat symptoms of menopause. Follow these instructions at home: Alcohol use Do not drink alcohol if: Your health care provider tells you not to drink. You are pregnant, may be pregnant, or are planning to become pregnant. If you drink alcohol: Limit how much you have to: 0-1 drink a day. Know how much alcohol is in your drink. In the [place] one drink equals one 12 oz bottle of beer (355 mL), one 5 oz glass of wine (148 mL), or one 1 oz glass of hard liquor (44  mL). Lifestyle Do not use any products that contain nicotine or tobacco. These products include cigarettes, chewing tobacco, and vaping devices, such as e-cigarettes. If you need help quitting, ask your health care provider. Do not use street drugs. Do not share needles. Ask your health care provider for help if you need support or information about quitting drugs. General instructions Schedule regular health, dental, and eye exams. Stay current with your vaccines. Tell your health care provider if: You often feel depressed. You have ever been abused or do not feel safe at home. Summary Adopting a healthy lifestyle and getting preventive care are important in promoting health and wellness. Follow your health care provider's instructions about healthy diet, exercising, and getting tested or screened for diseases. Follow your health care provider's instructions on monitoring your cholesterol and blood pressure. This information is not intended to replace advice given to you by your health care provider. Make sure you discuss any questions you have with your health care provider. Document Revised: 07/11/2020 Document Reviewed: 07/11/2020 Elsevier Patient Education  2024 [employer].

## 2022-10-03 NOTE — Assessment & Plan Note (Signed)
Non pharmacologic treatment recommended for now. Further recommendations will be given according to 10 years CVD risk score and lipid panel numbers. 

## 2022-10-15 ENCOUNTER — Other Ambulatory Visit (HOSPITAL_COMMUNITY): Payer: Self-pay

## 2022-10-29 ENCOUNTER — Other Ambulatory Visit (HOSPITAL_COMMUNITY): Payer: Self-pay

## 2022-11-12 ENCOUNTER — Telehealth: Payer: 59 | Admitting: Physician Assistant

## 2022-11-12 DIAGNOSIS — R051 Acute cough: Secondary | ICD-10-CM | POA: Diagnosis not present

## 2022-11-12 DIAGNOSIS — B9689 Other specified bacterial agents as the cause of diseases classified elsewhere: Secondary | ICD-10-CM | POA: Diagnosis not present

## 2022-11-12 DIAGNOSIS — J019 Acute sinusitis, unspecified: Secondary | ICD-10-CM | POA: Diagnosis not present

## 2022-11-12 MED ORDER — PREDNISONE 20 MG PO TABS
40.0000 mg | ORAL_TABLET | Freq: Every day | ORAL | 0 refills | Status: DC
Start: 2022-11-12 — End: 2023-03-12
  Filled 2022-11-12: qty 10, 5d supply, fill #0

## 2022-11-12 MED ORDER — DOXYCYCLINE HYCLATE 100 MG PO TABS
100.0000 mg | ORAL_TABLET | Freq: Two times a day (BID) | ORAL | 0 refills | Status: DC
Start: 2022-11-12 — End: 2023-03-12
  Filled 2022-11-12: qty 20, 10d supply, fill #0

## 2022-11-12 MED ORDER — PROMETHAZINE-DM 6.25-15 MG/5ML PO SYRP
5.0000 mL | ORAL_SOLUTION | Freq: Four times a day (QID) | ORAL | 0 refills | Status: DC | PRN
Start: 2022-11-12 — End: 2023-03-12
  Filled 2022-11-12: qty 118, 6d supply, fill #0

## 2022-11-12 NOTE — Patient Instructions (Signed)
Lelon Mast, thank you for joining Margaretann Loveless, PA-C for today's virtual visit.  While this provider is not your primary care provider (PCP), if your PCP is located in our provider database this encounter information will be shared with them immediately following your visit.   A [facility] MyChart account gives you access to today's visit and all your visits, tests, and labs performed at [facility] " click here if you don't have a  MyChart account or go to [email or web address]  Consent: (Patient) Kathleen Vang provided verbal consent for this virtual visit at the beginning of the encounter.  Current Medications:  Current Outpatient Medications:    doxycycline (VIBRA-TABS) 100 MG tablet, Take 1 tablet (100 mg total) by mouth 2 (two) times daily., Disp: 20 tablet, Rfl: 0   predniSONE (DELTASONE) 20 MG tablet, Take 2 tablets (40 mg total) by mouth daily with breakfast., Disp: 10 tablet, Rfl: 0   promethazine-dextromethorphan (PROMETHAZINE-DM) 6.25-15 MG/5ML syrup, Take 5 mLs by mouth 4 (four) times daily as needed., Disp: 118 mL, Rfl: 0   doxepin (SINEQUAN) 10 MG/ML solution, Take 0.3-1 mL (3-10 mg total) by mouth at bedtime., Disp: 30 mL, Rfl: 1   estradiol (ESTRACE) 1 MG tablet, Take 1 tablet (1 mg total) by mouth daily., Disp: 90 tablet, Rfl: 3   lipase/protease/amylase (CREON) 36000 UNITS CPEP capsule, Take 2 capsules (72,000 Units total) by mouth 3 (three) times daily with meals. May also take 1 capsule (36,000 Units total) as needed (with snacks)., Disp: 240 capsule, Rfl: 2   Multiple Vitamins-Minerals (MULTIVITAMIN WITH MINERALS) tablet, Take by mouth., Disp: , Rfl:    nystatin (MYCOSTATIN/NYSTOP) powder, Apply topically to affected area 2 (two) times daily., Disp: 60 g, Rfl: 3   Omega-3 Fatty Acids (FISH OIL PO), Take by mouth., Disp: , Rfl:    progesterone (PROMETRIUM) 100 MG capsule, Take 1 capsule (100 mg total) by mouth daily., Disp: 90 capsule,  Rfl: 3   sodium fluoride (PREVIDENT 5000 PLUS) 1.1 % CREA dental cream, Brush on teeth for 1 minute then expectorate, Disp: 51 g, Rfl: 1   triamcinolone cream (KENALOG) 0.1 %, Apply a small amount to affected area once a day as needed, Disp: 80 g, Rfl: 3   valACYclovir (VALTREX) 500 MG tablet, Take 1 tablet (500 mg total) by mouth daily., Disp: 90 tablet, Rfl: 3   Medications ordered in this encounter:  Meds ordered this encounter  Medications   doxycycline (VIBRA-TABS) 100 MG tablet    Sig: Take 1 tablet (100 mg total) by mouth 2 (two) times daily.    Dispense:  20 tablet    Refill:  0    Order Specific Question:   Supervising Provider    Answer:   Merrilee Jansky [[id number]]   predniSONE (DELTASONE) 20 MG tablet    Sig: Take 2 tablets (40 mg total) by mouth daily with breakfast.    Dispense:  10 tablet    Refill:  0    Order Specific Question:   Supervising Provider    Answer:   Merrilee Jansky [[id number]]   promethazine-dextromethorphan (PROMETHAZINE-DM) 6.25-15 MG/5ML syrup    Sig: Take 5 mLs by mouth 4 (four) times daily as needed.    Dispense:  118 mL    Refill:  0    Order Specific Question:   Supervising Provider    Answer:   Merrilee Jansky [id number]     *If you need refills on other medications prior to your next  appointment, please contact your pharmacy*  Follow-Up: Call back or seek an in-person evaluation if the symptoms worsen or if the condition fails to improve as anticipated.  [facility] [phone]  Other Instructions Sinus Infection, Adult A sinus infection, also called sinusitis, is inflammation of your sinuses. Sinuses are hollow spaces in the bones around your face. Your sinuses are located: Around your eyes. In the middle of your forehead. Behind your nose. In your cheekbones. Mucus normally drains out of your sinuses. When your nasal tissues become inflamed or swollen, mucus can become trapped or blocked. This allows bacteria,  viruses, and fungi to grow, which leads to infection. Most infections of the sinuses are caused by a virus. A sinus infection can develop quickly. It can last for up to 4 weeks (acute) or for more than 12 weeks (chronic). A sinus infection often develops after a cold. What are the causes? This condition is caused by anything that creates swelling in the sinuses or stops mucus from draining. This includes: Allergies. Asthma. Infection from bacteria or viruses. Deformities or blockages in your nose or sinuses. Abnormal growths in the nose (nasal polyps). Pollutants, such as chemicals or irritants in the air. Infection from fungi. This is rare. What increases the risk? You are more likely to develop this condition if you: Have a weak body defense system (immune system). Do a lot of swimming or diving. Overuse nasal sprays. Smoke. What are the signs or symptoms? The main symptoms of this condition are pain and a feeling of pressure around the affected sinuses. Other symptoms include: Stuffy nose or congestion that makes it difficult to breathe through your nose. Thick yellow or greenish drainage from your nose. Tenderness, swelling, and warmth over the affected sinuses. A cough that may get worse at night. Decreased sense of smell and taste. Extra mucus that collects in the throat or the back of the nose (postnasal drip) causing a sore throat or bad breath. Tiredness (fatigue). Fever. How is this diagnosed? This condition is diagnosed based on: Your symptoms. Your medical history. A physical exam. Tests to find out if your condition is acute or chronic. This may include: Checking your nose for nasal polyps. Viewing your sinuses using a device that has a light (endoscope). Testing for allergies or bacteria. Imaging tests, such as an MRI or CT scan. In rare cases, a bone biopsy may be done to rule out more serious types of fungal sinus disease. How is this treated? Treatment for a  sinus infection depends on the cause and whether your condition is chronic or acute. If caused by a virus, your symptoms should go away on their own within 10 days. You may be given medicines to relieve symptoms. They include: Medicines that shrink swollen nasal passages (decongestants). A spray that eases inflammation of the nostrils (topical intranasal corticosteroids). Rinses that help get rid of thick mucus in your nose (nasal saline washes). Medicines that treat allergies (antihistamines). Over-the-counter pain relievers. If caused by bacteria, your health care provider may recommend waiting to see if your symptoms improve. Most bacterial infections will get better without antibiotic medicine. You may be given antibiotics if you have: A severe infection. A weak immune system. If caused by narrow nasal passages or nasal polyps, surgery may be needed. Follow these instructions at home: Medicines Take, use, or apply over-the-counter and prescription medicines only as told by your health care provider. These may include nasal sprays. If you were prescribed an antibiotic medicine, take  it as told by your health care provider. Do not stop taking the antibiotic even if you start to feel better. Hydrate and humidify  Drink enough fluid to keep your urine pale yellow. Staying hydrated will help to thin your mucus. Use a cool mist humidifier to keep the humidity level in your home above 50%. Inhale steam for 10-15 minutes, 3-4 times a day, or as told by your health care provider. You can do this in the bathroom while a hot shower is running. Limit your exposure to cool or dry air. Rest Rest as much as possible. Sleep with your head raised (elevated). Make sure you get enough sleep each night. General instructions  Apply a warm, moist washcloth to your face 3-4 times a day or as told by your health care provider. This will help with discomfort. Use nasal saline washes as often as told by your  health care provider. Wash your hands often with soap and water to reduce your exposure to germs. If soap and water are not available, use hand sanitizer. Do not smoke. Avoid being around people who are smoking (secondhand smoke). Keep all follow-up visits. This is important. Contact a health care provider if: You have a fever. Your symptoms get worse. Your symptoms do not improve within 10 days. Get help right away if: You have a severe headache. You have persistent vomiting. You have severe pain or swelling around your face or eyes. You have vision problems. You develop confusion. Your neck is stiff. You have trouble breathing. These symptoms may be an emergency. Get help right away. Call 911. Do not wait to see if the symptoms will go away. Do not drive yourself to the hospital. Summary A sinus infection is soreness and inflammation of your sinuses. Sinuses are hollow spaces in the bones around your face. This condition is caused by nasal tissues that become inflamed or swollen. The swelling traps or blocks the flow of mucus. This allows bacteria, viruses, and fungi to grow, which leads to infection. If you were prescribed an antibiotic medicine, take it as told by your health care provider. Do not stop taking the antibiotic even if you start to feel better. Keep all follow-up visits. This is important. This information is not intended to replace advice given to you by your health care provider. Make sure you discuss any questions you have with your health care provider. Document Revised: 01/24/2021 Document Reviewed: 01/24/2021 Elsevier Patient Education  2024 [employer]    If you have been instructed to have an in-person evaluation today at a local Urgent Care facility, please use the link below. It will take you to a list of all of our available [facility], including address, phone number and hours of operation. Please do not delay care.  [facility]  If you or a family member do not have a primary care provider, use the link below to schedule a visit and establish care. When you choose a [facility] primary care physician or advanced practice provider, you gain a long-term partner in health. Find a Primary Care Provider  Learn more about [facility]'s in-office and virtual care options: [facility] - Get Care Now

## 2022-11-12 NOTE — Progress Notes (Signed)
Virtual Visit Consent   Kathleen Vang, you are scheduled for a virtual visit with a [facility] provider today. Just as with appointments in the office, your consent must be obtained to participate. Your consent will be active for this visit and any virtual visit you may have with one of our providers in the next 365 days. If you have a MyChart account, a copy of this consent can be sent to you electronically.  As this is a virtual visit, video technology does not allow for your provider to perform a traditional examination. This may limit your provider's ability to fully assess your condition. If your provider identifies any concerns that need to be evaluated in person or the need to arrange testing (such as labs, EKG, etc.), we will make arrangements to do so. Although advances in technology are sophisticated, we cannot ensure that it will always work on either your end or our end. If the connection with a video visit is poor, the visit may have to be switched to a telephone visit. With either a video or telephone visit, we are not always able to ensure that we have a secure connection.  By engaging in this virtual visit, you consent to the provision of healthcare and authorize for your insurance to be billed (if applicable) for the services provided during this visit. Depending on your insurance coverage, you may receive a charge related to this service.  I need to obtain your verbal consent now. Are you willing to proceed with your visit today? Kathleen Vang has provided verbal consent on 11/12/2022 for a virtual visit (video or telephone). Margaretann Loveless, PA-C  Date: 11/12/2022 5:31 PM  Virtual Visit via Video Note   I, Margaretann Loveless, connected with  Kathleen Vang  ([id number], Jan 20, 1971) on 11/12/22 at  5:30 PM EDT by a video-enabled telemedicine application and verified that I am speaking with the correct person using two identifiers.  Location: Patient: Virtual Visit Location  Patient: Home Provider: Virtual Visit Location Provider: Home Office   I discussed the limitations of evaluation and management by telemedicine and the availability of in person appointments. The patient expressed understanding and agreed to proceed.    History of Present Illness: Kathleen Vang is a 52 y.o. who identifies as a female who was assigned female at birth, and is being seen today for possible sinus infection.  HPI: Sinusitis This is a new problem. The current episode started in the past 7 days. The problem has been gradually worsening since onset. There has been no fever. The pain is moderate. Associated symptoms include congestion, coughing (harsh, barking), headaches, a hoarse voice and sinus pressure. Pertinent negatives include no chills, ear pain, sneezing, sore throat or swollen glands. (Post nasal drainage) Treatments tried: allegra-D. The treatment provided no relief.  Negative at home Covid 19 testing.   Problems:  Patient Active Problem List   Diagnosis Date Noted   Hyperlipidemia 10/03/2022   Routine general medical examination at a health care facility 10/03/2022   Chronic pruritic rash in adult 09/30/2020   Lactose intolerance 06/01/2020   Other fatigue 06/01/2020   SOBOE (shortness of breath on exertion) 06/01/2020   Elevated blood pressure reading 06/01/2020   At risk for impaired metabolic function 06/01/2020   Class 1 obesity with body mass index (BMI) of 30.0 to 30.9 in adult 07/07/2019   GOITER, UNSPECIFIED 04/28/2007   DIZZINESS 04/28/2007   TONSILLAR HYPERTROPHY, UNILATERAL 08/27/2006   SARCOIDOSIS 04/03/2006   ANEMIA NOS 04/03/2006  TOBACCO ABUSE 04/03/2006   HEADACHE, TENSION 04/03/2006   DISORDER, DEPRESSIVE NEC 04/03/2006   HIATAL HERNIA WITH REFLUX 04/03/2006   EPICONDYLITIS, LATERAL 04/03/2006   INSOMNIA 04/03/2006   HELICOBACTER PYLORI GASTRITIS, HX OF 04/03/2006    Allergies:  Allergies  Allergen Reactions   Latex Hives and Itching    Amoxicillin Hives   Codeine Nausea And Vomiting   Penicillins Hives    REACTION: hives   Medications:  Current Outpatient Medications:    doxycycline (VIBRA-TABS) 100 MG tablet, Take 1 tablet (100 mg total) by mouth 2 (two) times daily., Disp: 20 tablet, Rfl: 0   predniSONE (DELTASONE) 20 MG tablet, Take 2 tablets (40 mg total) by mouth daily with breakfast., Disp: 10 tablet, Rfl: 0   doxepin (SINEQUAN) 10 MG/ML solution, Take 0.3-1 mL (3-10 mg total) by mouth at bedtime., Disp: 30 mL, Rfl: 1   estradiol (ESTRACE) 1 MG tablet, Take 1 tablet (1 mg total) by mouth daily., Disp: 90 tablet, Rfl: 3   lipase/protease/amylase (CREON) 36000 UNITS CPEP capsule, Take 2 capsules (72,000 Units total) by mouth 3 (three) times daily with meals. May also take 1 capsule (36,000 Units total) as needed (with snacks)., Disp: 240 capsule, Rfl: 2   Multiple Vitamins-Minerals (MULTIVITAMIN WITH MINERALS) tablet, Take by mouth., Disp: , Rfl:    nystatin (MYCOSTATIN/NYSTOP) powder, Apply topically to affected area 2 (two) times daily., Disp: 60 g, Rfl: 3   Omega-3 Fatty Acids (FISH OIL PO), Take by mouth., Disp: , Rfl:    progesterone (PROMETRIUM) 100 MG capsule, Take 1 capsule (100 mg total) by mouth daily., Disp: 90 capsule, Rfl: 3   sodium fluoride (PREVIDENT 5000 PLUS) 1.1 % CREA dental cream, Brush on teeth for 1 minute then expectorate, Disp: 51 g, Rfl: 1   triamcinolone cream (KENALOG) 0.1 %, Apply a small amount to affected area once a day as needed, Disp: 80 g, Rfl: 3   valACYclovir (VALTREX) 500 MG tablet, Take 1 tablet (500 mg total) by mouth daily., Disp: 90 tablet, Rfl: 3  Observations/Objective: Patient is well-developed, well-nourished in no acute distress.  Resting comfortably at home.  Head is normocephalic, atraumatic.  No labored breathing.  Speech is clear and coherent with logical content.  Patient is alert and oriented at baseline.  Deep, harsh, barking cough heard once during  call  Assessment and Plan: 1. Acute bacterial sinusitis - doxycycline (VIBRA-TABS) 100 MG tablet; Take 1 tablet (100 mg total) by mouth 2 (two) times daily.  Dispense: 20 tablet; Refill: 0 - predniSONE (DELTASONE) 20 MG tablet; Take 2 tablets (40 mg total) by mouth daily with breakfast.  Dispense: 10 tablet; Refill: 0  - Worsening symptoms that have not responded to OTC medications.  - Will give Doxycycline and Prednisone - Promethazine DM for cough - Continue allergy medications.  - Steam and humidifier can help - Stay well hydrated and get plenty of rest.  - Seek in person evaluation if no symptom improvement or if symptoms worsen   Follow Up Instructions: I discussed the assessment and treatment plan with the patient. The patient was provided an opportunity to ask questions and all were answered. The patient agreed with the plan and demonstrated an understanding of the instructions.  A copy of instructions were sent to the patient via MyChart unless otherwise noted below.    The patient was advised to call back or seek an in-person evaluation if the symptoms worsen or if the condition fails to improve as anticipated.  Time:  I spent 8 minutes with the patient via telehealth technology discussing the above problems/concerns.    Margaretann Loveless, PA-C

## 2022-11-13 ENCOUNTER — Other Ambulatory Visit (HOSPITAL_COMMUNITY): Payer: Self-pay

## 2022-12-24 ENCOUNTER — Other Ambulatory Visit (HOSPITAL_COMMUNITY): Payer: Self-pay

## 2022-12-24 ENCOUNTER — Other Ambulatory Visit: Payer: Self-pay

## 2022-12-24 DIAGNOSIS — Z1231 Encounter for screening mammogram for malignant neoplasm of breast: Secondary | ICD-10-CM | POA: Diagnosis not present

## 2022-12-24 DIAGNOSIS — Z01419 Encounter for gynecological examination (general) (routine) without abnormal findings: Secondary | ICD-10-CM | POA: Diagnosis not present

## 2022-12-24 LAB — HM MAMMOGRAPHY

## 2022-12-24 MED ORDER — ESTRADIOL 1 MG PO TABS
1.0000 mg | ORAL_TABLET | Freq: Every day | ORAL | 3 refills | Status: DC
  Filled 2022-12-24 – 2023-02-05 (×2): qty 90, 90d supply, fill #0
  Filled 2023-04-29: qty 90, 90d supply, fill #1
  Filled 2023-08-05: qty 90, 90d supply, fill #2
  Filled 2023-11-04: qty 90, 90d supply, fill #3

## 2022-12-24 MED ORDER — VALACYCLOVIR HCL 500 MG PO TABS
500.0000 mg | ORAL_TABLET | Freq: Every day | ORAL | 3 refills | Status: DC
Start: 2022-12-24 — End: 2024-01-07
  Filled 2022-12-24: qty 81, 81d supply, fill #0
  Filled 2022-12-24: qty 9, 9d supply, fill #0
  Filled 2023-09-12: qty 90, 90d supply, fill #0
  Filled 2023-12-13: qty 90, 90d supply, fill #1

## 2022-12-24 MED ORDER — PROGESTERONE MICRONIZED 100 MG PO CAPS
100.0000 mg | ORAL_CAPSULE | Freq: Every day | ORAL | 3 refills | Status: DC
  Filled 2022-12-24: qty 77, 77d supply, fill #0
  Filled 2022-12-24: qty 13, 13d supply, fill #0
  Filled 2023-02-05: qty 90, 90d supply, fill #0
  Filled 2023-04-29: qty 90, 90d supply, fill #1
  Filled 2023-08-05: qty 90, 90d supply, fill #2
  Filled 2023-11-04: qty 90, 90d supply, fill #3

## 2023-01-11 ENCOUNTER — Telehealth: Payer: Self-pay | Admitting: Nurse Practitioner

## 2023-01-11 ENCOUNTER — Other Ambulatory Visit: Payer: Self-pay | Admitting: Nurse Practitioner

## 2023-01-11 ENCOUNTER — Other Ambulatory Visit (HOSPITAL_COMMUNITY): Payer: Self-pay

## 2023-01-11 ENCOUNTER — Other Ambulatory Visit: Payer: Self-pay

## 2023-01-11 ENCOUNTER — Other Ambulatory Visit: Payer: Self-pay | Admitting: *Deleted

## 2023-01-11 MED ORDER — PANCRELIPASE (LIP-PROT-AMYL) 36000-114000 UNITS PO CPEP
ORAL_CAPSULE | ORAL | 2 refills | Status: DC
  Filled 2023-01-11: qty 200, 25d supply, fill #0
  Filled 2023-02-15: qty 200, 25d supply, fill #1
  Filled 2023-03-21: qty 200, 25d supply, fill #2
  Filled 2023-04-30: qty 200, 25d supply, fill #3

## 2023-01-11 NOTE — Telephone Encounter (Signed)
Instead of sending to Doc of the day, refilled medication as previously ordered via Ms. Guether,PA. Patient states she does not have enough to last through the weekend. Patient was last seen on 08/27/22.

## 2023-01-11 NOTE — Telephone Encounter (Signed)
Inbound call from patient, requesting refill for Creon

## 2023-02-04 NOTE — Progress Notes (Signed)
HPI: [name] is a 52 y.o. female with a PMHx significant for tobacco use disorder, chronic headaches, HLD, and eczema, who is here today for chronic disease management.  Last seen on 10/03/2022 for CPE  Insomnia:  Last visit she reported difficulty falling and staying asleep, doxepin was recommended.  Patient says she is still having difficulty staying asleep. She can fall asleep but wakes up a few hours later. She started doxepin at 3 mg, then tried 6 mg, and tried 10 mg one time, but says it didn't keep her asleep, so she discontinued.  She doesn't recall any constipation or other side effects with the doxepin.  She has tried Ambien in the past, but had nightmares with it so she stopped.   Cough:  Noticed some cough today.  She says her cough has improved since her last visit, but is still present. Her cough is not productive.  She mentions she missed three days of work with sinus infection a few months ago.  Completed antibiotic treatment. She is still smoking around 10 cigarettes per day.  She denies any postnasal drainage or heartburn. Negative for CP, wheezing, or dyspnea.  Review of Systems  Constitutional:  Positive for fatigue. Negative for appetite change, chills and fever.  Cardiovascular:  Negative for palpitations and leg swelling.  Gastrointestinal:  Negative for abdominal pain, nausea and vomiting.  Endocrine: Negative for cold intolerance and heat intolerance.  Genitourinary:  Negative for decreased urine volume and hematuria.  Skin:  Negative for rash.  Allergic/Immunologic: Positive for environmental allergies.  Neurological:  Negative for syncope and weakness.  See other pertinent positives and negatives in HPI.  Current Outpatient Medications on File Prior to Visit  Medication Sig Dispense Refill   doxycycline (VIBRA-TABS) 100 MG tablet Take 1 tablet (100 mg total) by mouth 2 (two) times daily. 20 tablet 0   estradiol (ESTRACE) 1 MG tablet Take 1 tablet  (1 mg total) by mouth daily. 90 tablet 3   lipase/protease/amylase (CREON) 36000 UNITS CPEP capsule Take 2 capsules (72,000 Units total) by mouth 3 (three) times daily with meals. May also take 1 capsule (36,000 Units total) as needed (with snacks). 240 capsule 2   Multiple Vitamins-Minerals (MULTIVITAMIN WITH MINERALS) tablet Take by mouth.     nystatin (MYCOSTATIN/NYSTOP) powder Apply topically to affected area 2 (two) times daily. 60 g 3   Omega-3 Fatty Acids (FISH OIL PO) Take by mouth.     predniSONE (DELTASONE) 20 MG tablet Take 2 tablets (40 mg total) by mouth daily with breakfast. 10 tablet 0   progesterone (PROMETRIUM) 100 MG capsule Take 1 capsule (100 mg total) by mouth daily. 90 capsule 3   promethazine-dextromethorphan (PROMETHAZINE-DM) 6.25-15 MG/5ML syrup Take 5 mLs by mouth 4 (four) times daily as needed. 118 mL 0   sodium fluoride (PREVIDENT 5000 PLUS) 1.1 % CREA dental cream Brush on teeth for 1 minute then expectorate 51 g 1   triamcinolone cream (KENALOG) 0.1 % Apply a small amount to affected area once a day as needed 80 g 3   valACYclovir (VALTREX) 500 MG tablet Take 1 tablet (500 mg total) by mouth daily. 90 tablet 3   valACYclovir (VALTREX) 500 MG tablet TAKE 1 TABLET BY MOUTH ONCE DAILY 90 tablet 3   [DISCONTINUED] PARoxetine (PAXIL) 10 MG tablet Take 1 tablet (10 mg total) by mouth daily. 30 tablet 1   [DISCONTINUED] venlafaxine XR (EFFEXOR XR) 37.5 MG 24 hr capsule Take 1 capsule (37.5 mg total) by  mouth daily with breakfast. 30 capsule 1   No current facility-administered medications on file prior to visit.   Past Medical History:  Diagnosis Date   Anemia    Arthritis    Complication of anesthesia    Herpes genitalis in women 2010   Lactose intolerance    MRSA infection greater than 3 months ago 2009   PONV (postoperative nausea and vomiting)    Sarcoid 1993   in remission per pt   SOBOE (shortness of breath on exertion)    Allergies  Allergen Reactions    Latex Hives and Itching   Amoxicillin Hives   Codeine Nausea And Vomiting   Penicillins Hives    REACTION: hives   Social History   Socioeconomic History   Marital status: Single    Spouse name: Not on file   Number of children: 2   Years of education: Not on file   Highest education level: GED or equivalent  Occupational History   Occupation: Psychologist, sport and exercise  Tobacco Use   Smoking status: Some Days    Current packs/day: 0.50    Average packs/day: 0.5 packs/day for 38.6 years (19.3 ttl pk-yrs)    Types: Cigarettes    Start date: 07/02/1984   Smokeless tobacco: Never   Tobacco comments:    10 cig/day  Vaping Use   Vaping status: Every Day   Substances: Nicotine, Flavoring  Substance and Sexual Activity   Alcohol use: Yes    Alcohol/week: 6.0 standard drinks of alcohol    Types: 6 Cans of beer per week    Comment: 1-2 beer    Drug use: No   Sexual activity: Yes    Birth control/protection: None, Surgical  Other Topics Concern   Not on file  Social History Narrative   Not on file   Social Determinants of Health   Financial Resource Strain: Not on file  Food Insecurity: Not on file  Transportation Needs: Not on file  Physical Activity: Not on file  Stress: Not on file  Social Connections: Not on file    Vitals:   02/05/23 0658  BP: 136/80  Pulse: 91  Resp: 16  Temp: 97.9 F (36.6 C)  SpO2: 98%   Body mass index is 30.45 kg/m.  Physical Exam Vitals and nursing note reviewed.  Constitutional:      General: She is not in acute distress.    Appearance: She is well-developed.  HENT:     Head: Normocephalic and atraumatic.     Nose: Congestion present.     Right Turbinates: Enlarged.     Left Turbinates: Enlarged.     Mouth/Throat:     Mouth: Mucous membranes are moist.     Pharynx: Oropharynx is clear. Uvula midline.  Eyes:     Conjunctiva/sclera: Conjunctivae normal.  Cardiovascular:     Rate and Rhythm: Normal rate and regular rhythm.     Heart  sounds: No murmur heard. Pulmonary:     Effort: Pulmonary effort is normal. No respiratory distress.     Breath sounds: Normal breath sounds.  Abdominal:     Palpations: Abdomen is soft. There is no mass.     Tenderness: There is no abdominal tenderness.  Musculoskeletal:     Right lower leg: No edema.     Left lower leg: No edema.  Lymphadenopathy:     Cervical: No cervical adenopathy.  Skin:    General: Skin is warm.     Findings: No erythema or rash.  Neurological:  General: No focal deficit present.     Mental Status: She is alert and oriented to person, place, and time.     Cranial Nerves: No cranial nerve deficit.     Gait: Gait normal.  Psychiatric:        Mood and Affect: Mood and affect normal.   ASSESSMENT AND PLAN:  Ms. Heimann was seen today for chronic disease management.   Orders Placed This Encounter  Procedures   CT Chest Wo Contrast   Insomnia, unspecified type Assessment & Plan: She discontinued oxybutynin because did not help, try up to 10 mg at bedtime. Ambien caused nightmares in the past. We discussed other options, she agrees with trying trazodone, starting at 25 to 50 mg and titrating up to 150 mg as needed. Continue a good sleep hygiene. Instructed to let me know in about 3 to 4 weeks if medication is helping, if not we can consider other options like Lunesta or temazepam among some.  Orders: -     traZODone HCl; Take 1-3 tablets (50-150 mg total) by mouth at bedtime as needed for sleep.  Dispense: 30 tablet; Refill: 3  Cough, persistent Last visit, 09/2022, she was also complaining of cough.  Possible causes discussed,?  COPD, allergies, postviral syndrome. Smoker since age 14, 56.3 packs/year. Due to persistent cough, recommend chest CT. Encouraged smoking cessation.  -     CT CHEST WO CONTRAST; Future  Return in about 8 months (around 10/04/2023), or if symptoms worsen or fail to improve, for CPE.  I, Suanne Marker, acting as a scribe  for Ripken Rekowski [place], MD., have documented all relevant documentation on the behalf of Laryah Neuser [place], MD, as directed by  Ashlee Player [place], MD while in the presence of Haley Fuerstenberg [place], MD.   I, Marybel Alcott [place], MD, have reviewed all documentation for this visit. The documentation on 02/05/23 for the exam, diagnosis, procedures, and orders are all accurate and complete.  Janiylah Hannis G. [place], MD  [facility]. [place] office.

## 2023-02-05 ENCOUNTER — Encounter: Payer: Self-pay | Admitting: Family Medicine

## 2023-02-05 ENCOUNTER — Ambulatory Visit (INDEPENDENT_AMBULATORY_CARE_PROVIDER_SITE_OTHER): Payer: 59 | Admitting: Family Medicine

## 2023-02-05 ENCOUNTER — Other Ambulatory Visit (HOSPITAL_COMMUNITY): Payer: Self-pay

## 2023-02-05 VITALS — BP 136/80 | HR 91 | Temp 97.9°F | Resp 16 | Ht 62.0 in | Wt 166.5 lb

## 2023-02-05 DIAGNOSIS — R053 Chronic cough: Secondary | ICD-10-CM | POA: Diagnosis not present

## 2023-02-05 DIAGNOSIS — G4700 Insomnia, unspecified: Secondary | ICD-10-CM | POA: Diagnosis not present

## 2023-02-05 MED ORDER — TRAZODONE HCL 50 MG PO TABS
50.0000 mg | ORAL_TABLET | Freq: Every evening | ORAL | 3 refills | Status: DC | PRN
  Filled 2023-02-05: qty 30, 10d supply, fill #0

## 2023-02-05 NOTE — Patient Instructions (Signed)
A few things to remember from today's visit:  Cough, persistent - Plan: CT Chest Wo Contrast  Insomnia, unspecified type Try Trazodone from 25 mg to 150 mg, 30-45 min before bedtime. Please let me know in 3-4 weeks if med is helping with sleep. Chest CT is going to be arranged.  If you need refills for medications you take chronically, please call your pharmacy. Do not use My Chart to request refills or for acute issues that need immediate attention. If you send a my chart message, it may take a few days to be addressed, specially if I am not in the office.  Please be sure medication list is accurate. If a new problem present, please set up appointment sooner than planned today.

## 2023-02-05 NOTE — Assessment & Plan Note (Signed)
She discontinued oxybutynin because did not help, try up to 10 mg at bedtime. Ambien caused nightmares in the past. We discussed other options, she agrees with trying trazodone, starting at 25 to 50 mg and titrating up to 150 mg as needed. Continue a good sleep hygiene. Instructed to let me know in about 3 to 4 weeks if medication is helping, if not we can consider other options like Lunesta or temazepam among some.

## 2023-02-09 ENCOUNTER — Ambulatory Visit (HOSPITAL_BASED_OUTPATIENT_CLINIC_OR_DEPARTMENT_OTHER)
Admission: RE | Admit: 2023-02-09 | Discharge: 2023-02-09 | Disposition: A | Discharge status: Home / Self Care | Payer: 59 | Source: Ambulatory Visit | Attending: Family Medicine | Admitting: Family Medicine

## 2023-02-09 DIAGNOSIS — D3502 Benign neoplasm of left adrenal gland: Secondary | ICD-10-CM | POA: Diagnosis not present

## 2023-02-09 DIAGNOSIS — J439 Emphysema, unspecified: Secondary | ICD-10-CM | POA: Diagnosis not present

## 2023-02-09 DIAGNOSIS — R053 Chronic cough: Secondary | ICD-10-CM | POA: Insufficient documentation

## 2023-03-09 ENCOUNTER — Telehealth: Payer: 59 | Admitting: Family Medicine

## 2023-03-09 DIAGNOSIS — J069 Acute upper respiratory infection, unspecified: Secondary | ICD-10-CM

## 2023-03-09 MED ORDER — FLUTICASONE PROPIONATE 50 MCG/ACT NA SUSP: 2.0000 | Freq: Every day | NASAL | 6 refills | Status: AC

## 2023-03-09 NOTE — Progress Notes (Signed)

## 2023-03-12 ENCOUNTER — Encounter: Payer: Self-pay | Admitting: Family Medicine

## 2023-03-12 ENCOUNTER — Ambulatory Visit (INDEPENDENT_AMBULATORY_CARE_PROVIDER_SITE_OTHER): Payer: 59 | Admitting: Family Medicine

## 2023-03-12 ENCOUNTER — Other Ambulatory Visit (HOSPITAL_COMMUNITY): Payer: Self-pay

## 2023-03-12 VITALS — BP 120/80 | HR 88 | Temp 98.1°F | Resp 12 | Ht 62.0 in | Wt 166.5 lb

## 2023-03-12 DIAGNOSIS — R0981 Nasal congestion: Secondary | ICD-10-CM

## 2023-03-12 DIAGNOSIS — J069 Acute upper respiratory infection, unspecified: Secondary | ICD-10-CM

## 2023-03-12 MED ORDER — PREDNISONE 20 MG PO TABS
40.0000 mg | ORAL_TABLET | Freq: Every day | ORAL | 0 refills | Status: AC
  Filled 2023-03-12: qty 6, 3d supply, fill #0

## 2023-03-12 NOTE — Patient Instructions (Addendum)
 A few things to remember from today's visit:  URI, acute  Nasal sinus congestion - Plan: predniSONE  (DELTASONE ) 20 MG tablet  Nasal irrigations as needed through the day. Flonase  nasal spray daily at bedtime for 10-14 days. Prednisone  with breakfast for 3 days. Mucinex D for 10-14 days. I do not think antibiotic is needed at this time. If any fever or worsening facial pain, or not any better in 7 days , we could try but will not help woth congestion.  If you need refills for medications you take chronically, please call your pharmacy. Do not use My Chart to request refills or for acute issues that need immediate attention. If you send a my chart message, it may take a few days to be addressed, specially if I am not in the office.  Please be sure medication list is accurate. If a new problem present, please set up appointment sooner than planned today.

## 2023-03-12 NOTE — Progress Notes (Signed)
 ACUTE VISIT Chief Complaint  Patient presents with   Sinus Problem    Started on Friday, has been using Alka Seltzer & Mucinex.    HPI: [name] is a 53 y.o. female with a PMHx significant for tobacco use disorder, chronic headaches, HLD, and eczema, who is here today complaining of sinus issues.   Patient complains of nasal congestion, rhinorrhea, sneezing, cough, postnasal drainage, little frontal pressure headache, voice changes, and some left ear fullness sensation since 1/3.   Sinus Problem This is a new problem. The current episode started in the past 7 days. The problem is unchanged. There has been no fever. The pain is mild. Associated symptoms include congestion, coughing, headaches, sinus pressure and sneezing. Pertinent negatives include no chills, diaphoresis, ear pain, neck pain, shortness of breath or swollen glands. The treatment provided mild relief.   She has been taking alka seltzer.  No history of seasonal allergies.  Pertinent negatives include fever, chills, body aches, wheezing, SOB, or known sick contacts.   She is working on stopping smoking.   Review of Systems  Constitutional:  Negative for chills and diaphoresis.  HENT:  Positive for congestion, sinus pressure and sneezing. Negative for ear pain.   Eyes:  Negative for discharge and itching.  Respiratory:  Positive for cough. Negative for shortness of breath.   Cardiovascular:  Negative for chest pain and leg swelling.  Gastrointestinal:  Negative for abdominal pain, nausea and vomiting.  Genitourinary:  Negative for decreased urine volume, dysuria and hematuria.  Musculoskeletal:  Negative for neck pain.  Skin:  Negative for rash.  Allergic/Immunologic: Positive for environmental allergies.  Neurological:  Positive for headaches. Negative for syncope, facial asymmetry and weakness.  See other pertinent positives and negatives in HPI.  Current Outpatient Medications on File Prior to Visit   Medication Sig Dispense Refill   estradiol  (ESTRACE ) 1 MG tablet Take 1 tablet (1 mg total) by mouth daily. 90 tablet 3   fluticasone  (FLONASE ) 50 MCG/ACT nasal spray Place 2 sprays into both nostrils daily. 16 g 6   lipase/protease/amylase (CREON ) 36000 UNITS CPEP capsule Take 2 capsules (72,000 Units total) by mouth 3 (three) times daily with meals. May also take 1 capsule (36,000 Units total) as needed (with snacks). 240 capsule 2   Multiple Vitamins-Minerals (MULTIVITAMIN WITH MINERALS) tablet Take by mouth.     nystatin  (MYCOSTATIN /NYSTOP ) powder Apply topically to affected area 2 (two) times daily. 60 g 3   Omega-3 Fatty Acids (FISH OIL PO) Take by mouth.     progesterone  (PROMETRIUM ) 100 MG capsule Take 1 capsule (100 mg total) by mouth daily. 90 capsule 3   sodium fluoride  (PREVIDENT 5000 PLUS) 1.1 % CREA dental cream Brush on teeth for 1 minute then expectorate 51 g 1   traZODone  (DESYREL ) 50 MG tablet Take 1-3 tablets (50-150 mg total) by mouth at bedtime as needed for sleep. 30 tablet 3   triamcinolone  cream (KENALOG ) 0.1 % Apply a small amount to affected area once a day as needed 80 g 3   valACYclovir  (VALTREX ) 500 MG tablet Take 1 tablet (500 mg total) by mouth daily. 90 tablet 3   valACYclovir  (VALTREX ) 500 MG tablet TAKE 1 TABLET BY MOUTH ONCE DAILY 90 tablet 3   [DISCONTINUED] PARoxetine  (PAXIL ) 10 MG tablet Take 1 tablet (10 mg total) by mouth daily. 30 tablet 1   [DISCONTINUED] venlafaxine  XR (EFFEXOR  XR) 37.5 MG 24 hr capsule Take 1 capsule (37.5 mg total) by mouth daily with  breakfast. 30 capsule 1   No current facility-administered medications on file prior to visit.    Past Medical History:  Diagnosis Date   Anemia    Arthritis    Complication of anesthesia    Herpes genitalis in women 2010   Lactose intolerance    MRSA infection greater than 3 months ago 2009   PONV (postoperative nausea and vomiting)    Sarcoid 1993   in remission per pt   SOBOE (shortness of  breath on exertion)    Allergies  Allergen Reactions   Latex Hives and Itching   Amoxicillin Hives   Codeine  Nausea And Vomiting   Penicillins Hives    REACTION: hives    Social History   Socioeconomic History   Marital status: Single    Spouse name: Not on file   Number of children: 2   Years of education: Not on file   Highest education level: GED or equivalent  Occupational History   Occupation: Psychologist, [employer]  Tobacco Use   Smoking status: Some Days    Current packs/day: 0.50    Average packs/day: 0.5 packs/day for 38.7 years (19.3 ttl pk-yrs)    Types: Cigarettes    Start date: 07/02/1984   Smokeless tobacco: Never   Tobacco comments:    10 cig/day  Vaping Use   Vaping status: Every Day   Substances: Nicotine, Flavoring  Substance and Sexual Activity   Alcohol use: Yes    Alcohol/week: 6.0 standard drinks of alcohol    Types: 6 Cans of beer per week    Comment: 1-2 beer    Drug use: No   Sexual activity: Yes    Birth control/protection: None, Surgical  Other Topics Concern   Not on file  Social History Narrative   Not on file   Social Drivers of Health   Financial Resource Strain: Not on file  Food Insecurity: Not on file  Transportation Needs: Not on file  Physical Activity: Not on file  Stress: Not on file  Social Connections: Not on file    Vitals:   03/12/23 0856  BP: 120/80  Pulse: 88  Resp: 12  Temp: 98.1 F (36.7 C)  SpO2: 98%   Body mass index is 30.45 kg/m.  Physical Exam Vitals and nursing note reviewed.  Constitutional:      General: She is not in acute distress.    Appearance: She is well-developed. She is not ill-appearing.  HENT:     Head: Normocephalic and atraumatic.     Right Ear: Tympanic membrane, ear canal and external ear normal.     Left Ear: Tympanic membrane, ear canal and external ear normal.     Nose: Congestion and rhinorrhea present.     Right Turbinates: Enlarged.     Left Turbinates: Enlarged.     Right  Sinus: No maxillary sinus tenderness or frontal sinus tenderness.     Left Sinus: No maxillary sinus tenderness or frontal sinus tenderness.     Mouth/Throat:     Mouth: Mucous membranes are moist.     Pharynx: Postnasal drip present. No posterior oropharyngeal erythema.     Comments: Mild dysphonia. Eyes:     Conjunctiva/sclera: Conjunctivae normal.  Cardiovascular:     Rate and Rhythm: Normal rate and regular rhythm.     Heart sounds: No murmur heard. Pulmonary:     Effort: Pulmonary effort is normal. No respiratory distress.     Breath sounds: Normal breath sounds. No stridor.  Lymphadenopathy:  Head:     Right side of head: No submandibular adenopathy.     Left side of head: No submandibular adenopathy.     Cervical: No cervical adenopathy.  Skin:    General: Skin is warm.     Findings: No erythema or rash.  Neurological:     General: No focal deficit present.     Mental Status: She is alert and oriented to person, place, and time.     Gait: Gait normal.  Psychiatric:        Mood and Affect: Mood and affect normal.   ASSESSMENT AND PLAN:  Ms. Leard was seen today for sinus problems.   Nasal sinus congestion We discussed possible etiologies. Hx and examination today do not suggest a bacterial sinus infection, so I do not think abx is needed at this time. It can be considered if symptoms are not any better in 7 days or fever presents. OTC Mucinex D daily x 10-14 days may help, some side effects discussed. Flonase  nasal spray daily at bedtime for 10-14 days then pen and nasal saline irrigations as needed. Short course of Prednisone  will also help.  -     predniSONE ; Take 2 tablets (40 mg total) by mouth daily with breakfast for 3 days.  Dispense: 6 tablet; Refill: 0  URI, acute Symptoms suggests a viral etiology, symptomatic treatment recommended. Allergies could be aggravating symptoms. Instructed to monitor for signs of complications, including new onset of fever  among some, clearly instructed about warning signs. I also explained that cough and nasal congestion can last a few days and sometimes weeks. F/U as needed.  Return if symptoms worsen or fail to improve.  I, Leonce PARAS Wierda, acting as a scribe for Delorean Knutzen, MD., have documented all relevant documentation on the behalf of Raynisha Avilla, MD, as directed by  Brisa Auth, MD while in the presence of Markavious Micco, MD.   I, Floraine Buechler, MD, have reviewed all documentation for this visit. The documentation on 03/12/23 for the exam, diagnosis, procedures, and orders are all accurate and complete.  Sahaj Bona G. Afia Messenger, MD  [facility] [place] office.

## 2023-03-21 ENCOUNTER — Other Ambulatory Visit (HOSPITAL_COMMUNITY): Payer: Self-pay

## 2023-04-10 DIAGNOSIS — N950 Postmenopausal bleeding: Secondary | ICD-10-CM | POA: Diagnosis not present

## 2023-04-30 ENCOUNTER — Other Ambulatory Visit (HOSPITAL_COMMUNITY): Payer: Self-pay

## 2023-05-01 ENCOUNTER — Other Ambulatory Visit (HOSPITAL_COMMUNITY): Payer: Self-pay

## 2023-05-01 ENCOUNTER — Telehealth: Payer: Self-pay | Admitting: Nurse Practitioner

## 2023-05-01 ENCOUNTER — Other Ambulatory Visit: Payer: Self-pay | Admitting: Nurse Practitioner

## 2023-05-01 MED ORDER — PANCRELIPASE (LIP-PROT-AMYL) 36000-114000 UNITS PO CPEP
ORAL_CAPSULE | ORAL | 2 refills | Status: DC
  Filled 2023-05-01: qty 200, 25d supply, fill #0
  Filled 2023-06-05: qty 200, 25d supply, fill #1
  Filled 2023-07-14: qty 200, 25d supply, fill #2
  Filled 2023-08-19: qty 200, 25d supply, fill #3
  Filled 2023-08-21: qty 100, 13d supply, fill #3

## 2023-05-01 NOTE — Telephone Encounter (Signed)
 Inbound call from patient, requesting refill for Creon

## 2023-06-05 ENCOUNTER — Other Ambulatory Visit: Payer: Self-pay

## 2023-08-15 NOTE — Progress Notes (Signed)
 08/19/2023 Kathleen Vang [id number] 02/02/1971  Referring provider: [place], Betty G, MD Primary GI doctor: Dr. Leonia Raman  ASSESSMENT AND PLAN:  Change in bowel habits/diarrhea, possible pancreatic insufficiency, with fecal incontinence on occasion 11/05/2018 colonoscopy for screening mild diverticulosis sigmoid descending colon nonbleeding internal hemorrhoids otherwise normal no specimens recall 10 years, recall 10 years 08/14/2022 Giardia, celiac and thyroid  unremarkable pancreatic elastase 154 started on Creon  36,000 2 pills with meals States the creon  has helped some, she continues to have diarrhea primarily in the morning and after eating, worse after greasy foods Has at least 4 stools a day, can be oily/greasy No AB pain, has bloating, no nausea, vomiting, weight loss ETOH 3 x a week, tobacco use, no drug use -Uncertain if this is true pancreatic insuff but patient has had improvement with the Creon , continue for now, will check fecal fat and CT AB and pelvis since patient has no risk factors -Stop ETOH -Will do xifaxin for possible IBS-C, if expensive can switch to flagyl 250 mg TID for 10 days.  -Add on benefiber, given fodmap, avoid lactose -Consider repeat colonoscopy with biopsies pending results  Pulmonary sarcoidosis Well controlled, advised to stop smoking  Patient Care Team: [place], Betty G, MD as PCP - General (Family Medicine)  HISTORY OF PRESENT ILLNESS: 53 y.o. female with a past medical history listed below presents for evaluation of diarrhea.   Last seen in the office 08/14/2022 by Mai Schwalbe, NP for change in bowel habits.  Discussed the use of AI scribe software for clinical note transcription with the patient, who gave verbal consent to proceed.  History of Present Illness   Kathleen Vang is a 53 year old female who presents with chronic diarrhea.  She has been experiencing chronic diarrhea since at least June 2024. She was previously evaluated and  started on Creon , a pancreatic enzyme replacement, due to low pancreatic enzyme levels. While Creon  has reduced the frequency of her bowel movements by 50-75%, she continues to have up to four stools per day, particularly after consuming certain foods. Her stools are sometimes oily or greasy and have an 'oatmealish' consistency. She experiences accidents, particularly when she mistakes gas for stool, which has occurred at work. Greasy foods, such as pizza and [place] food, exacerbate her symptoms, while less greasy foods like hamburgers and pasta do not affect her as much.  She takes Creon  at a dose of two capsules with breakfast, one with a snack, and two with dinner, usually after the first bite of food. She eats three meals a day and sometimes a snack. Despite the treatment, she occasionally experiences nighttime symptoms and has had to carry incontinence products due to accidents.  No abdominal discomfort, bloating, nausea, vomiting, weight loss, or history of pancreatitis. She reports increased gas, which she attributes to dietary choices. She has no known family history of pancreatic or gallbladder issues, as her immediate family members are deceased.  In her social history, she consumes beer, approximately two 12-ounce cans, three times a week, and smokes cigarettes. She denies any drug use. Her past medical history includes pulmonary sarcoidosis, which has been in remission for many years without treatment.      She  reports that she has been smoking cigarettes. She started smoking about 39 years ago. She has a 19.6 pack-year smoking history. She has never used smokeless tobacco. She reports current alcohol use of about 6.0 standard drinks of alcohol per week. She reports that she does not use drugs.  RELEVANT GI HISTORY, IMAGING AND LABS: Results   LABS Pancreatic elastase: low (08/27/2022)  DIAGNOSTIC Colonoscopy: diverticulosis and hemorrhoids (2020)      CBC    Component Value  Date/Time   WBC 13.4 (H) 06/01/2020 0849   WBC 5.6 10/02/2018 0849   RBC 4.63 06/01/2020 0849   RBC 4.18 10/02/2018 0849   HGB 15.4 06/01/2020 0849   HCT 44.2 06/01/2020 0849   PLT 314 06/01/2020 0849   MCV 96 06/01/2020 0849   MCH 33.3 (H) 06/01/2020 0849   MCH 32.8 10/02/2018 0849   MCHC 34.8 06/01/2020 0849   MCHC 33.1 10/02/2018 0849   RDW 11.6 (L) 06/01/2020 0849   LYMPHSABS 1.2 06/01/2020 0849   MONOABS 0.6 10/02/2018 0849   EOSABS 0.0 06/01/2020 0849   BASOSABS 0.0 06/01/2020 0849   No results for input(s): HGB in the last 8760 hours.  CMP     Component Value Date/Time   NA 142 10/03/2022 0805   NA 140 06/01/2020 0849   K 3.8 10/03/2022 0805   CL 108 10/03/2022 0805   CO2 28 10/03/2022 0805   GLUCOSE 95 10/03/2022 0805   BUN 6 10/03/2022 0805   BUN 10 06/01/2020 0849   CREATININE 0.82 10/03/2022 0805   CALCIUM 9.2 10/03/2022 0805   PROT 7.2 10/03/2022 0805   PROT 7.2 06/01/2020 0849   ALBUMIN 4.1 10/03/2022 0805   ALBUMIN 4.4 06/01/2020 0849   AST 24 10/03/2022 0805   ALT 16 10/03/2022 0805   ALKPHOS 44 10/03/2022 0805   BILITOT 0.4 10/03/2022 0805   BILITOT 0.4 06/01/2020 0849   GFRNONAA >60 10/02/2018 0849   GFRAA >60 10/02/2018 0849      Latest Ref Rng & Units 10/03/2022    8:05 AM 06/01/2020    8:49 AM 10/02/2018    8:49 AM  Hepatic Function  Total Protein 6.0 - 8.3 g/dL 7.2  7.2  6.8   Albumin 3.5 - 5.2 g/dL 4.1  4.4  3.8   AST 0 - 37 U/L 24  27  29    ALT 0 - 35 U/L 16  13  22    Alk Phosphatase 39 - 117 U/L 44  73  42   Total Bilirubin 0.2 - 1.2 mg/dL 0.4  0.4  0.8       Current Medications:   Current Outpatient Medications (Endocrine & Metabolic):    estradiol  (ESTRACE ) 1 MG tablet, Take 1 tablet (1 mg total) by mouth daily.   progesterone  (PROMETRIUM ) 100 MG capsule, Take 1 capsule (100 mg total) by mouth daily.   Current Outpatient Medications (Respiratory):    fluticasone  (FLONASE ) 50 MCG/ACT nasal spray, Place 2 sprays into both  nostrils daily.    Current Outpatient Medications (Other):    lipase/protease/amylase (CREON ) 36000 UNITS CPEP capsule, Take 2 capsules (72,000 Units total) by mouth 3 (three) times daily with meals. May also take 1 capsule (36,000 Units total) 2 (two) times daily with snacks as needed.   Multiple Vitamins-Minerals (MULTIVITAMIN WITH MINERALS) tablet, Take by mouth.   rifaximin (XIFAXAN) 550 MG TABS tablet, Take 1 tablet (550 mg total) by mouth 3 (three) times daily for 14 days.   valACYclovir  (VALTREX ) 500 MG tablet, Take 1 tablet (500 mg total) by mouth daily.   valACYclovir  (VALTREX ) 500 MG tablet, TAKE 1 TABLET BY MOUTH ONCE DAILY   nystatin  (MYCOSTATIN /NYSTOP ) powder, Apply topically to affected area 2 (two) times daily. (Patient not taking: Reported on 08/19/2023)   Omega-3 Fatty Acids (FISH OIL  PO), Take by mouth. (Patient not taking: Reported on 08/19/2023)   sodium fluoride  (PREVIDENT 5000 PLUS) 1.1 % CREA dental cream, Brush on teeth for 1 minute then expectorate (Patient not taking: Reported on 08/19/2023)   traZODone  (DESYREL ) 50 MG tablet, Take 1-3 tablets (50-150 mg total) by mouth at bedtime as needed for sleep. (Patient not taking: Reported on 08/19/2023)   triamcinolone  cream (KENALOG ) 0.1 %, Apply a small amount to affected area once a day as needed (Patient not taking: Reported on 08/19/2023)  Medical History:  Past Medical History:  Diagnosis Date   Anemia    Arthritis    Complication of anesthesia    Herpes genitalis in women 2010   Lactose intolerance    MRSA infection greater than 3 months ago 2009   PONV (postoperative nausea and vomiting)    Sarcoid 1993   in remission per pt   SOBOE (shortness of breath on exertion)    Allergies:  Allergies  Allergen Reactions   Latex Hives and Itching   Amoxicillin Hives   Codeine  Nausea And Vomiting   Penicillins Hives    REACTION: hives     Surgical History:  She  has a past surgical history that includes Lymph node  biopsy (1993); Tubal ligation (1988); Gastroc recession extremity (Left, 07/30/2019); Calcaneal osteotomy (Left, 07/30/2019); and Posterior tibial tendon repair (Left, 07/30/2019). Family History:  Her family history includes Alcoholism in her father; Cancer in her mother; Hypertension in her father; Other in her mother; Stroke in her father.  REVIEW OF SYSTEMS  : All other systems reviewed and negative except where noted in the History of Present Illness.  PHYSICAL EXAM: BP 120/80   Pulse 77   Ht 5' 2 (1.575 m)   Wt 168 lb (76.2 kg)   LMP  (LMP Unknown)   BMI 30.73 kg/m  Physical Exam   GENERAL APPEARANCE: Well nourished, in no apparent distress. HEENT: No cervical lymphadenopathy, unremarkable thyroid , sclerae anicteric, conjunctiva pink. RESPIRATORY: Respiratory effort normal, breath sounds clear to auscultation bilaterally without rales, rhonchi, or wheezing. CARDIO: Regular rate and rhythm with no murmurs, rubs, or gallops, peripheral pulses intact. ABDOMEN: Soft, non-distended, active bowel sounds in all four quadrants, no tenderness to palpation, no rebound, no mass appreciated. RECTAL: Declines. MUSCULOSKELETAL: Full range of motion, normal gait, without edema. SKIN: Dry, intact without rashes or lesions. No jaundice. NEURO: Alert, oriented, no focal deficits. PSYCH: Cooperative, normal mood and affect.      Edmonia Gottron, PA-C 9:14 AM

## 2023-08-19 ENCOUNTER — Telehealth: Payer: Self-pay

## 2023-08-19 ENCOUNTER — Other Ambulatory Visit: Payer: Self-pay | Admitting: Nurse Practitioner

## 2023-08-19 ENCOUNTER — Other Ambulatory Visit (HOSPITAL_COMMUNITY): Payer: Self-pay

## 2023-08-19 ENCOUNTER — Other Ambulatory Visit

## 2023-08-19 ENCOUNTER — Telehealth: Payer: Self-pay | Admitting: Physician Assistant

## 2023-08-19 ENCOUNTER — Ambulatory Visit (INDEPENDENT_AMBULATORY_CARE_PROVIDER_SITE_OTHER): Admitting: Physician Assistant

## 2023-08-19 ENCOUNTER — Encounter: Payer: Self-pay | Admitting: Physician Assistant

## 2023-08-19 ENCOUNTER — Encounter (HOSPITAL_COMMUNITY): Payer: Self-pay

## 2023-08-19 VITALS — BP 120/80 | HR 77 | Ht 62.0 in | Wt 168.0 lb

## 2023-08-19 DIAGNOSIS — D860 Sarcoidosis of lung: Secondary | ICD-10-CM | POA: Diagnosis not present

## 2023-08-19 DIAGNOSIS — F17210 Nicotine dependence, cigarettes, uncomplicated: Secondary | ICD-10-CM

## 2023-08-19 DIAGNOSIS — Z8719 Personal history of other diseases of the digestive system: Secondary | ICD-10-CM

## 2023-08-19 DIAGNOSIS — K580 Irritable bowel syndrome with diarrhea: Secondary | ICD-10-CM

## 2023-08-19 DIAGNOSIS — R1084 Generalized abdominal pain: Secondary | ICD-10-CM

## 2023-08-19 DIAGNOSIS — R194 Change in bowel habit: Secondary | ICD-10-CM

## 2023-08-19 DIAGNOSIS — F1090 Alcohol use, unspecified, uncomplicated: Secondary | ICD-10-CM | POA: Diagnosis not present

## 2023-08-19 DIAGNOSIS — R197 Diarrhea, unspecified: Secondary | ICD-10-CM

## 2023-08-19 DIAGNOSIS — R159 Full incontinence of feces: Secondary | ICD-10-CM

## 2023-08-19 MED ORDER — RIFAXIMIN 550 MG PO TABS
550.0000 mg | ORAL_TABLET | Freq: Three times a day (TID) | ORAL | 0 refills | Status: DC
  Filled 2023-08-19 – 2023-08-21 (×2): qty 42, 14d supply, fill #0

## 2023-08-19 NOTE — Telephone Encounter (Signed)
 PA request has been Started. New Encounter has been or will be created for follow up. For additional info see Pharmacy Prior Auth telephone encounter from 08/19/2023.

## 2023-08-19 NOTE — Telephone Encounter (Signed)
 Pharmacy Patient Advocate Encounter   Received notification from Pt Calls Messages that prior authorization for Xifaxan 550MG  tablets is required/requested.   Insurance verification completed.   The patient is insured through [facility] .   Per test claim: Prior Authorization form/request asks a question that requires your assistance. Please see the question below and advise accordingly. The PA will not be submitted until the necessary information is received.

## 2023-08-19 NOTE — Patient Instructions (Signed)
  Your provider has requested that you go to the basement level for lab work before leaving today. Press B on the elevator. The lab is located at the first door on the left as you exit the elevator.  FIBER SUPPLEMENT You can do metamucil or fibercon once or twice a day but if this causes gas/bloating please switch to Benefiber or Citracel.  Fiber is good for constipation/diarrhea/irritable bowel syndrome.  It can also help with weight loss and can help lower your bad cholesterol (LDL).  Please do 1 TBSP in the morning in water, coffee, or tea.  It can take up to a month before you can see a difference with your bowel movements.  It is cheapest from [employer], [employer], [employer].   First do a trial off milk/lactose products if you use them.  Add fiber like benefiber or citracel once a day Increase activity    FODMAP stands for fermentable oligo-, di-, mono-saccharides and polyols (1). These are the scientific terms used to classify groups of carbs that are difficult for our body to digest and that are notorious for triggering digestive symptoms like bloating, gas, loose stools and stomach pain.   You can try low FODMAP diet  - start with eliminating just one column at a time that you feel may be a trigger for you. - the table at the very bottom contains foods that are low in FODMAPs   Sometimes trying to eliminate the FODMAP's from your diet is difficult or tricky, if you are stuggling with trying to do the elimination diet you can try an enzyme.  There is a food enzymes that you sprinkle in or on your food that helps break down the FODMAP. You can read more about the enzyme by going to this site: https://fodzyme.com/   You have been scheduled for a CT scan of the abdomen and pelvis at [facility], 1st floor Radiology. You are scheduled on 08/28/2023 at 5:30 pm . You should arrive 15 minutes prior to your appointment time for registration.    Due to recent changes in healthcare  laws, you may see the results of your imaging and laboratory studies on MyChart before your provider has had a chance to review them.  We understand that in some cases there may be results that are confusing or concerning to you. Not all laboratory results come back in the same time frame and the provider may be waiting for multiple results in order to interpret others.  Please give us  48 hours in order for your provider to thoroughly review all the results before contacting the office for clarification of your results.    I appreciate the  opportunity to care for you  Thank You   [facility]

## 2023-08-19 NOTE — Telephone Encounter (Signed)
 PT is calling to let us  know that Xifaxin prescription needs to have a prior authorization for it. Please advise.

## 2023-08-20 NOTE — Telephone Encounter (Signed)
 FYI RX Team    Edmonia Gottron, PA-C to Orrell, Ashley N, CPhT AC    08/19/23  3:46 PM Patient has failed dicyclomine and is unable to take a TCA.  Thanks, Mylinda Asa

## 2023-08-21 ENCOUNTER — Other Ambulatory Visit (HOSPITAL_COMMUNITY): Payer: Self-pay

## 2023-08-21 ENCOUNTER — Other Ambulatory Visit: Payer: Self-pay | Admitting: Physician Assistant

## 2023-08-21 ENCOUNTER — Telehealth: Payer: Self-pay | Admitting: Physician Assistant

## 2023-08-21 MED ORDER — PANCRELIPASE (LIP-PROT-AMYL) 36000-114000 UNITS PO CPEP
ORAL_CAPSULE | ORAL | 2 refills | Status: DC
  Filled 2023-08-21: qty 240, fill #0
  Filled 2023-09-08: qty 200, 25d supply, fill #0
  Filled 2023-10-11: qty 200, 25d supply, fill #1
  Filled 2023-11-18: qty 200, 25d supply, fill #2
  Filled 2023-12-24: qty 100, 12d supply, fill #3
  Filled 2024-01-16: qty 100, 12d supply, fill #4

## 2023-08-21 NOTE — Telephone Encounter (Signed)
 Returned patients call, I have sent the creon  to patients requested pharmacy. Patient is wondering about her Xifaxian and it should be in the middle of getting a prior authorization  Rx team have you seen this request     Thanks

## 2023-08-21 NOTE — Telephone Encounter (Signed)
 Patient called and stated that she is needing a refill on her creon . Patient is requesting a call back once that has been sent to her pharmacy. Please advise.

## 2023-08-21 NOTE — Telephone Encounter (Signed)
 Pharmacy Patient Advocate Encounter   Per test claim: PA required; PA submitted to above mentioned insurance via CoverMyMeds Key/confirmation #/EOC [id number] Status is pending

## 2023-08-22 ENCOUNTER — Ambulatory Visit: Payer: Self-pay | Admitting: Physician Assistant

## 2023-08-22 LAB — FECAL FAT, QUALITATIVE
Fat Qual Neutral, Stl: NORMAL
Fat Qual Total, Stl: NORMAL

## 2023-08-22 NOTE — Telephone Encounter (Signed)
 Yes, this is already started in another encounter.

## 2023-08-22 NOTE — Telephone Encounter (Signed)
 I explained to the patient that the prior authorization is in process with the Rx team

## 2023-08-23 ENCOUNTER — Other Ambulatory Visit (HOSPITAL_COMMUNITY): Payer: Self-pay

## 2023-08-23 MED ORDER — METRONIDAZOLE 250 MG PO TABS
250.0000 mg | ORAL_TABLET | Freq: Three times a day (TID) | ORAL | 0 refills | Status: AC
  Filled 2023-08-23: qty 30, 10d supply, fill #0

## 2023-08-23 NOTE — Addendum Note (Signed)
 Addended by: Santina Cull on: 08/23/2023 03:09 PM   Modules accepted: Orders

## 2023-08-23 NOTE — Telephone Encounter (Signed)
 Pharmacy Patient Advocate Encounter  Received notification from [facility] that Prior Authorization for  Xifaxan 550MG  tablets has been DENIED.  Full denial letter will be uploaded to the media tab. See denial reason below.  Our guideline named RIFAXIMIN (Xifaxan 550mg  tablets) requires the following rule(s) be met for approval:  You have tried or have a contraindication to (harmful for you to use) the preferred medication for the treatment of irritable bowel syndrome with diarrhea (IBS-D; an intestinal problem causing pain in the belly, gas, and diarrhea): Viberzi (eluxadoline).  PA #/Case ID/Reference #: [id number]

## 2023-08-23 NOTE — Telephone Encounter (Signed)
 Pt is active on mychart, message sent to pt. Prescription already sent in.

## 2023-08-28 ENCOUNTER — Ambulatory Visit (HOSPITAL_COMMUNITY)
Admission: RE | Admit: 2023-08-28 | Discharge: 2023-08-28 | Disposition: A | Discharge status: Home / Self Care | Source: Ambulatory Visit | Attending: Physician Assistant | Admitting: Physician Assistant

## 2023-08-28 DIAGNOSIS — R1084 Generalized abdominal pain: Secondary | ICD-10-CM | POA: Insufficient documentation

## 2023-08-28 DIAGNOSIS — D3502 Benign neoplasm of left adrenal gland: Secondary | ICD-10-CM | POA: Diagnosis not present

## 2023-08-28 DIAGNOSIS — R140 Abdominal distension (gaseous): Secondary | ICD-10-CM | POA: Diagnosis not present

## 2023-08-28 DIAGNOSIS — D259 Leiomyoma of uterus, unspecified: Secondary | ICD-10-CM | POA: Diagnosis not present

## 2023-08-28 DIAGNOSIS — R1032 Left lower quadrant pain: Secondary | ICD-10-CM | POA: Diagnosis not present

## 2023-08-28 MED ORDER — IOHEXOL 300 MG/ML  SOLN
100.0000 mL | Freq: Once | INTRAMUSCULAR | Status: AC | PRN
  Administered 2023-08-28: 100 mL via INTRAVENOUS

## 2023-08-28 MED ORDER — SODIUM CHLORIDE (PF) 0.9 % IJ SOLN
INTRAMUSCULAR | Status: AC
  Filled 2023-08-28: qty 50

## 2023-09-09 ENCOUNTER — Other Ambulatory Visit (HOSPITAL_COMMUNITY): Payer: Self-pay

## 2023-09-12 ENCOUNTER — Other Ambulatory Visit (HOSPITAL_COMMUNITY): Payer: Self-pay

## 2023-10-11 ENCOUNTER — Other Ambulatory Visit: Payer: Self-pay

## 2023-10-22 ENCOUNTER — Ambulatory Visit (INDEPENDENT_AMBULATORY_CARE_PROVIDER_SITE_OTHER): Admitting: Physician Assistant

## 2023-10-22 ENCOUNTER — Encounter: Payer: Self-pay | Admitting: Physician Assistant

## 2023-10-22 ENCOUNTER — Other Ambulatory Visit (HOSPITAL_COMMUNITY): Payer: Self-pay

## 2023-10-22 VITALS — BP 110/80 | HR 82 | Ht 62.0 in | Wt 165.0 lb

## 2023-10-22 DIAGNOSIS — K529 Noninfective gastroenteritis and colitis, unspecified: Secondary | ICD-10-CM | POA: Diagnosis not present

## 2023-10-22 DIAGNOSIS — F17210 Nicotine dependence, cigarettes, uncomplicated: Secondary | ICD-10-CM | POA: Diagnosis not present

## 2023-10-22 MED ORDER — NA SULFATE-K SULFATE-MG SULF 17.5-3.13-1.6 GM/177ML PO SOLN
1.0000 | Freq: Once | ORAL | 0 refills | Status: AC
  Filled 2023-10-22: qty 354, 2d supply, fill #0

## 2023-10-22 NOTE — Patient Instructions (Signed)
 You have been scheduled for a colonoscopy. Please follow written instructions given to you at your visit today.   If you use inhalers (even only as needed), please bring them with you on the day of your procedure.  DO NOT TAKE 7 DAYS PRIOR TO TEST- Trulicity (dulaglutide) Ozempic, Wegovy (semaglutide) Mounjaro (tirzepatide) Bydureon Bcise (exanatide extended release)  DO NOT TAKE 1 DAY PRIOR TO YOUR TEST Rybelsus (semaglutide) Adlyxin (lixisenatide) Victoza (liraglutide) Byetta (exanatide) ________________________________________________________________________  _______________________________________________________  If your blood pressure at your visit was 140/90 or greater, please contact your primary care physician to follow up on this.  _______________________________________________________  If you are age 60 or older, your body mass index should be between 23-30. Your Body mass index is 30.18 kg/m. If this is out of the aforementioned range listed, please consider follow up with your Primary Care Provider.  If you are age 25 or younger, your body mass index should be between 19-25. Your Body mass index is 30.18 kg/m. If this is out of the aformentioned range listed, please consider follow up with your Primary Care Provider.   ________________________________________________________  The [facility] providers would like to encourage you to use MYCHART to communicate with providers for non-urgent requests or questions.  Due to long hold times on the telephone, sending your provider a message by [facility] may be a faster and more efficient way to get a response.  Please allow 48 business hours for a response.  Please remember that this is for non-urgent requests.  _______________________________________________________  [facility] is using a team-based approach to care.  Your team is made up of your doctor and two to three APPS. Our APPS (Nurse Practitioners and  Physician Assistants) work with your physician to ensure care continuity for you. They are fully qualified to address your health concerns and develop a treatment plan. They communicate directly with your gastroenterologist to care for you. Seeing the Advanced Practice Practitioners on your physician's team can help you by facilitating care more promptly, often allowing for earlier appointments, access to diagnostic testing, procedures, and other specialty referrals.

## 2023-10-22 NOTE — Progress Notes (Signed)
 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     11/05/2018 colonoscopy for screening of mild diverticulosis, sigmoid descending colon and nonbleeding internal hemorrhoids otherwise normal-repeat recommended 10 years    08/14/2022 Giardia, celiac and thyroid  unremarkable, fecal elastase 154 and started on Creon  36,002 pills with meals    08/19/2023 patient seen in clinic by Alan Coombs, PA-C.  At that time discussed that Creon  had helped some, continued to have diarrhea primarily in the morning after eating and worse after greasy foods.  Loose 4 stools a day.  No abdominal pain or weight loss.  That time recommended a fecal fat and CT abdomen pelvis.  Recommended stopping alcohol use.  Discussed Xifaxan  for IBS/SIBO.  Recommended fiber.  Repeat colon if needed.    08/19/2023 fecal fat normal.    09/01/2023 CTAP with contrast showed no acute findings, small benign left adrenal adenoma.  Small uterine fibroids.    Today, the patient tells me that she continues with loose stools.  Describes that she took the Xifaxan  and it made no change in her bowel habits at all.  She has noticed certain foods certainly make it worse including lettuce of any kind, salsa and pizza.  Typically this loose stool wakes her up from her sleep in the morning around 2:30 and then she can have 4-5 loose or oatmeal like consistency stools throughout the day.  No abdominal cramping.  Does tell me that with the Creon , 2 with a meal and 1 with a snack, it stops her from having diarrhea with everything that she eats, but it is still life inhibiting for her.    Denies fever, chills or weight loss.  Past Medical History:  Diagnosis Date   Anemia    Arthritis    Complication of anesthesia    Herpes genitalis in women 2010    Lactose intolerance    MRSA infection greater than 3 months ago 2009   PONV (postoperative nausea and vomiting)    Sarcoid 1993   in remission per pt   SOBOE (shortness of breath on exertion)     Past Surgical History:  Procedure Laterality Date   CALCANEAL OSTEOTOMY Left 07/30/2019   Procedure: CALCANEAL OSTEOTOMY;  Surgeon: Kit Rush, MD;  Location: [facility];  Service: Orthopedics;  Laterality: Left;   GASTROC RECESSION EXTREMITY Left 07/30/2019   Procedure: LEFT GASTROC RECESSION;  Surgeon: Kit Rush, MD;  Location: [facility];  Service: Orthopedics;  Laterality: Left;   LYMPH NODE BIOPSY  1993   POSTERIOR TIBIAL TENDON REPAIR Left 07/30/2019   Procedure: EXCISION  LEFT ACCESSSORY NAVICULAR, POSTERIOR TIBIAL TENDON RECONSTRUCTION;  Surgeon: Kit Rush, MD;  Location: [facility];  Service: Orthopedics;  Laterality: Left;   TUBAL LIGATION  1988    Current Outpatient Medications  Medication Sig Dispense Refill   estradiol  (ESTRACE ) 1 MG tablet Take 1 tablet (1 mg total) by mouth daily. 90 tablet 3   fluticasone  (FLONASE ) 50 MCG/ACT nasal spray Place 2 sprays into both nostrils daily. 16 g 6   lipase/protease/amylase (CREON ) 36000 UNITS CPEP capsule Take 2 capsules (72,000 Units total) by mouth 3 (three) times daily with meals. May also take 1 capsule (36,000 Units total) 2 (two) times daily with snacks as needed. 240 capsule 2  Multiple Vitamins-Minerals (MULTIVITAMIN WITH MINERALS) tablet Take by mouth.     nystatin  (MYCOSTATIN /NYSTOP ) powder Apply topically to affected area 2 (two) times daily. (Patient not taking: Reported on 08/19/2023) 60 g 3   Omega-3 Fatty Acids (FISH OIL PO) Take by mouth. (Patient not taking: Reported on 08/19/2023)     progesterone  (PROMETRIUM ) 100 MG capsule Take 1 capsule (100 mg total) by mouth daily. 90 capsule 3   sodium fluoride  (PREVIDENT 5000 PLUS) 1.1 % CREA dental cream Brush on teeth for 1 minute  then expectorate (Patient not taking: Reported on 08/19/2023) 51 g 1   traZODone  (DESYREL ) 50 MG tablet Take 1-3 tablets (50-150 mg total) by mouth at bedtime as needed for sleep. (Patient not taking: Reported on 08/19/2023) 30 tablet 3   triamcinolone  cream (KENALOG ) 0.1 % Apply a small amount to affected area once a day as needed (Patient not taking: Reported on 08/19/2023) 80 g 3   valACYclovir  (VALTREX ) 500 MG tablet Take 1 tablet (500 mg total) by mouth daily. 90 tablet 3   valACYclovir  (VALTREX ) 500 MG tablet Take 1 tablet (500 mg total) by mouth once daily. 90 tablet 3   No current facility-administered medications for this visit.    Allergies as of 10/22/2023 - Review Complete 08/19/2023  Allergen Reaction Noted   Latex Hives and Itching 09/05/2018   Amoxicillin Hives    Codeine  Nausea And Vomiting 05/14/2013   Penicillins Hives     Family History  Problem Relation Age of Onset   Other Mother    Cancer Mother    Hypertension Father    Stroke Father    Alcoholism Father    Colon cancer Neg Hx    Colon polyps Neg Hx    Esophageal cancer Neg Hx    Rectal cancer Neg Hx    Stomach cancer Neg Hx     Social History   Socioeconomic History   Marital status: Single    Spouse name: Not on file   Number of children: 2   Years of education: Not on file   Highest education level: GED or equivalent  Occupational History   Occupation: Psychologist, sport and exercise  Tobacco Use   Smoking status: Some Days    Current packs/day: 0.50    Average packs/day: 0.5 packs/day for 39.3 years (19.7 ttl pk-yrs)    Types: Cigarettes    Start date: 07/02/1984   Smokeless tobacco: Never   Tobacco comments:    10 cig/day  Vaping Use   Vaping status: Every Day   Substances: Nicotine, Flavoring  Substance and Sexual Activity   Alcohol use: Yes    Alcohol/week: 6.0 standard drinks of alcohol    Types: 6 Cans of beer per week    Comment: 1-2 beer    Drug use: No   Sexual activity: Yes    Birth  control/protection: None, Surgical  Other Topics Concern   Not on file  Social History Narrative   Not on file   Social Drivers of Health   Financial Resource Strain: Not on file  Food Insecurity: Not on file  Transportation Needs: Not on file  Physical Activity: Not on file  Stress: Not on file  Social Connections: Not on file  Intimate Partner Violence: Not on file    Review of Systems:    Constitutional: No weight loss, fever or chills Cardiovascular: No chest pain Respiratory: No SOB Gastrointestinal: See HPI and otherwise negative   Physical Exam:  Vital signs: BP 110/80   Pulse  82   Ht 5' 2 (1.575 m)   Wt 165 lb (74.8 kg)   LMP  (LMP Unknown)   BMI 30.18 kg/m    Constitutional:   Pleasant AA female appears to be in NAD, Well developed, Well nourished, alert and cooperative Respiratory: Respirations even and unlabored. Lungs clear to auscultation bilaterally.   No wheezes, crackles, or rhonchi.  Cardiovascular: Normal S1, S2. No MRG. Regular rate and rhythm. No peripheral edema, cyanosis or pallor.  Gastrointestinal:  Soft, nondistended, nontender. No rebound or guarding. Normal bowel sounds. No appreciable masses or hepatomegaly. Rectal:  Not performed.  Psychiatric: Demonstrates good judgement and reason without abnormal affect or behaviors.  See HPI for most recent labs/imaging.  Assessment: 1.  Chronic diarrhea: 11/05/2018 colonoscopy for screening with mild diverticulosis and nonbleeding internal hemorrhoids, initial evaluation in June 2024 with Giardia, celiac and thyroid  unremarkable, pancreatic elastase decreased at 154 and started on Creon  36,000 units 2 pills with meals and 1 with a snack which decreased urgency after meals, but still waking up from her sleep with loose stools, CTAP unremarkable in June, fecal fat normal; consider microscopic colitis +/- IBS-D (atypical though with no abdominal cramping) 2.  Pulmonary sarcoidosis: Currently in remission per  the patient, continues to smoke  Plan: 1.  Scheduled patient for a diagnostic colonoscopy given chronic diarrhea with Dr. Shila in [facility].  Did provide the patient with a detailed list of risks for the procedure and she agrees to proceed. 2.  For now continue Creon  3.  Reviewed recent CT which was normal with the patient 4.  Pending results from colonoscopy could consider an EGD as well. 5.  Patient to follow clinic per recommendations of Dr. Shila after time of procedure.  Kathleen Failing, PA-C [facility] 10/22/2023, 10:07 AM  Cc: [place], Betty G, MD

## 2023-10-25 ENCOUNTER — Other Ambulatory Visit (HOSPITAL_COMMUNITY): Payer: Self-pay

## 2023-10-28 ENCOUNTER — Other Ambulatory Visit (HOSPITAL_COMMUNITY): Payer: Self-pay

## 2023-10-28 ENCOUNTER — Ambulatory Visit: Admitting: Family Medicine

## 2023-10-28 ENCOUNTER — Encounter: Payer: Self-pay | Admitting: Family Medicine

## 2023-10-28 VITALS — BP 120/80 | HR 91 | Resp 16 | Ht 62.0 in | Wt 167.5 lb

## 2023-10-28 DIAGNOSIS — Z13228 Encounter for screening for other metabolic disorders: Secondary | ICD-10-CM | POA: Diagnosis not present

## 2023-10-28 DIAGNOSIS — Z1329 Encounter for screening for other suspected endocrine disorder: Secondary | ICD-10-CM

## 2023-10-28 DIAGNOSIS — Z130 Encounter for screening for diseases of the blood and blood-forming organs and certain disorders involving the immune mechanism: Secondary | ICD-10-CM | POA: Diagnosis not present

## 2023-10-28 DIAGNOSIS — Z0000 Encounter for general adult medical examination without abnormal findings: Secondary | ICD-10-CM

## 2023-10-28 DIAGNOSIS — G4700 Insomnia, unspecified: Secondary | ICD-10-CM

## 2023-10-28 DIAGNOSIS — E785 Hyperlipidemia, unspecified: Secondary | ICD-10-CM

## 2023-10-28 DIAGNOSIS — Z23 Encounter for immunization: Secondary | ICD-10-CM | POA: Diagnosis not present

## 2023-10-28 MED ORDER — DOXEPIN HCL 10 MG/ML PO CONC
3.0000 mg | Freq: Every day | ORAL | 1 refills | Status: AC
  Filled 2023-10-28: qty 30, 30d supply, fill #0

## 2023-10-28 NOTE — Assessment & Plan Note (Signed)
 Continue nonpharmacologic treatment. Further recommendation will be given according to lipid panel result.

## 2023-10-28 NOTE — Assessment & Plan Note (Signed)
 She tried trazodone , discontinued because of nightmares. She agrees with trying doxepin , starting with 3 mg and titrating up to 10 mg at bedtime. Doxepin  side effects discussed, it may help with chronic diarrhea. Continue good sleep hygiene. Instructed to let me know if medication is helping in about 2 months, before if she has any side effect.

## 2023-10-28 NOTE — Progress Notes (Unsigned)
 HPI: Kathleen Vang is a 53 y.o. female with a PMHx significant for tobacco use disorder, chronic headaches, HLD, and eczema, who is here today for her routine physical.  Last CPE: 10/03/2022 Since her last visit, she has followed up with GI for diarrhea.   Exercise: walking at work and outside  Diet: Eating out a lot more due to a death in her family.  Sleep: 7 hours/night.   Smoking: Working on smoking cessation, has cut back.  Alcohol consumption: Beer 2x/week  Dental: UTD with routine dental care.  Vision: UTD with routine vision exams  Established with Dr. Gretta of ob/gynecology.  Pap in 2019 had abnormalities. Repeat pap done on 10/24/2020 was normal.    Immunization History  Administered Date(s) Administered   Influenza,inj,Quad PF,6+ Mos 11/10/2021   Influenza-Unspecified 12/30/2020   Moderna Sars-Covid-2 Vaccination 09/26/2019, 10/17/2019   PFIZER(Purple Top)SARS-COV-2 Vaccination 10/17/2019   PNEUMOCOCCAL CONJUGATE-20 10/28/2023   Pfizer Covid-19 Vaccine Bivalent Booster 20yrs & up 04/29/2021   Pneumococcal Polysaccharide-23 09/25/2021   Td 03/05/2006   Tdap 07/27/2020   Zoster Recombinant(Shingrix ) 12/30/2020, 03/13/2021   Health Maintenance  Topic Date Due   INFLUENZA VACCINE  11/07/2023 (Originally 10/04/2023)   COVID-19 Vaccine (5 - 2024-25 season) 11/13/2023 (Originally 11/04/2022)   Cervical Cancer Screening (HPV/Pap Cotest)  10/27/2024 (Originally 10/25/2023)   Hepatitis B Vaccines 19-59 Average Risk (1 of 3 - 19+ 3-dose series) 10/27/2024 (Originally 07/03/1987)   MAMMOGRAM  12/24/2023   Colonoscopy  11/04/2028   DTaP/Tdap/Td (3 - Td or Tdap) 07/28/2030   Pneumococcal Vaccine: 50+ Years  Completed   Hepatitis C Screening  Completed   HIV Screening  Completed   Zoster Vaccines- Shingrix   Completed   HPV VACCINES  Aged Out   Meningococcal B Vaccine  Aged Out   Chronic medical problems:   Insomnia:  No longer taking Trazodone  as a sleeping aid.  Medication caused nightmares, so she discontinued. She does not have problems falling asleep, wakes up earlier and frequently she is unable to go back to sleep.  Diarrhea  She was seen by GI in 08/2023 for persistent diarrhea. Problem has improved with Creaon but still having episodes. Has had episodes of incontinence.  She is able to sleep at 9 PM, but tends to wake up around around 3 AM d/t diarrhea.  This has disrupted her sleep quality, given she has to wake up at 5 AM for work.   Review of Systems  Constitutional:  Negative for activity change, appetite change, chills and fever.  HENT:  Negative for mouth sores, sore throat and trouble swallowing.   Eyes:  Negative for redness and visual disturbance.  Respiratory:  Positive for cough (Occasionally, unchanged.). Negative for shortness of breath and wheezing.   Cardiovascular:  Negative for chest pain and leg swelling.  Gastrointestinal:  Negative for abdominal pain, nausea and vomiting.  Endocrine: Negative for cold intolerance, heat intolerance, polydipsia, polyphagia and polyuria.  Genitourinary:  Negative for decreased urine volume, dysuria and hematuria.  Musculoskeletal:  Negative for gait problem and myalgias.  Skin:  Negative for color change and rash.  Allergic/Immunologic: Positive for environmental allergies.  Neurological:  Negative for syncope, weakness and headaches.  Hematological:  Negative for adenopathy. Does not bruise/bleed easily.  Psychiatric/Behavioral:  Positive for sleep disturbance. Negative for confusion and hallucinations.   All other systems reviewed and are negative.  Current Outpatient Medications on File Prior to Visit  Medication Sig Dispense Refill   estradiol  (ESTRACE ) 1 MG tablet Take 1  tablet (1 mg total) by mouth daily. 90 tablet 3   fluticasone  (FLONASE ) 50 MCG/ACT nasal spray Place 2 sprays into both nostrils daily. 16 g 6   lipase/protease/amylase (CREON ) 36000 UNITS CPEP capsule Take 2  capsules (72,000 Units total) by mouth 3 (three) times daily with meals. May also take 1 capsule (36,000 Units total) 2 (two) times daily with snacks as needed. 240 capsule 2   Multiple Vitamins-Minerals (MULTIVITAMIN WITH MINERALS) tablet Take by mouth.     nystatin  (MYCOSTATIN /NYSTOP ) powder Apply topically to affected area 2 (two) times daily. (Patient taking differently: Apply 1 Application topically 2 (two) times daily as needed.) 60 g 3   progesterone  (PROMETRIUM ) 100 MG capsule Take 1 capsule (100 mg total) by mouth daily. 90 capsule 3   sodium fluoride  (PREVIDENT 5000 PLUS) 1.1 % CREA dental cream Brush on teeth for 1 minute then expectorate (Patient taking differently: Place 1 Application onto teeth as needed.) 51 g 1   triamcinolone  cream (KENALOG ) 0.1 % Apply a small amount to affected area once a day as needed (Patient taking differently: Apply 1 Application topically as needed.) 80 g 3   valACYclovir  (VALTREX ) 500 MG tablet Take 1 tablet (500 mg total) by mouth once daily. 90 tablet 3   [DISCONTINUED] PARoxetine  (PAXIL ) 10 MG tablet Take 1 tablet (10 mg total) by mouth daily. 30 tablet 1   [DISCONTINUED] venlafaxine  XR (EFFEXOR  XR) 37.5 MG 24 hr capsule Take 1 capsule (37.5 mg total) by mouth daily with breakfast. 30 capsule 1   No current facility-administered medications on file prior to visit.   Past Medical History:  Diagnosis Date   Anemia    Arthritis    Complication of anesthesia    Herpes genitalis in women 2010   Lactose intolerance    MRSA infection greater than 3 months ago 2009   PONV (postoperative nausea and vomiting)    Sarcoid 1993   in remission per pt   SOBOE (shortness of breath on exertion)    Past Surgical History:  Procedure Laterality Date   CALCANEAL OSTEOTOMY Left 07/30/2019   Procedure: CALCANEAL OSTEOTOMY;  Surgeon: Kit Rush, MD;  Location: [facility];  Service: Orthopedics;  Laterality: Left;   GASTROC RECESSION EXTREMITY Left  07/30/2019   Procedure: LEFT GASTROC RECESSION;  Surgeon: Kit Rush, MD;  Location: [facility];  Service: Orthopedics;  Laterality: Left;   LYMPH NODE BIOPSY  1993   POSTERIOR TIBIAL TENDON REPAIR Left 07/30/2019   Procedure: EXCISION  LEFT ACCESSSORY NAVICULAR, POSTERIOR TIBIAL TENDON RECONSTRUCTION;  Surgeon: Kit Rush, MD;  Location: [facility];  Service: Orthopedics;  Laterality: Left;   TUBAL LIGATION  1988    Allergies  Allergen Reactions   Latex Hives and Itching   Amoxicillin Hives   Codeine  Nausea And Vomiting   Penicillins Hives    REACTION: hives    Family History  Problem Relation Age of Onset   Other Mother    Cancer Mother    Hypertension Father    Stroke Father    Alcoholism Father    Colon cancer Neg Hx    Colon polyps Neg Hx    Esophageal cancer Neg Hx    Rectal cancer Neg Hx    Stomach cancer Neg Hx     Social History   Socioeconomic History   Marital status: Single    Spouse name: Not on file   Number of children: 2   Years of education: Not on  file   Highest education level: GED or equivalent  Occupational History   Occupation: Psychologist, sport and exercise  Tobacco Use   Smoking status: Some Days    Current packs/day: 0.50    Average packs/day: 0.5 packs/day for 39.3 years (19.7 ttl pk-yrs)    Types: Cigarettes    Start date: 07/02/1984   Smokeless tobacco: Never   Tobacco comments:    10 cig/day  Vaping Use   Vaping status: Every Day   Substances: Nicotine, Flavoring  Substance and Sexual Activity   Alcohol use: Yes    Alcohol/week: 6.0 standard drinks of alcohol    Types: 6 Cans of beer per week    Comment: 1-2 beer    Drug use: No   Sexual activity: Yes    Birth control/protection: None, Surgical  Other Topics Concern   Not on file  Social History Narrative   Not on file   Social Drivers of Health   Financial Resource Strain: Not on file  Food Insecurity: Not on file  Transportation Needs: Not on file   Physical Activity: Not on file  Stress: Not on file  Social Connections: Not on file   Vitals:   10/28/23 1506  BP: 120/80  Pulse: 91  Resp: 16  SpO2: 98%   Body mass index is 30.64 kg/m.  Wt Readings from Last 3 Encounters:  10/28/23 167 lb 8 oz (76 kg)  10/22/23 165 lb (74.8 kg)  08/19/23 168 lb (76.2 kg)   Physical Exam Vitals and nursing note reviewed.  Constitutional:      General: She is not in acute distress.    Appearance: She is well-developed.  HENT:     Head: Normocephalic and atraumatic.     Right Ear: External ear normal. Tympanic membrane is not erythematous.     Left Ear: Tympanic membrane, ear canal and external ear normal.     Ears:     Comments: Cerumen excess in right ear canal, TM seen partially.    Mouth/Throat:     Mouth: Mucous membranes are moist.     Pharynx: Oropharynx is clear. Uvula midline.  Eyes:     Extraocular Movements: Extraocular movements intact.     Conjunctiva/sclera: Conjunctivae normal.     Pupils: Pupils are equal, round, and reactive to light.  Neck:     Thyroid : No thyroid  mass or thyromegaly.  Cardiovascular:     Rate and Rhythm: Normal rate and regular rhythm.     Pulses:          Dorsalis pedis pulses are 2+ on the right side and 2+ on the left side.     Heart sounds: No murmur heard. Pulmonary:     Effort: Pulmonary effort is normal. No respiratory distress.     Breath sounds: Normal breath sounds.  Abdominal:     Palpations: Abdomen is soft. There is no hepatomegaly or mass.     Tenderness: There is no abdominal tenderness.  Genitourinary:    Comments: Deferred to gyn. Musculoskeletal:     Comments: No major deformity or signs of synovitis appreciated.  Lymphadenopathy:     Cervical: No cervical adenopathy.     Upper Body:     Right upper body: No supraclavicular adenopathy.     Left upper body: No supraclavicular adenopathy.  Skin:    General: Skin is warm.     Findings: No erythema or rash.   Neurological:     General: No focal deficit present.     Mental  Status: She is alert and oriented to person, place, and time.     Cranial Nerves: No cranial nerve deficit.     Coordination: Coordination normal.     Gait: Gait normal.     Deep Tendon Reflexes:     Reflex Scores:      Bicep reflexes are 2+ on the right side and 2+ on the left side.      Patellar reflexes are 2+ on the right side and 2+ on the left side. Psychiatric:        Mood and Affect: Mood and affect normal.    ASSESSMENT AND PLAN: Kathleen Vang was here today annual physical examination.  Orders Placed This Encounter  Procedures   Pneumococcal conjugate vaccine 20-valent (Prevnar 20)   Comprehensive metabolic panel with GFR   Lipid panel   Lab Results  Component Value Date   NA 141 10/28/2023   CL 103 10/28/2023   K 3.7 10/28/2023   CO2 27 10/28/2023   BUN 9 10/28/2023   CREATININE 0.74 10/28/2023   GFR 91.14 10/28/2023   CALCIUM 9.3 10/28/2023   ALBUMIN 4.2 10/28/2023   GLUCOSE 82 10/28/2023   Lab Results  Component Value Date   ALT 12 10/28/2023   AST 22 10/28/2023   ALKPHOS 44 10/28/2023   BILITOT 0.3 10/28/2023   Lab Results  Component Value Date   CHOL 177 10/28/2023   HDL 53.00 10/28/2023   LDLCALC 95 10/28/2023   LDLDIRECT 79.0 10/03/2022   TRIG 142.0 10/28/2023   CHOLHDL 3 10/28/2023   Routine general medical examination at a health care facility Assessment & Plan: We discussed the importance of regular physical activity and healthy diet for prevention of chronic illness and/or complications. Preventive guidelines reviewed. Vaccination updated. Encouraged smoking cessation. Continue female preventive care with her gynecologist. Next CPE in a year.   Hyperlipidemia, unspecified hyperlipidemia type Assessment & Plan: Continue nonpharmacologic treatment. Further recommendation will be given according to lipid panel result.  Orders: -     Comprehensive metabolic panel  with GFR; Future -     Lipid panel; Future  Screening for endocrine, metabolic and immunity disorder -     Comprehensive metabolic panel with GFR; Future  Insomnia, unspecified type Assessment & Plan: She tried trazodone , discontinued because of nightmares. She agrees with trying doxepin , starting with 3 mg and titrating up to 10 mg at bedtime. Doxepin  side effects discussed, it may help with chronic diarrhea. Continue good sleep hygiene. Instructed to let me know if medication is helping in about 2 months, before if she has any side effect.  Orders: -     Doxepin  HCl; Take 0.3-1 mLs (3-10 mg total) by mouth at bedtime.  Dispense: 30 mL; Refill: 1  Need for pneumococcal vaccination -     Pneumococcal conjugate vaccine 20-valent   Return in 1 year (on 10/27/2024) for CPE.  I, Vernell Forest, acting as a scribe for Neela Zecca [place], MD., have documented all relevant documentation on the behalf of Kathleen Chokshi [place], MD, as directed by   while in the presence of Keola Heninger [place], MD.  I, Kathleen Crittendon [place], MD, have reviewed all documentation for this visit. The documentation on 10/28/23 for the exam, diagnosis, procedures, and orders are all accurate and complete.  Erroll Wilbourne G. [place], MD  [facility]

## 2023-10-28 NOTE — Assessment & Plan Note (Signed)
 We discussed the importance of regular physical activity and healthy diet for prevention of chronic illness and/or complications. Preventive guidelines reviewed. Vaccination updated. Encouraged smoking cessation. Continue female preventive care with her gynecologist. Next CPE in a year.

## 2023-10-28 NOTE — Patient Instructions (Addendum)
 A few things to remember from today's visit:  Routine general medical examination at a health care facility  Hyperlipidemia, unspecified hyperlipidemia type - Plan: Comprehensive metabolic panel with GFR, Lipid panel  Screening for endocrine, metabolic and immunity disorder  Insomnia, unspecified type - Plan: doxepin  (SINEQUAN ) 10 MG/ML solution  Try Doxepin  for sleep, 3-10 mg at bedtime. Let me know if it is helping in about 2 months.  If you need refills for medications you take chronically, please call your pharmacy. Do not use My Chart to request refills or for acute issues that need immediate attention. If you send a my chart message, it may take a few days to be addressed, specially if I am not in the office.  Please be sure medication list is accurate. If a new problem present, please set up appointment sooner than planned today.  Health Maintenance, Female Adopting a healthy lifestyle and getting preventive care are important in promoting health and wellness. Ask your health care provider about: The right schedule for you to have regular tests and exams. Things you can do on your own to prevent diseases and keep yourself healthy. What should I know about diet, weight, and exercise? Eat a healthy diet  Eat a diet that includes plenty of vegetables, fruits, low-fat dairy products, and lean protein. Do not eat a lot of foods that are high in solid fats, added sugars, or sodium. Maintain a healthy weight Body mass index (BMI) is used to identify weight problems. It estimates body fat based on height and weight. Your health care provider can help determine your BMI and help you achieve or maintain a healthy weight. Get regular exercise Get regular exercise. This is one of the most important things you can do for your health. Most adults should: Exercise for at least 150 minutes each week. The exercise should increase your heart rate and make you sweat (moderate-intensity  exercise). Do strengthening exercises at least twice a week. This is in addition to the moderate-intensity exercise. Spend less time sitting. Even light physical activity can be beneficial. Watch cholesterol and blood lipids Have your blood tested for lipids and cholesterol at 55 years of age, then have this test every 5 years. Have your cholesterol levels checked more often if: Your lipid or cholesterol levels are high. You are older than 55 years of age. You are at high risk for heart disease. What should I know about cancer screening? Depending on your health history and family history, you may need to have cancer screening at various ages. This may include screening for: Breast cancer. Cervical cancer. Colorectal cancer. Skin cancer. Lung cancer. What should I know about heart disease, diabetes, and high blood pressure? Blood pressure and heart disease High blood pressure causes heart disease and increases the risk of stroke. This is more likely to develop in people who have high blood pressure readings or are overweight. Have your blood pressure checked: Every 3-5 years if you are 27-4 years of age. Every year if you are 56 years old or older. Diabetes Have regular diabetes screenings. This checks your fasting blood sugar level. Have the screening done: Once every three years after age 17 if you are at a normal weight and have a low risk for diabetes. More often and at a younger age if you are overweight or have a high risk for diabetes. What should I know about preventing infection? Hepatitis B If you have a higher risk for hepatitis B, you should be screened for  this virus. Talk with your health care provider to find out if you are at risk for hepatitis B infection. Hepatitis C Testing is recommended for: Everyone born from [birth year] through 1967. Anyone with known risk factors for hepatitis C. Sexually transmitted infections (STIs) Get screened for STIs, including gonorrhea and  chlamydia, if: You are sexually active and are younger than 55 years of age. You are older than 55 years of age and your health care provider tells you that you are at risk for this type of infection. Your sexual activity has changed since you were last screened, and you are at increased risk for chlamydia or gonorrhea. Ask your health care provider if you are at risk. Ask your health care provider about whether you are at high risk for HIV. Your health care provider may recommend a prescription medicine to help prevent HIV infection. If you choose to take medicine to prevent HIV, you should first get tested for HIV. You should then be tested every 3 months for as long as you are taking the medicine. Pregnancy If you are about to stop having your period (premenopausal) and you may become pregnant, seek counseling before you get pregnant. Take 400 to 800 micrograms (mcg) of folic acid every day if you become pregnant. Ask for birth control (contraception) if you want to prevent pregnancy. Osteoporosis and menopause Osteoporosis is a disease in which the bones lose minerals and strength with aging. This can result in bone fractures. If you are 30 years old or older, or if you are at risk for osteoporosis and fractures, ask your health care provider if you should: Be screened for bone loss. Take a calcium or vitamin D supplement to lower your risk of fractures. Be given hormone replacement therapy (HRT) to treat symptoms of menopause. Follow these instructions at home: Alcohol use Do not drink alcohol if: Your health care provider tells you not to drink. You are pregnant, may be pregnant, or are planning to become pregnant. If you drink alcohol: Limit how much you have to: 0-1 drink a day. Know how much alcohol is in your drink. In the [place] one drink equals one 12 oz bottle of beer (355 mL), one 5 oz glass of wine (148 mL), or one 1 oz glass of hard liquor (44 mL). Lifestyle Do not use any  products that contain nicotine or tobacco. These products include cigarettes, chewing tobacco, and vaping devices, such as e-cigarettes. If you need help quitting, ask your health care provider. Do not use street drugs. Do not share needles. Ask your health care provider for help if you need support or information about quitting drugs. General instructions Schedule regular health, dental, and eye exams. Stay current with your vaccines. Tell your health care provider if: You often feel depressed. You have ever been abused or do not feel safe at home. Summary Adopting a healthy lifestyle and getting preventive care are important in promoting health and wellness. Follow your health care provider's instructions about healthy diet, exercising, and getting tested or screened for diseases. Follow your health care provider's instructions on monitoring your cholesterol and blood pressure. This information is not intended to replace advice given to you by your health care provider. Make sure you discuss any questions you have with your health care provider. Document Revised: 07/11/2020 Document Reviewed: 07/11/2020 Elsevier Patient Education  2024 [employer].

## 2023-10-29 ENCOUNTER — Other Ambulatory Visit (HOSPITAL_COMMUNITY): Payer: Self-pay

## 2023-10-29 LAB — LIPID PANEL
Cholesterol: 177 mg/dL (ref 0–200)
HDL: 53 mg/dL (ref >39.00)
LDL Cholesterol: 95 mg/dL (ref 0–99)
NonHDL: 123.63
Total CHOL/HDL Ratio: 3
Triglycerides: 142 mg/dL (ref 0.0–149.0)
VLDL: 28.4 mg/dL (ref 0.0–40.0)

## 2023-10-29 LAB — COMPREHENSIVE METABOLIC PANEL WITH GFR
ALT: 12 U/L (ref 0–35)
AST: 22 U/L (ref 0–37)
Albumin: 4.2 g/dL (ref 3.5–5.2)
Alkaline Phosphatase: 44 U/L (ref 39–117)
BUN: 9 mg/dL (ref 6–23)
CO2: 27 meq/L (ref 19–32)
Calcium: 9.3 mg/dL (ref 8.4–10.5)
Chloride: 103 meq/L (ref 96–112)
Creatinine, Ser: 0.74 mg/dL (ref 0.40–1.20)
GFR: 91.14 mL/min (ref >60.00)
Glucose, Bld: 82 mg/dL (ref 70–99)
Potassium: 3.7 meq/L (ref 3.5–5.1)
Sodium: 141 meq/L (ref 135–145)
Total Bilirubin: 0.3 mg/dL (ref 0.2–1.2)
Total Protein: 7.5 g/dL (ref 6.0–8.3)

## 2023-10-31 ENCOUNTER — Ambulatory Visit: Payer: Self-pay | Admitting: Family Medicine

## 2023-11-04 ENCOUNTER — Other Ambulatory Visit: Payer: Self-pay

## 2023-11-05 ENCOUNTER — Other Ambulatory Visit (HOSPITAL_COMMUNITY): Payer: Self-pay

## 2023-11-05 ENCOUNTER — Other Ambulatory Visit: Payer: Self-pay

## 2023-11-14 ENCOUNTER — Encounter: Payer: Self-pay | Admitting: Gastroenterology

## 2023-11-21 ENCOUNTER — Encounter: Payer: Self-pay | Admitting: Gastroenterology

## 2023-11-21 ENCOUNTER — Ambulatory Visit: Admitting: Gastroenterology

## 2023-11-21 VITALS — BP 135/83 | HR 75 | Temp 98.1°F | Resp 12 | Ht 62.0 in | Wt 167.0 lb

## 2023-11-21 DIAGNOSIS — K52832 Lymphocytic colitis: Secondary | ICD-10-CM

## 2023-11-21 DIAGNOSIS — K648 Other hemorrhoids: Secondary | ICD-10-CM | POA: Diagnosis not present

## 2023-11-21 DIAGNOSIS — K5730 Diverticulosis of large intestine without perforation or abscess without bleeding: Secondary | ICD-10-CM

## 2023-11-21 DIAGNOSIS — K529 Noninfective gastroenteritis and colitis, unspecified: Secondary | ICD-10-CM

## 2023-11-21 DIAGNOSIS — K644 Residual hemorrhoidal skin tags: Secondary | ICD-10-CM

## 2023-11-21 MED ORDER — SODIUM CHLORIDE 0.9 % IV SOLN: 500.0000 mL | Freq: Once | INTRAVENOUS | Status: DC

## 2023-11-21 NOTE — Op Note (Signed)
 [facility] Patient Name: Kathleen Vang Procedure Date: 11/21/2023 3:31 PM MRN: [id number] Endoscopist: Gustav ALONSO Mcgee , MD, [id number] Age: 53 Referring MD:  Date of Birth: April 27, 1970 Gender: Female Account #: [id number] Procedure:                Colonoscopy Indications:              Clinically significant diarrhea of unexplained                            origin Medicines:                Monitored Anesthesia Care Procedure:                Pre-Anesthesia Assessment:                           - Prior to the procedure, a History and Physical                            was performed, and patient medications and                            allergies were reviewed. The patient's tolerance of                            previous anesthesia was also reviewed. The risks                            and benefits of the procedure and the sedation                            options and risks were discussed with the patient.                            All questions were answered, and informed consent                            was obtained. Prior Anticoagulants: The patient has                            taken no anticoagulant or antiplatelet agents. ASA                            Grade Assessment: II - A patient with mild systemic                            disease. After reviewing the risks and benefits,                            the patient was deemed in satisfactory condition to                            undergo the procedure.  After obtaining informed consent, the colonoscope                            was passed under direct vision. Throughout the                            procedure, the patient's blood pressure, pulse, and                            oxygen saturations were monitored continuously. The                            Olympus Scope SN: [id number] was introduced through                            the anus and advanced to the the cecum,  identified                            by appendiceal orifice and ileocecal valve. The                            colonoscopy was performed without difficulty. The                            patient tolerated the procedure well. The quality                            of the bowel preparation was good. The ileocecal                            valve, appendiceal orifice, and rectum were                            photographed. Scope In: 4:02:13 PM Scope Out: 4:13:38 PM Scope Withdrawal Time: 0 hours 6 minutes 42 seconds  Total Procedure Duration: 0 hours 11 minutes 25 seconds  Findings:                 The perianal and digital rectal examinations were                            normal.                           Normal mucosa was found in the entire colon.                            Biopsies for histology were taken with a cold                            forceps from the right colon and left colon for                            evaluation of microscopic colitis.  Scattered small-mouthed diverticula were found in                            the sigmoid colon.                           Non-bleeding external and internal hemorrhoids were                            found during retroflexion. The hemorrhoids were                            small. Complications:            No immediate complications. Estimated Blood Loss:     Estimated blood loss was minimal. Impression:               - Normal mucosa in the entire examined colon.                            Biopsied.                           - Diverticulosis in the sigmoid colon.                           - Non-bleeding external and internal hemorrhoids. Recommendation:           - Patient has a contact number available for                            emergencies. The signs and symptoms of potential                            delayed complications were discussed with the                            patient. Return to normal  activities tomorrow.                            Written discharge instructions were provided to the                            patient.                           - Resume previous diet.                           - Continue present medications.                           - Await pathology results.                           - Repeat colonoscopy in 10 years for surveillance  based on pathology results. Kathleen Hatfield V. Kailah Pennel, MD 11/21/2023 4:18:35 PM This report has been signed electronically.

## 2023-11-21 NOTE — Patient Instructions (Signed)
 YOU HAD AN ENDOSCOPIC PROCEDURE TODAY AT THE [facility]:   Refer to the procedure report that was given to you for any specific questions about what was found during the examination.  If the procedure report does not answer your questions, please call your gastroenterologist to clarify.  If you requested that your care partner not be given the details of your procedure findings, then the procedure report has been included in a sealed envelope for you to review at your convenience later.  YOU SHOULD EXPECT: Some feelings of bloating in the abdomen. Passage of more gas than usual.  Walking can help get rid of the air that was put into your GI tract during the procedure and reduce the bloating. If you had a lower endoscopy (such as a colonoscopy or flexible sigmoidoscopy) you may notice spotting of blood in your stool or on the toilet paper. If you underwent a bowel prep for your procedure, you may not have a normal bowel movement for a few days.  Please Note:  You might notice some irritation and congestion in your nose or some drainage.  This is from the oxygen used during your procedure.  There is no need for concern and it should clear up in a day or so.  SYMPTOMS TO REPORT IMMEDIATELY:  Following lower endoscopy (colonoscopy or flexible sigmoidoscopy):  Excessive amounts of blood in the stool  Significant tenderness or worsening of abdominal pains  Swelling of the abdomen that is new, acute  Fever of 100F or higher  Resume previous diet Continue present medications Await pathology results  For urgent or emergent issues, a gastroenterologist can be reached at any hour by calling [phone]. Do not use MyChart messaging for urgent concerns.    DIET:  We do recommend a small meal at first, but then you may proceed to your regular diet.  Drink plenty of fluids but you should avoid alcoholic beverages for 24 hours.  ACTIVITY:  You should plan to take it easy for the rest of  today and you should NOT DRIVE or use heavy machinery until tomorrow (because of the sedation medicines used during the test).    FOLLOW UP: Our staff will call the number listed on your records the next business day following your procedure.  We will call around 7:15- 8:00 am to check on you and address any questions or concerns that you may have regarding the information given to you following your procedure. If we do not reach you, we will leave a message.     If any biopsies were taken you will be contacted by phone or by letter within the next 1-3 weeks.  Please call us  at [phone] if you have not heard about the biopsies in 3 weeks.    SIGNATURES/CONFIDENTIALITY: You and/or your care partner have signed paperwork which will be entered into your electronic medical record.  These signatures attest to the fact that that the information above on your After Visit Summary has been reviewed and is understood.  Full responsibility of the confidentiality of this discharge information lies with you and/or your care-partner.

## 2023-11-21 NOTE — Progress Notes (Signed)
 Vss nad trans to pacu

## 2023-11-21 NOTE — Progress Notes (Signed)
 [facility] History and Physical   Primary Care Physician:  [place], Betty G, MD   Reason for Procedure:  Chronic diarrhea  Plan:     colonoscopy with possible interventions as needed     HPI: Kathleen Vang is a very pleasant 53 y.o. female here for  colonoscopy for evaluation of chronic diarrhea. Please refer to office visit note by Delon Failing for additional details  The risks and benefits as well as alternatives of endoscopic procedure(s) have been discussed and reviewed. All questions answered. The patient agrees to proceed.    Past Medical History:  Diagnosis Date   Anemia    Arthritis    Complication of anesthesia    GERD (gastroesophageal reflux disease)    Heart murmur    Herpes genitalis in women 2010   Lactose intolerance    MRSA infection greater than 3 months ago 2009   PONV (postoperative nausea and vomiting)    Sarcoid 1993   in remission per pt   SOBOE (shortness of breath on exertion)     Past Surgical History:  Procedure Laterality Date   CALCANEAL OSTEOTOMY Left 07/30/2019   Procedure: CALCANEAL OSTEOTOMY;  Surgeon: Kit Rush, MD;  Location: [facility];  Service: Orthopedics;  Laterality: Left;   COLONOSCOPY     GASTROC RECESSION EXTREMITY Left 07/30/2019   Procedure: LEFT GASTROC RECESSION;  Surgeon: Kit Rush, MD;  Location: [facility];  Service: Orthopedics;  Laterality: Left;   LYMPH NODE BIOPSY  1993   POSTERIOR TIBIAL TENDON REPAIR Left 07/30/2019   Procedure: EXCISION  LEFT ACCESSSORY NAVICULAR, POSTERIOR TIBIAL TENDON RECONSTRUCTION;  Surgeon: Kit Rush, MD;  Location: [facility];  Service: Orthopedics;  Laterality: Left;   TUBAL LIGATION  1988    Prior to Admission medications   Medication Sig Start Date End Date Taking? Authorizing Provider  doxepin  (SINEQUAN ) 10 MG/ML solution Take 0.3-1 mLs (3-10 mg total) by mouth at bedtime. 10/28/23  Yes [place], Betty G, MD   estradiol  (ESTRACE ) 1 MG tablet Take 1 tablet (1 mg total) by mouth daily. 12/24/22  Yes   lipase/protease/amylase (CREON ) 36000 UNITS CPEP capsule Take 2 capsules (72,000 Units total) by mouth 3 (three) times daily with meals. May also take 1 capsule (36,000 Units total) 2 (two) times daily with snacks as needed. 08/21/23  Yes Elsbeth Yearick V, MD  Multiple Vitamins-Minerals (MULTIVITAMIN WITH MINERALS) tablet Take by mouth.   Yes [provider]  progesterone  (PROMETRIUM ) 100 MG capsule Take 1 capsule (100 mg total) by mouth daily. 12/24/22  Yes   sodium fluoride  (PREVIDENT 5000 PLUS) 1.1 % CREA dental cream Brush on teeth for 1 minute then expectorate Patient taking differently: Place 1 Application onto teeth as needed. 10/24/21  Yes   valACYclovir  (VALTREX ) 500 MG tablet Take 1 tablet (500 mg total) by mouth once daily. 12/24/22  Yes   fluticasone  (FLONASE ) 50 MCG/ACT nasal spray Place 2 sprays into both nostrils daily. 03/09/23   Blair, Diane W, FNP  nystatin  (MYCOSTATIN /NYSTOP ) powder Apply topically to affected area 2 (two) times daily. Patient taking differently: Apply 1 Application topically 2 (two) times daily as needed. 10/27/21     triamcinolone  cream (KENALOG ) 0.1 % Apply a small amount to affected area once a day as needed Patient taking differently: Apply 1 Application topically as needed. 09/27/20     PARoxetine  (PAXIL ) 10 MG tablet Take 1 tablet (10 mg total) by mouth daily. 03/11/20 06/01/20  [place], Betty G, MD  venlafaxine  XR (EFFEXOR  XR) 37.5 MG 24 hr capsule Take 1 capsule (37.5 mg total) by mouth daily with breakfast. 12/30/19 06/01/20  [place], Betty G, MD    Current Outpatient Medications  Medication Sig Dispense Refill   doxepin  (SINEQUAN ) 10 MG/ML solution Take 0.3-1 mLs (3-10 mg total) by mouth at bedtime. 30 mL 1   estradiol  (ESTRACE ) 1 MG tablet Take 1 tablet (1 mg total) by mouth daily. 90 tablet 3   lipase/protease/amylase (CREON ) 36000 UNITS CPEP capsule Take 2  capsules (72,000 Units total) by mouth 3 (three) times daily with meals. May also take 1 capsule (36,000 Units total) 2 (two) times daily with snacks as needed. 240 capsule 2   Multiple Vitamins-Minerals (MULTIVITAMIN WITH MINERALS) tablet Take by mouth.     progesterone  (PROMETRIUM ) 100 MG capsule Take 1 capsule (100 mg total) by mouth daily. 90 capsule 3   sodium fluoride  (PREVIDENT 5000 PLUS) 1.1 % CREA dental cream Brush on teeth for 1 minute then expectorate (Patient taking differently: Place 1 Application onto teeth as needed.) 51 g 1   valACYclovir  (VALTREX ) 500 MG tablet Take 1 tablet (500 mg total) by mouth once daily. 90 tablet 3   fluticasone  (FLONASE ) 50 MCG/ACT nasal spray Place 2 sprays into both nostrils daily. 16 g 6   nystatin  (MYCOSTATIN /NYSTOP ) powder Apply topically to affected area 2 (two) times daily. (Patient taking differently: Apply 1 Application topically 2 (two) times daily as needed.) 60 g 3   triamcinolone  cream (KENALOG ) 0.1 % Apply a small amount to affected area once a day as needed (Patient taking differently: Apply 1 Application topically as needed.) 80 g 3   Current Facility-Administered Medications  Medication Dose Route Frequency Provider Last Rate Last Admin   0.9 %  sodium chloride  infusion  500 mL Intravenous Once Mintie Witherington V, MD        Allergies as of 11/21/2023 - Review Complete 11/21/2023  Allergen Reaction Noted   Latex Hives and Itching 09/05/2018   Amoxicillin Hives    Codeine  Nausea And Vomiting 05/14/2013   Penicillins Hives     Family History  Problem Relation Age of Onset   Other Mother    Cancer Mother    Hypertension Father    Stroke Father    Alcoholism Father    Colon cancer Neg Hx    Colon polyps Neg Hx    Esophageal cancer Neg Hx    Rectal cancer Neg Hx    Stomach cancer Neg Hx     Social History   Socioeconomic History   Marital status: Single    Spouse name: Not on file   Number of children: 2   Years of  education: Not on file   Highest education level: GED or equivalent  Occupational History   Occupation: Psychologist, sport and exercise  Tobacco Use   Smoking status: Some Days    Current packs/day: 0.50    Average packs/day: 0.5 packs/day for 39.4 years (19.7 ttl pk-yrs)    Types: Cigarettes    Start date: 07/02/1984   Smokeless tobacco: Never   Tobacco comments:    10 cig/day  Vaping Use   Vaping status: Every Day   Substances: Nicotine, Flavoring  Substance and Sexual Activity   Alcohol use: Yes    Alcohol/week: 6.0 standard drinks of alcohol    Types: 6 Cans of beer per week    Comment: 1-2 beer    Drug use: No   Sexual activity: Yes    Birth control/protection:  None, Surgical  Other Topics Concern   Not on file  Social History Narrative   Not on file   Social Drivers of Health   Financial Resource Strain: Not on file  Food Insecurity: Not on file  Transportation Needs: Not on file  Physical Activity: Not on file  Stress: Not on file  Social Connections: Not on file  Intimate Partner Violence: Not on file    Review of Systems:  All other review of systems negative except as mentioned in the HPI.  Physical Exam: Vital signs in last 24 hours: BP 129/86   Pulse 82   Temp 98.1 F (36.7 C)   Ht 5' 2 (1.575 m)   Wt 167 lb (75.8 kg)   LMP  (LMP Unknown)   SpO2 97%   BMI 30.54 kg/m  General:   Alert, NAD Lungs:  Clear .   Heart:  Regular rate and rhythm Abdomen:  Soft, nontender and nondistended. Neuro/Psych:  Alert and cooperative. Normal mood and affect. A and O x 3  Reviewed labs, radiology imaging, old records and pertinent past GI work up  Patient is appropriate for planned procedure(s) and anesthesia in an ambulatory setting   K. Veena Yarethzi Branan , MD [phone]

## 2023-11-21 NOTE — Progress Notes (Signed)
 Called to room to assist during endoscopic procedure.  Patient ID and intended procedure confirmed with present staff. Received instructions for my participation in the procedure from the performing physician.

## 2023-11-22 ENCOUNTER — Telehealth: Payer: Self-pay

## 2023-11-22 NOTE — Telephone Encounter (Signed)
  Follow up Call-     11/21/2023    2:26 PM  Call back number  Post procedure Call Back phone  # [phone]  Permission to leave phone message Yes     Patient questions:  Do you have a fever, pain , or abdominal swelling? No. Pain Score  0 *  Have you tolerated food without any problems? Yes.    Have you been able to return to your normal activities? Yes.    Do you have any questions about your discharge instructions: Diet   No. Medications  No. Follow up visit  No.  Do you have questions or concerns about your Care? No.  Actions: * If pain score is 4 or above: No action needed, pain <4.

## 2023-11-26 LAB — SURGICAL PATHOLOGY

## 2023-12-02 ENCOUNTER — Telehealth: Payer: Self-pay | Admitting: Gastroenterology

## 2023-12-02 NOTE — Telephone Encounter (Signed)
 The pt has been advised that Dr Shila has not reviewed the report as of this morning and we will call her as soon as able.

## 2023-12-02 NOTE — Telephone Encounter (Signed)
 Inbound call from patient requesting to speak to the nurse in regards to her pathology report from her procedure that was on September the 18 th. Patient is wanting to what was found and was is needing to be doing going forwarded. Please advise.

## 2023-12-13 ENCOUNTER — Other Ambulatory Visit (HOSPITAL_COMMUNITY): Payer: Self-pay

## 2023-12-13 ENCOUNTER — Ambulatory Visit: Payer: Self-pay | Admitting: Gastroenterology

## 2023-12-13 ENCOUNTER — Telehealth: Payer: Self-pay | Admitting: Gastroenterology

## 2023-12-13 DIAGNOSIS — K52832 Lymphocytic colitis: Secondary | ICD-10-CM

## 2023-12-13 MED ORDER — BUDESONIDE 3 MG PO CPEP
ORAL_CAPSULE | ORAL | 1 refills | Status: AC
  Filled 2023-12-13: qty 90, 30d supply, fill #0
  Filled 2024-01-14: qty 90, 30d supply, fill #1

## 2023-12-13 NOTE — Telephone Encounter (Signed)
 Called patient and discussed results consistent with lymphocytic colitis.  Advised her to avoid NSAIDs, she has been taking frequent NSAIDs, Excedrin for headaches. Recall colonoscopy in 5 years Follow-up in office visit in 1 to 2 months with Dr. Emori Mumme

## 2023-12-24 ENCOUNTER — Other Ambulatory Visit (HOSPITAL_COMMUNITY): Payer: Self-pay

## 2023-12-27 ENCOUNTER — Other Ambulatory Visit (HOSPITAL_COMMUNITY): Payer: Self-pay

## 2024-01-07 ENCOUNTER — Other Ambulatory Visit (HOSPITAL_COMMUNITY): Payer: Self-pay

## 2024-01-07 DIAGNOSIS — Z1231 Encounter for screening mammogram for malignant neoplasm of breast: Secondary | ICD-10-CM | POA: Diagnosis not present

## 2024-01-07 DIAGNOSIS — N951 Menopausal and female climacteric states: Secondary | ICD-10-CM | POA: Diagnosis not present

## 2024-01-07 DIAGNOSIS — A609 Anogenital herpesviral infection, unspecified: Secondary | ICD-10-CM | POA: Diagnosis not present

## 2024-01-07 DIAGNOSIS — Z8742 Personal history of other diseases of the female genital tract: Secondary | ICD-10-CM | POA: Diagnosis not present

## 2024-01-07 DIAGNOSIS — Z01419 Encounter for gynecological examination (general) (routine) without abnormal findings: Secondary | ICD-10-CM | POA: Diagnosis not present

## 2024-01-07 MED ORDER — VALACYCLOVIR HCL 500 MG PO TABS
500.0000 mg | ORAL_TABLET | Freq: Every day | ORAL | 3 refills | Status: AC
  Filled 2024-03-23: qty 90, 90d supply, fill #0

## 2024-01-07 MED ORDER — ESTRADIOL 1 MG PO TABS
1.0000 mg | ORAL_TABLET | Freq: Every day | ORAL | 3 refills | Status: AC
  Filled 2024-02-05: qty 90, 90d supply, fill #0

## 2024-01-07 MED ORDER — PROGESTERONE MICRONIZED 100 MG PO CAPS
100.0000 mg | ORAL_CAPSULE | Freq: Every day | ORAL | 3 refills | Status: AC
  Filled 2024-02-05: qty 90, 90d supply, fill #0

## 2024-01-08 ENCOUNTER — Other Ambulatory Visit (HOSPITAL_COMMUNITY): Payer: Self-pay

## 2024-01-16 ENCOUNTER — Other Ambulatory Visit (HOSPITAL_COMMUNITY): Payer: Self-pay

## 2024-01-16 ENCOUNTER — Other Ambulatory Visit: Payer: Self-pay | Admitting: Gastroenterology

## 2024-01-16 ENCOUNTER — Other Ambulatory Visit: Payer: Self-pay

## 2024-01-16 MED ORDER — PANCRELIPASE (LIP-PROT-AMYL) 36000-114000 UNITS PO CPEP
ORAL_CAPSULE | ORAL | 2 refills | Status: AC
  Filled 2024-01-16: qty 200, 25d supply, fill #0
  Filled 2024-03-06: qty 200, 25d supply, fill #1

## 2024-01-17 ENCOUNTER — Other Ambulatory Visit (HOSPITAL_COMMUNITY): Payer: Self-pay

## 2024-02-05 ENCOUNTER — Other Ambulatory Visit (HOSPITAL_COMMUNITY): Payer: Self-pay

## 2024-02-11 NOTE — Progress Notes (Signed)
 Chief Complaint: Follow up lymphocytic colitis  HPI:  Kathleen Vang is a 54 year old African-American female, known to Dr. Shila, with a past medical history as listed below including GERD, anemia and arthritis as well as complication of anesthesia who presents to clinic for follow-up of lymphocytic colitis.    11/05/2018 colonoscopy for screening of mild diverticulosis, sigmoid descending colon and nonbleeding internal hemorrhoids otherwise normal-repeat recommended 10 years    08/14/2022 Giardia, celiac and thyroid  unremarkable, fecal elastase 154 and started on Creon  36,002 pills with meals    08/19/2023 patient seen in clinic by Alan Coombs, PA-C.  At that time discussed that Creon  had helped some, continued to have diarrhea primarily in the morning after eating and worse after greasy foods.  Loose 4 stools a day.  No abdominal pain or weight loss.  That time recommended a fecal fat and CT abdomen pelvis.  Recommended stopping alcohol use.  Discussed Xifaxan  for IBS/SIBO.  Recommended fiber.  Repeat colon if needed.    08/19/2023 fecal fat normal.    09/01/2023 CTAP with contrast showed no acute findings, small benign left adrenal adenoma.  Small uterine fibroids.    10/22/2023 office visit need to discuss loose stools.  Discussed that she took Xifaxan  with no change at all.  Certain foods made diarrhea worse.  At that time scheduled for diagnostic colonoscopy with Dr. Shila.  Continued on Creon .    11/21/2023 colonoscopy with normal mucosa in the entire colon, diverticulosis in the sigmoid colon nonbleeding external and internal hemorrhoids.  Pathology showed lymphocytic colitis.  Repeat recommended 10 years for surveillance.    12/13/2023 patient called and discussed lymphocytic colitis with Dr. Nandigam.  She was advised to avoid NSAIDs.  Sent a prescription for Budesonide  9 mg daily x 30 days, 6 mg x 15 days and 3 mg x 15 days.  Recall colonoscopy placed for 5 years.  Told to follow-up in a  couple of months.    Today, patient presents to clinic and tells me that she is 90% better.  She is still on Budesonide  3 mg and has about 5 days left.  Describes that the only time she gets diarrhea now is if she eats something spicy or greasy or Mexican food.  Then she will have 1 episode of diarrhea which is not urgent and has solid stools the rest of the time.  She is very happy with the results.  Does ask some questions about the diagnosis, but in general doing very well.    Nuys fever, chills or weight loss.      Past Medical History:  Diagnosis Date   Anemia    Arthritis    Complication of anesthesia    GERD (gastroesophageal reflux disease)    Heart murmur    Herpes genitalis in women 2010   Lactose intolerance    MRSA infection greater than 3 months ago 2009   PONV (postoperative nausea and vomiting)    Sarcoid 1993   in remission per pt   SOBOE (shortness of breath on exertion)     Past Surgical History:  Procedure Laterality Date   CALCANEAL OSTEOTOMY Left 07/30/2019   Procedure: CALCANEAL OSTEOTOMY;  Surgeon: Kit Rush, MD;  Location: [facility];  Service: Orthopedics;  Laterality: Left;   COLONOSCOPY     GASTROC RECESSION EXTREMITY Left 07/30/2019   Procedure: LEFT GASTROC RECESSION;  Surgeon: Kit Rush, MD;  Location: [facility];  Service: Orthopedics;  Laterality: Left;   LYMPH  NODE BIOPSY  1993   POSTERIOR TIBIAL TENDON REPAIR Left 07/30/2019   Procedure: EXCISION  LEFT ACCESSSORY NAVICULAR, POSTERIOR TIBIAL TENDON RECONSTRUCTION;  Surgeon: Kit Rush, MD;  Location: [facility];  Service: Orthopedics;  Laterality: Left;   TUBAL LIGATION  1988    Current Outpatient Medications  Medication Sig Dispense Refill   budesonide  (ENTOCORT EC ) 3 MG 24 hr capsule Take 3 capsules (9 mg total) by mouth daily for 30 days, THEN 2 capsules (6 mg total) daily for 15 days, THEN 1 capsule (3 mg total) daily for 15 days, then STOP.  90 capsule 1   doxepin  (SINEQUAN ) 10 MG/ML solution Take 0.3-1 mLs (3-10 mg total) by mouth at bedtime. 30 mL 1   estradiol  (ESTRACE ) 1 MG tablet Take 1 tablet (1 mg total) by mouth daily. 90 tablet 3   fluticasone  (FLONASE ) 50 MCG/ACT nasal spray Place 2 sprays into both nostrils daily. 16 g 6   lipase/protease/amylase (CREON ) 36000 UNITS CPEP capsule Take 2 capsules (72,000 Units total) by mouth 3 (three) times daily with meals. May also take 1 capsule (36,000 Units total) 2 (two) times daily with snacks as needed. 200 capsule 2   Multiple Vitamins-Minerals (MULTIVITAMIN WITH MINERALS) tablet Take by mouth.     nystatin  (MYCOSTATIN /NYSTOP ) powder Apply topically to affected area 2 (two) times daily. (Patient taking differently: Apply 1 Application topically 2 (two) times daily as needed.) 60 g 3   progesterone  (PROMETRIUM ) 100 MG capsule Take 1 capsule (100 mg total) by mouth daily. 90 capsule 3   sodium fluoride  (PREVIDENT 5000 PLUS) 1.1 % CREA dental cream Brush on teeth for 1 minute then expectorate (Patient taking differently: Place 1 Application onto teeth as needed.) 51 g 1   triamcinolone  cream (KENALOG ) 0.1 % Apply a small amount to affected area once a day as needed (Patient taking differently: Apply 1 Application topically as needed.) 80 g 3   valACYclovir  (VALTREX ) 500 MG tablet Take 1 tablet (500 mg total) by mouth daily. 90 tablet 3   No current facility-administered medications for this visit.    Allergies as of 02/18/2024 - Review Complete 11/21/2023  Allergen Reaction Noted   Latex Hives and Itching 09/05/2018   Amoxicillin Hives    Codeine  Nausea And Vomiting 05/14/2013   Penicillins Hives     Family History  Problem Relation Age of Onset   Other Mother    Cancer Mother    Hypertension Father    Stroke Father    Alcoholism Father    Colon cancer Neg Hx    Colon polyps Neg Hx    Esophageal cancer Neg Hx    Rectal cancer Neg Hx    Stomach cancer Neg Hx     Social  History   Socioeconomic History   Marital status: Single    Spouse name: Not on file   Number of children: 2   Years of education: Not on file   Highest education level: GED or equivalent  Occupational History   Occupation: Psychologist, [employer]  Tobacco Use   Smoking status: Some Days    Current packs/day: 0.50    Average packs/day: 0.5 packs/day for 39.6 years (19.8 ttl pk-yrs)    Types: Cigarettes    Start date: 07/02/1984   Smokeless tobacco: Never   Tobacco comments:    10 cig/day  Vaping Use   Vaping status: Every Day   Substances: Nicotine, Flavoring  Substance and Sexual Activity   Alcohol use: Yes  Alcohol/week: 6.0 standard drinks of alcohol    Types: 6 Cans of beer per week    Comment: 1-2 beer    Drug use: No   Sexual activity: Yes    Birth control/protection: None, Surgical  Other Topics Concern   Not on file  Social History Narrative   Not on file   Social Drivers of Health   Financial Resource Strain: Not on file  Food Insecurity: Not on file  Transportation Needs: Not on file  Physical Activity: Not on file  Stress: Not on file  Social Connections: Not on file  Intimate Partner Violence: Not on file    Review of Systems:    Constitutional: No weight loss, fever or chills Cardiovascular: No chest pain Respiratory: No SOB  Gastrointestinal: See HPI and otherwise negative   Physical Exam:  Vital signs: BP (!) 140/70   Pulse 81   Ht 5' 2.5 (1.588 m)   Wt 164 lb (74.4 kg)   LMP  (LMP Unknown)   BMI 29.52 kg/m    Constitutional:   Pleasant AA female appears to be in NAD, Well developed, Well nourished, alert and cooperative Respiratory: Respirations even and unlabored. Lungs clear to auscultation bilaterally.   No wheezes, crackles, or rhonchi.  Cardiovascular: Normal S1, S2. No MRG. Regular rate and rhythm. No peripheral edema, cyanosis or pallor.  Gastrointestinal:  Soft, nondistended, nontender. No rebound or guarding. Normal bowel sounds. No  appreciable masses or hepatomegaly. Rectal:  Not performed.  Psychiatric: Demonstrates good judgement and reason without abnormal affect or behaviors.  See HPI for recent procedures.  Assessment: 1.  Lymphocytic colitis: Recent colonoscopy in September with biopsy showing lymphocytic colitis and patient treated with Budesonide  taper, currently on 3 mg daily and symptoms are 90% better, she has about 5 days left of treatment, doing well 2.  Pulmonary sarcoidosis: Currently in remission per the patient, continues to smoke 3.  Possible pancreatic insufficiency: Patient maintained on Creon   Plan: 1.  Discussed with patient that she should finish her Budesonide  taper.  If her symptoms return after finishing then she needs to contact us  and let us  know.  She may require a prolonged taper or low-dose budesonide  for a longer period of time. 2.  Answered all the patient's questions and went through the pathophysiology of lymphocytic colitis with her. 3.  Did discuss that she should stay on Creon  going forward, in the future I guess could trial coming off of this medicine as her pancreatic insufficiency was never truly diagnosed.  Will wait to adjust this so as not to throw her balance off at the moment. 4.  Patient to follow in clinic with us  as needed.  Delon Failing, PA-C [facility] 02/11/2024, 2:32 PM  Cc: Jordan, Betty G, MD

## 2024-02-17 ENCOUNTER — Ambulatory Visit: Admitting: Physician Assistant

## 2024-02-18 ENCOUNTER — Ambulatory Visit: Admitting: Physician Assistant

## 2024-02-18 ENCOUNTER — Encounter: Payer: Self-pay | Admitting: Physician Assistant

## 2024-02-18 VITALS — BP 140/70 | HR 81 | Ht 62.5 in | Wt 164.0 lb

## 2024-02-18 DIAGNOSIS — K52832 Lymphocytic colitis: Secondary | ICD-10-CM

## 2024-02-18 NOTE — Patient Instructions (Signed)
 Finish Budesonide  taper.

## 2024-03-17 ENCOUNTER — Ambulatory Visit: Admitting: Orthopaedic Surgery

## 2024-03-17 ENCOUNTER — Encounter: Payer: Self-pay | Admitting: Orthopaedic Surgery

## 2024-03-17 DIAGNOSIS — M25531 Pain in right wrist: Secondary | ICD-10-CM | POA: Diagnosis not present

## 2024-03-17 MED ORDER — LIDOCAINE HCL 1 % IJ SOLN
0.3000 mL | INTRAMUSCULAR | Status: AC | PRN
  Administered 2024-03-17: .3 mL

## 2024-03-17 MED ORDER — METHYLPREDNISOLONE ACETATE 40 MG/ML IJ SUSP
13.3300 mg | INTRAMUSCULAR | Status: AC | PRN
  Administered 2024-03-17: 13.33 mg

## 2024-03-17 MED ORDER — BUPIVACAINE HCL 0.5 % IJ SOLN
0.3300 mL | INTRAMUSCULAR | Status: AC | PRN
  Administered 2024-03-17: .33 mL

## 2024-03-17 NOTE — Progress Notes (Signed)
 "  Office Visit Note   Patient: Kathleen Vang           Date of Birth: Nov 11, 1970           MRN: [id number] Visit Date: 03/17/2024              Requested by: Jordan, Betty G, MD [address] [place],  [place] [address] PCP: Jordan, Betty G, MD   Assessment & Plan: Visit Diagnoses:  1. Right wrist pain     Plan: History of Present Illness Kathleen Vang is a 54 year old female with recurrent ganglion cyst of the right first dorsal compartment who presents with increased size and tenderness of the cyst.  The cyst, first noted as very small in 2024, has enlarged significantly over the past two weeks with intermittent symptoms.  She has marked tenderness over the area, with pain worst upon release of pressure. She denies other symptoms.  She previously received a corticosteroid injection and used a brace for this cyst.  Physical Exam MUSCULOSKELETAL: Ganglion cyst on first dorsal compartment of the wrist with tenderness.  Positive Finkelstein's.  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 Surgical excision discussed as definitive treatment. - Recommended continued use of a wrist brace. - Discussed surgical excision as a definitive option if conservative management fails. - Patient opted for repeat steroid injection today.  Follow-Up Instructions: Return if symptoms worsen or fail to improve.   Orders:  Orders Placed This Encounter  Procedures   Hand/UE Inj   No orders of the defined types were placed in this encounter.     Procedures: Hand/UE Inj: R extensor compartment 1 for de Quervain's tenosynovitis on 03/17/2024 5:48 PM Indications: pain Details: 25 G needle Medications: 0.3 mL lidocaine  1 %; 0.33 mL bupivacaine  0.5 %; 13.33 mg methylPREDNISolone  acetate 40 MG/ML Outcome: tolerated well, no immediate  complications Patient was prepped and draped in the usual sterile fashion.       Clinical Data: No additional findings.   Subjective: Chief Complaint  Patient presents with   Right Wrist - Pain    HPI  Review of Systems  Constitutional: Negative.   HENT: Negative.    Eyes: Negative.   Respiratory: Negative.    Cardiovascular: Negative.   Endocrine: Negative.   Musculoskeletal: Negative.   Neurological: Negative.   Hematological: Negative.   Psychiatric/Behavioral: Negative.    All other systems reviewed and are negative.    Objective: Vital Signs: LMP  (LMP Unknown)   Physical Exam Vitals and nursing note reviewed.  Constitutional:      Appearance: She is well-developed.  HENT:     Head: Normocephalic and atraumatic.  Pulmonary:     Effort: Pulmonary effort is normal.  Abdominal:     Palpations: Abdomen is soft.  Musculoskeletal:     Cervical back: Neck supple.  Skin:    General: Skin is warm.     Capillary Refill: Capillary refill takes less than 2 seconds.  Neurological:     Mental Status: She is alert and oriented to person, place, and time.  Psychiatric:        Behavior: Behavior normal.        Thought Content: Thought content normal.        Judgment: Judgment normal.     Ortho Exam  Specialty Comments:  No specialty comments available.  Imaging: No results found.  PMFS History: Patient Active Problem List   Diagnosis Date Noted   Hyperlipidemia 10/03/2022   Routine general medical examination at a health care facility 10/03/2022   Chronic pruritic rash in adult 09/30/2020   Lactose intolerance 06/01/2020   Other fatigue 06/01/2020   SOBOE (shortness of breath on exertion) 06/01/2020   Elevated blood pressure reading 06/01/2020   At risk for impaired metabolic function 06/01/2020   Class 1 obesity with body mass index (BMI) of 30.0 to 30.9 in adult 07/07/2019   GOITER, UNSPECIFIED 04/28/2007   DIZZINESS 04/28/2007   TONSILLAR  HYPERTROPHY, UNILATERAL 08/27/2006   SARCOIDOSIS 04/03/2006   ANEMIA NOS 04/03/2006   TOBACCO ABUSE 04/03/2006   HEADACHE, TENSION 04/03/2006   DISORDER, DEPRESSIVE NEC 04/03/2006   HIATAL HERNIA WITH REFLUX 04/03/2006   EPICONDYLITIS, LATERAL 04/03/2006   INSOMNIA 04/03/2006   HELICOBACTER PYLORI GASTRITIS, HX OF 04/03/2006   Past Medical History:  Diagnosis Date   Anemia    Arthritis    Complication of anesthesia    GERD (gastroesophageal reflux disease)    Heart murmur    Herpes genitalis in women 2010   Lactose intolerance    MRSA infection greater than 3 months ago 2009   PONV (postoperative nausea and vomiting)    Sarcoid 1993   in remission per pt   SOBOE (shortness of breath on exertion)     Family History  Problem Relation Age of Onset   Other Mother    Cancer Mother    Hypertension Father    Stroke Father    Alcoholism Father    Colon cancer Neg Hx    Colon polyps Neg Hx    Esophageal cancer Neg Hx    Rectal cancer Neg Hx    Stomach cancer Neg Hx     Past Surgical History:  Procedure Laterality Date   CALCANEAL OSTEOTOMY Left 07/30/2019   Procedure: CALCANEAL OSTEOTOMY;  Surgeon: Kit Rush, MD;  Location: [facility];  Service: Orthopedics;  Laterality: Left;   COLONOSCOPY     GASTROC RECESSION EXTREMITY Left 07/30/2019   Procedure: LEFT GASTROC RECESSION;  Surgeon: Kit Rush, MD;  Location: [facility];  Service: Orthopedics;  Laterality: Left;   LYMPH NODE BIOPSY  1993   POSTERIOR TIBIAL TENDON REPAIR Left 07/30/2019   Procedure: EXCISION  LEFT ACCESSSORY NAVICULAR, POSTERIOR TIBIAL TENDON RECONSTRUCTION;  Surgeon: Kit Rush, MD;  Location: [facility];  Service: Orthopedics;  Laterality: Left;   TUBAL LIGATION  1988   Social History   Occupational History   Occupation: Psychologist, [employer]  Tobacco Use   Smoking status: Some Days    Current packs/day: 0.50    Average packs/day: 0.5 packs/day for 39.7 years  (19.9 ttl pk-yrs)    Types: Cigarettes    Start date: 07/02/1984   Smokeless tobacco: Never   Tobacco comments:    10 cig/day  Vaping Use   Vaping status: Every Day   Substances: Nicotine, Flavoring  Substance and Sexual Activity   Alcohol use: Yes    Alcohol/week: 6.0 standard drinks of alcohol    Types: 6 Cans of beer per week    Comment: 1-2 beer    Drug use: No   Sexual activity: Yes    Birth control/protection: None, Surgical        "

## 2024-03-23 ENCOUNTER — Other Ambulatory Visit (HOSPITAL_COMMUNITY): Payer: Self-pay

## 2024-03-23 ENCOUNTER — Other Ambulatory Visit: Payer: Self-pay
